# Patient Record
Sex: Female | Born: 1950
Health system: Southern US, Community
[De-identification: ages and names within clinical notes are randomized; demographics above are authoritative.]

## PROBLEM LIST (undated history)

## (undated) DIAGNOSIS — M3500 Sicca syndrome, unspecified: Secondary | ICD-10-CM

## (undated) DIAGNOSIS — E785 Hyperlipidemia, unspecified: Secondary | ICD-10-CM

## (undated) DIAGNOSIS — I10 Essential (primary) hypertension: Secondary | ICD-10-CM

## (undated) DIAGNOSIS — E669 Obesity, unspecified: Secondary | ICD-10-CM

## (undated) DIAGNOSIS — M25579 Pain in unspecified ankle and joints of unspecified foot: Secondary | ICD-10-CM

## (undated) HISTORY — DX: Hyperlipidemia, unspecified: E78.5

## (undated) HISTORY — PX: SPLENECTOMY, TOTAL: SHX788

## (undated) HISTORY — DX: Essential (primary) hypertension: I10

## (undated) HISTORY — PX: ABDOMINAL HYSTERECTOMY: SHX81

## (undated) HISTORY — PX: ABDOMINAL SURGERY: SHX537

## (undated) HISTORY — DX: Sjogren syndrome, unspecified: M35.00

## (undated) HISTORY — DX: Obesity, unspecified: E66.9

## (undated) HISTORY — PX: CHOLECYSTECTOMY: SHX55

---

## 1998-07-01 ENCOUNTER — Ambulatory Visit (HOSPITAL_COMMUNITY): Admission: RE | Admit: 1998-07-01 | Discharge: 1998-07-01 | Payer: Self-pay | Admitting: Obstetrics and Gynecology

## 1999-08-21 ENCOUNTER — Encounter: Payer: Self-pay | Admitting: Obstetrics and Gynecology

## 1999-08-21 ENCOUNTER — Ambulatory Visit (HOSPITAL_COMMUNITY): Admission: RE | Admit: 1999-08-21 | Discharge: 1999-08-21 | Payer: Self-pay | Admitting: Obstetrics and Gynecology

## 1999-08-26 ENCOUNTER — Encounter: Payer: Self-pay | Admitting: Obstetrics and Gynecology

## 1999-08-26 ENCOUNTER — Ambulatory Visit (HOSPITAL_COMMUNITY): Admission: RE | Admit: 1999-08-26 | Discharge: 1999-08-26 | Payer: Self-pay | Admitting: Obstetrics and Gynecology

## 1999-10-08 ENCOUNTER — Other Ambulatory Visit: Admission: RE | Admit: 1999-10-08 | Discharge: 1999-10-08 | Payer: Self-pay | Admitting: Obstetrics and Gynecology

## 1999-10-09 ENCOUNTER — Other Ambulatory Visit: Admission: RE | Admit: 1999-10-09 | Discharge: 1999-10-09 | Payer: Self-pay | Admitting: Obstetrics and Gynecology

## 1999-10-09 ENCOUNTER — Encounter (INDEPENDENT_AMBULATORY_CARE_PROVIDER_SITE_OTHER): Payer: Self-pay | Admitting: Specialist

## 2002-01-10 ENCOUNTER — Ambulatory Visit (HOSPITAL_COMMUNITY): Admission: RE | Admit: 2002-01-10 | Discharge: 2002-01-10 | Payer: Self-pay | Admitting: Family Medicine

## 2002-01-24 ENCOUNTER — Encounter: Payer: Self-pay | Admitting: Obstetrics and Gynecology

## 2002-01-24 ENCOUNTER — Ambulatory Visit (HOSPITAL_COMMUNITY): Admission: RE | Admit: 2002-01-24 | Discharge: 2002-01-24 | Payer: Self-pay | Admitting: Obstetrics and Gynecology

## 2002-07-24 ENCOUNTER — Other Ambulatory Visit: Admission: RE | Admit: 2002-07-24 | Discharge: 2002-07-24 | Payer: Self-pay | Admitting: Obstetrics and Gynecology

## 2003-06-25 ENCOUNTER — Ambulatory Visit (HOSPITAL_COMMUNITY): Admission: RE | Admit: 2003-06-25 | Discharge: 2003-06-25 | Payer: Self-pay | Admitting: Obstetrics and Gynecology

## 2003-09-10 ENCOUNTER — Other Ambulatory Visit: Admission: RE | Admit: 2003-09-10 | Discharge: 2003-09-10 | Payer: Self-pay | Admitting: Obstetrics and Gynecology

## 2004-07-01 ENCOUNTER — Ambulatory Visit (HOSPITAL_COMMUNITY): Admission: RE | Admit: 2004-07-01 | Discharge: 2004-07-01 | Payer: Self-pay | Admitting: Obstetrics and Gynecology

## 2004-07-18 ENCOUNTER — Ambulatory Visit: Payer: Self-pay | Admitting: Internal Medicine

## 2004-07-30 ENCOUNTER — Ambulatory Visit: Payer: Self-pay | Admitting: Internal Medicine

## 2004-11-05 ENCOUNTER — Other Ambulatory Visit: Admission: RE | Admit: 2004-11-05 | Discharge: 2004-11-05 | Payer: Self-pay | Admitting: Obstetrics and Gynecology

## 2005-07-18 ENCOUNTER — Emergency Department (HOSPITAL_COMMUNITY): Admission: EM | Admit: 2005-07-18 | Discharge: 2005-07-18 | Payer: Self-pay | Admitting: Family Medicine

## 2005-07-30 ENCOUNTER — Ambulatory Visit (HOSPITAL_COMMUNITY): Admission: RE | Admit: 2005-07-30 | Discharge: 2005-07-30 | Payer: Self-pay | Admitting: Obstetrics and Gynecology

## 2006-02-04 ENCOUNTER — Other Ambulatory Visit: Admission: RE | Admit: 2006-02-04 | Discharge: 2006-02-04 | Payer: Self-pay | Admitting: Obstetrics and Gynecology

## 2006-05-19 ENCOUNTER — Ambulatory Visit: Payer: Self-pay | Admitting: Family Medicine

## 2006-05-19 LAB — CONVERTED CEMR LAB
ALT: 23 units/L (ref 0–40)
AST: 21 units/L (ref 0–37)
BUN: 22 mg/dL (ref 6–23)
CO2: 32 meq/L (ref 19–32)
Calcium: 9.7 mg/dL (ref 8.4–10.5)
Chloride: 104 meq/L (ref 96–112)
Chol/HDL Ratio, serum: 3.2
Cholesterol: 170 mg/dL (ref 0–200)
Creatinine, Ser: 1 mg/dL (ref 0.4–1.2)
GFR calc non Af Amer: 62 mL/min
Glomerular Filtration Rate, Af Am: 75 mL/min/{1.73_m2}
Glucose, Bld: 108 mg/dL — ABNORMAL HIGH (ref 70–99)
HDL: 52.7 mg/dL (ref >39.0)
LDL Cholesterol: 100 mg/dL — ABNORMAL HIGH (ref 0–99)
Potassium: 4 meq/L (ref 3.5–5.1)
Sodium: 141 meq/L (ref 135–145)
Triglyceride fasting, serum: 89 mg/dL (ref 0–149)
VLDL: 18 mg/dL (ref 0–40)

## 2006-08-23 ENCOUNTER — Ambulatory Visit: Payer: Self-pay | Admitting: Family Medicine

## 2006-08-23 LAB — CONVERTED CEMR LAB
ALT: 24 units/L (ref 0–40)
AST: 22 units/L (ref 0–37)
BUN: 22 mg/dL (ref 6–23)
CO2: 30 meq/L (ref 19–32)
Calcium: 9.3 mg/dL (ref 8.4–10.5)
Chloride: 101 meq/L (ref 96–112)
Cholesterol: 167 mg/dL (ref 0–200)
Creatinine, Ser: 0.9 mg/dL (ref 0.4–1.2)
GFR calc Af Amer: 84 mL/min
GFR calc non Af Amer: 70 mL/min
Glucose, Bld: 111 mg/dL — ABNORMAL HIGH (ref 70–99)
HDL: 57.2 mg/dL (ref >39.0)
LDL Cholesterol: 88 mg/dL (ref 0–99)
Potassium: 3.5 meq/L (ref 3.5–5.1)
Sodium: 138 meq/L (ref 135–145)
Total CHOL/HDL Ratio: 2.9
Triglycerides: 108 mg/dL (ref 0–149)
VLDL: 22 mg/dL (ref 0–40)

## 2006-08-30 ENCOUNTER — Ambulatory Visit (HOSPITAL_COMMUNITY): Admission: RE | Admit: 2006-08-30 | Discharge: 2006-08-30 | Payer: Self-pay | Admitting: Obstetrics and Gynecology

## 2006-10-04 ENCOUNTER — Ambulatory Visit: Payer: Self-pay | Admitting: Family Medicine

## 2007-02-24 ENCOUNTER — Ambulatory Visit: Payer: Self-pay | Admitting: Family Medicine

## 2007-02-24 DIAGNOSIS — I10 Essential (primary) hypertension: Secondary | ICD-10-CM | POA: Insufficient documentation

## 2007-02-24 DIAGNOSIS — E785 Hyperlipidemia, unspecified: Secondary | ICD-10-CM | POA: Insufficient documentation

## 2007-02-25 ENCOUNTER — Encounter (INDEPENDENT_AMBULATORY_CARE_PROVIDER_SITE_OTHER): Payer: Self-pay | Admitting: *Deleted

## 2007-02-25 LAB — CONVERTED CEMR LAB
ALT: 23 units/L (ref 0–35)
AST: 22 units/L (ref 0–37)
BUN: 19 mg/dL (ref 6–23)
CO2: 33 meq/L — ABNORMAL HIGH (ref 19–32)
Calcium: 10.2 mg/dL (ref 8.4–10.5)
Chloride: 107 meq/L (ref 96–112)
Cholesterol: 167 mg/dL (ref 0–200)
Creatinine, Ser: 0.9 mg/dL (ref 0.4–1.2)
GFR calc Af Amer: 84 mL/min
GFR calc non Af Amer: 69 mL/min
Glucose, Bld: 90 mg/dL (ref 70–99)
HDL: 56.9 mg/dL (ref >39.0)
LDL Cholesterol: 88 mg/dL (ref 0–99)
Potassium: 4 meq/L (ref 3.5–5.1)
Sodium: 146 meq/L — ABNORMAL HIGH (ref 135–145)
Total CHOL/HDL Ratio: 2.9
Triglycerides: 109 mg/dL (ref 0–149)
VLDL: 22 mg/dL (ref 0–40)

## 2007-03-10 ENCOUNTER — Encounter: Admission: RE | Admit: 2007-03-10 | Discharge: 2007-03-10 | Payer: Self-pay | Admitting: Obstetrics and Gynecology

## 2007-06-27 ENCOUNTER — Ambulatory Visit: Payer: Self-pay | Admitting: Family Medicine

## 2007-06-27 ENCOUNTER — Telehealth (INDEPENDENT_AMBULATORY_CARE_PROVIDER_SITE_OTHER): Payer: Self-pay | Admitting: *Deleted

## 2007-06-27 ENCOUNTER — Encounter (INDEPENDENT_AMBULATORY_CARE_PROVIDER_SITE_OTHER): Payer: Self-pay | Admitting: *Deleted

## 2007-06-27 LAB — CONVERTED CEMR LAB
BUN: 20 mg/dL (ref 6–23)
CO2: 28 meq/L (ref 19–32)
Calcium: 9.5 mg/dL (ref 8.4–10.5)
Chloride: 102 meq/L (ref 96–112)
Creatinine, Ser: 1 mg/dL (ref 0.4–1.2)
GFR calc Af Amer: 74 mL/min
GFR calc non Af Amer: 61 mL/min
Glucose, Bld: 114 mg/dL — ABNORMAL HIGH (ref 70–99)
Potassium: 3.6 meq/L (ref 3.5–5.1)
Sodium: 140 meq/L (ref 135–145)

## 2007-09-14 ENCOUNTER — Ambulatory Visit: Payer: Self-pay | Admitting: Family Medicine

## 2007-09-20 ENCOUNTER — Encounter (INDEPENDENT_AMBULATORY_CARE_PROVIDER_SITE_OTHER): Payer: Self-pay | Admitting: *Deleted

## 2007-09-20 LAB — CONVERTED CEMR LAB
ALT: 25 units/L (ref 0–35)
AST: 24 units/L (ref 0–37)
BUN: 17 mg/dL (ref 6–23)
CO2: 30 meq/L (ref 19–32)
Calcium: 9.9 mg/dL (ref 8.4–10.5)
Chloride: 103 meq/L (ref 96–112)
Creatinine, Ser: 1 mg/dL (ref 0.4–1.2)
GFR calc Af Amer: 74 mL/min
GFR calc non Af Amer: 61 mL/min
Glucose, Bld: 75 mg/dL (ref 70–99)
Potassium: 4.2 meq/L (ref 3.5–5.1)
Sodium: 140 meq/L (ref 135–145)

## 2007-10-05 ENCOUNTER — Encounter: Admission: RE | Admit: 2007-10-05 | Discharge: 2007-10-05 | Payer: Self-pay | Admitting: Obstetrics and Gynecology

## 2007-10-17 ENCOUNTER — Telehealth (INDEPENDENT_AMBULATORY_CARE_PROVIDER_SITE_OTHER): Payer: Self-pay | Admitting: *Deleted

## 2007-10-17 ENCOUNTER — Ambulatory Visit: Payer: Self-pay | Admitting: Family Medicine

## 2007-10-17 DIAGNOSIS — M25579 Pain in unspecified ankle and joints of unspecified foot: Secondary | ICD-10-CM

## 2007-10-17 HISTORY — DX: Pain in unspecified ankle and joints of unspecified foot: M25.579

## 2007-10-18 ENCOUNTER — Ambulatory Visit: Payer: Self-pay | Admitting: Family Medicine

## 2007-10-19 ENCOUNTER — Telehealth (INDEPENDENT_AMBULATORY_CARE_PROVIDER_SITE_OTHER): Payer: Self-pay | Admitting: *Deleted

## 2007-12-15 ENCOUNTER — Ambulatory Visit: Payer: Self-pay | Admitting: Internal Medicine

## 2008-01-30 ENCOUNTER — Emergency Department (HOSPITAL_COMMUNITY): Admission: EM | Admit: 2008-01-30 | Discharge: 2008-01-30 | Payer: Self-pay | Admitting: Emergency Medicine

## 2008-02-16 ENCOUNTER — Emergency Department (HOSPITAL_COMMUNITY): Admission: EM | Admit: 2008-02-16 | Discharge: 2008-02-16 | Payer: Self-pay | Admitting: Emergency Medicine

## 2008-04-23 ENCOUNTER — Encounter (INDEPENDENT_AMBULATORY_CARE_PROVIDER_SITE_OTHER): Payer: Self-pay | Admitting: *Deleted

## 2008-07-19 ENCOUNTER — Emergency Department (HOSPITAL_COMMUNITY): Admission: EM | Admit: 2008-07-19 | Discharge: 2008-07-19 | Payer: Self-pay | Admitting: Emergency Medicine

## 2008-11-27 ENCOUNTER — Ambulatory Visit (HOSPITAL_COMMUNITY): Admission: RE | Admit: 2008-11-27 | Discharge: 2008-11-27 | Payer: Self-pay | Admitting: Obstetrics and Gynecology

## 2009-01-19 ENCOUNTER — Emergency Department (HOSPITAL_COMMUNITY): Admission: EM | Admit: 2009-01-19 | Discharge: 2009-01-19 | Payer: Self-pay | Admitting: Family Medicine

## 2010-02-26 ENCOUNTER — Ambulatory Visit (HOSPITAL_COMMUNITY): Admission: RE | Admit: 2010-02-26 | Discharge: 2010-02-26 | Payer: Self-pay | Admitting: Obstetrics and Gynecology

## 2010-08-14 NOTE — Progress Notes (Signed)
  Phone Note Outgoing Call Call back at [facility] Phone [phone]   Call placed by: Ardyth Man,  June 27, 2007 3:15 PM Call placed to: Patient Summary of Call: Left message for patient and mailed lab letter ...................................................................Ardyth Man  June 27, 2007 3:15 PM

## 2010-08-14 NOTE — Letter (Signed)
Summary: Results Follow up Letter  [facility] [place]  [address] [place], [place] [address]   Phone: [phone]  Fax: [phone]    09/20/2007 MRN: [id number]  Kristy Sherman [address] [place], [place]  [address]  Dear [name],  The following are the results of your recent test(s):  Test         Result    Pap Smear:        Normal _____  Not Normal _____ Comments: ______________________________________________________ Cholesterol: LDL(Bad cholesterol):         Your goal is less than:         HDL (Good cholesterol):       Your goal is more than: Comments:  ______________________________________________________ Mammogram:        Normal _____  Not Normal _____ Comments:  ___________________________________________________________________ Hemoccult:        Normal _____  Not normal _______ Comments:    _____________________________________________________________________ Other Tests:  LAB NORMAL!  We routinely do not discuss normal results over the telephone.  If you desire a copy of the results, or you have any questions about this information we can discuss them at your next office visit.   Sincerely,

## 2010-08-14 NOTE — Assessment & Plan Note (Signed)
Summary: 4 MONTH ROA//VGJ   Vital Signs:  Patient Profile:   60 Years Old Female Weight:      223 pounds Temp:     98.2 degrees F oral Pulse rate:   60 / minute Resp:     14 per minute BP sitting:   108 / 72  (right arm)  Pt. in pain?   no  Vitals Entered By: Ardyth Man (June 27, 2007 7:59 AM)                  Chief Complaint:  4 month follow up BP.  History of Present Illness:  Hypertension Follow-Up      This is a 60 year old woman who presents for Hypertension follow-up.  The patient denies the following associated symptoms: chest pain, chest pressure, leg edema, dizziness,  and pedal edema.  Compliance with medications (by patient report) has been near 100%.  The patient reports that dietary compliance has been excellent.  The patient reports exercising occasionally.    Hyperlipidemia Follow-Up      The patient also presents for Hyperlipidemia follow-up.  Compliance with medications (by patient report) has been near 100%.          Physical Exam  General:     overweight female in no acute distress and answers questions appropriately Neck:     No deformities, masses, or tenderness noted. Lungs:     Normal respiratory effort, chest expands symmetrically. Lungs are clear to auscultation, no crackles or wheezes. Heart:     Normal rate and regular rhythm. S1 and S2 normal without gallop, murmur, click, rub or other extra sounds. Extremities:     trace left pedal edema and trace right pedal edema.      Impression & Recommendations:  Problem # 1:  HYPERTENSION (ICD-401.9) At goal Her updated medication list for this problem includes:    Norvasc 10 Mg Tabs (Amlodipine besylate) .Marland Kitchen... Take 1 tablet by mouth once a day    Hydrochlorothiazide 25 Mg Tabs (Hydrochlorothiazide) .Marland Kitchen... Take 1 tablet by mouth once a day F/u in 3 months and prn Orders: TLB-BMP (Basic Metabolic Panel-BMET) (80048-METABOL) Venipuncture ([address])  BP today: 108/72 Prior BP:  118/80 (02/24/2007)  Labs Reviewed: Creat: 0.9 (02/24/2007) Chol: 167 (02/24/2007)   HDL: 56.9 (02/24/2007)   LDL: 88 (02/24/2007)   TG: 109 (02/24/2007)   Problem # 2:  HYPERLIPIDEMIA (ICD-272.4) Previously at goal Her updated medication list for this problem includes:    Simvastatin 20 Mg Tabs (Simvastatin) .Marland Kitchen... Take 1 tablet by mouth every night  Labs Reviewed: Chol: 167 (02/24/2007)   HDL: 56.9 (02/24/2007)   LDL: 88 (02/24/2007)   TG: 109 (02/24/2007) SGOT: 22 (02/24/2007)   SGPT: 23 (02/24/2007)   Complete Medication List: 1)  Norvasc 10 Mg Tabs (Amlodipine besylate) .... Take 1 tablet by mouth once a day 2)  Simvastatin 20 Mg Tabs (Simvastatin) .... Take 1 tablet by mouth every night 3)  Aspirin Ec 81 Mg Tbec (Aspirin) .... Take 1 tablet by mouth every morning 4)  Hydrochlorothiazide 25 Mg Tabs (Hydrochlorothiazide) .... Take 1 tablet by mouth once a day 5)  Co Q-10 Vitamin E Fish Oil 60-90-25-200 Caps (Dha-epa-coenzyme q10-vitamin e)     Prescriptions: HYDROCHLOROTHIAZIDE 25 MG TABS (HYDROCHLOROTHIAZIDE) Take 1 tablet by mouth once a day  #90 x 3   Entered and Authorized by:   Leanne Chang MD   Signed by:   Leanne Chang MD on 06/27/2007   Method used:   Print  then Give to Patient   RxID:   [id number] SIMVASTATIN 20 MG TABS (SIMVASTATIN) Take 1 tablet by mouth every night  #90 x 3   Entered and Authorized by:   Leanne Chang MD   Signed by:   Leanne Chang MD on 06/27/2007   Method used:   Print then Give to Patient   RxID:   [id number] NORVASC 10 MG TABS (AMLODIPINE BESYLATE) Take 1 tablet by mouth once a day  #90 x 3   Entered and Authorized by:   Leanne Chang MD   Signed by:   Leanne Chang MD on 06/27/2007   Method used:   Print then Give to Patient   RxID:   [id number]  ]

## 2010-08-14 NOTE — Letter (Signed)
Summary: [place] No Show Letter  [facility] [place]  [address] [place], [place] [address]   Phone: [phone]  Fax: [phone]    04/23/2008 MRN: [id number]  Kristy Sherman [address] [place], [place]  [address]   Dear Kristy Sherman,   Our records indicate that you missed your scheduled appointment with Dr. Drue Novel on 04/23/08.  Please contact this office to reschedule your appointment as soon as possible.  It is important that you keep your scheduled appointments with your physician, so we can provide you the best care possible.  Please be advised that there may be a charge for "no show" appointments.    Sincerely,   [place] [facility]

## 2010-08-14 NOTE — Letter (Signed)
Summary: Results Follow-up Letter  [name]  [address] [place], [place] [address]   Phone: [phone]  Fax: [phone]    06/27/2007        Kristy Sherman [address] [place] [place], [place]  [address]  Dear [name],   The following are the results of your recent test(s):  Test     Result     Pap Smear    Normal_______  Not Normal_____       Comments: _________________________________________________________ Cholesterol LDL(Bad cholesterol):          Your goal is less than:         HDL (Good cholesterol):        Your goal is more than: _________________________________________________________ Other Tests:   _________________________________________________________  Please call for an appointment Or _Please see attached.________________________________________________________ _________________________________________________________ _________________________________________________________  Sincerely,  Ardyth Man [place] [facility]

## 2010-08-14 NOTE — Assessment & Plan Note (Signed)
Summary: acute/ankle pain   Vital Signs:  Patient Profile:   60 Years Old Female Weight:      216.38 pounds Temp:     98.0 degrees F oral Pulse rate:   74 / minute Resp:     16 per minute BP sitting:   110 / 80  (right arm)  Pt. in pain?   yes    Location:   ankle    Intensity:   4    Type:       Pulling  Vitals Entered By: Ardyth Man (October 17, 2007 3:41 PM)                  PCP:  Laury Axon  Chief Complaint:  Left ankle pain.  History of Present Illness:  Injury      This is a 60 year old woman who presents with An injury.  The problem began duration > 3 days ago.  Pt noticed pain in L ankle for 1 1/2 weeks.  Pt was at work walking on carpet and she felt a pop in front of ankle.    No ecchymosis  + swelling.  The patient reports injury to the left ankle, but denies injury to the head, face, neck, left arm, right arm, left elbow, right elbow, left forearm, right forearm, chest, back, abdomen, left hip, right hip, left thigh, right thigh, left knee, right knee, left leg, right leg, right ankle, left foot, and right foot.  The patient also reports swelling and tenderness.  The patient denies redness, increased warmth deformity, blood loss, numbness, weakness, loss of sensation, coolness of extremity, and loss of consciousness.  The patient denies the following risk factors for significant bleeding: aspirin use, anticoagulant use, and history of bleeding disorder.--- no known injury      Current Allergies: No known allergies      Review of Systems      See HPI   Physical Exam  General:     Well-developed,well-nourished,in no acute distress; alert,appropriate and cooperative throughout examination Msk:     + swelling L ankle not warm to touch no errythema tenderness anteriorly Skin:     Intact without suspicious lesions or rashes    Impression & Recommendations:  Problem # 1:  ANKLE PAIN, LEFT (ICD-719.47) rest, elevation , ice ,  ankle  splint Orders: T-Ankle Comp Left Min 3 Views (73610TC) Ankle / Wrist Splint (A4570)-- ortho if no better  Orders: T-Ankle Comp Left Min 3 Views (73610TC) Ankle / Wrist Splint (A4570)   Complete Medication List: 1)  Norvasc 10 Mg Tabs (Amlodipine besylate) .... Take 1 tablet by mouth once a day 2)  Simvastatin 20 Mg Tabs (Simvastatin) .... Take 1 tablet by mouth every night 3)  Aspirin Ec 81 Mg Tbec (Aspirin) .... Take 1 tablet by mouth every morning 4)  Hydrochlorothiazide 25 Mg Tabs (Hydrochlorothiazide) .... Take 1 tablet by mouth once a day 5)  Co Q-10 Vitamin E Fish Oil 60-90-25-200 Caps (Dha-epa-coenzyme q10-vitamin e) 6)  Pepcid Ac 10 Mg Tabs (Famotidine) .... Take one tablet each evening     ]

## 2010-08-14 NOTE — Assessment & Plan Note (Signed)
Summary: roa 3 months,cbs   Vital Signs:  Patient Profile:   59 Years Old Female Weight:      213.6 pounds Pulse rate:   82 / minute BP sitting:   102 / 70  Vitals Entered By: Shary Decamp (December 15, 2007 8:49 AM)                 PCP:  Laury Axon  Chief Complaint:  rov - fasting; c/o swelling in ankles.  History of Present Illness: rov - fasting c/o swelling in feet at the end of the day x years on-off, more consistently x 2 -3 months on norvasc x years     Updated Prior Medication List: NORVASC 10 MG TABS (AMLODIPINE BESYLATE) Take 1 tablet by mouth once a day SIMVASTATIN 20 MG TABS (SIMVASTATIN) Take 1 tablet by mouth every night ASPIRIN EC 81 MG TBEC (ASPIRIN) Take 1 tablet by mouth every morning HYDROCHLOROTHIAZIDE 25 MG TABS (HYDROCHLOROTHIAZIDE) Take 1 tablet by mouth once a day PEPCID AC 10 MG  TABS (FAMOTIDINE) Take one tablet each evening * FISH OIL   Current Allergies (reviewed today): No known allergies   Past Medical History:    Reviewed history from 02/24/2007 and no changes required:       Hyperlipidemia       Hypertension         Past Surgical History:    Reviewed history from 02/24/2007 and no changes required:       c/s 3   Family History:    Reviewed history from 02/24/2007 and no changes required:        Asthma-- +       CAD - F       HTN - F       DM - PGM, MGF       stroke - PGM       colon Ca - MGM       breast Ca - no       uterine/ovarian Ca - no       ETOH abuse - M       F --deceased sudden death         Social History:    Reviewed history from 02/24/2007 and no changes required:       Occupation: Nurse       Married       Never Smoked       Alcohol use-yes       Drug use-no       Regular exercise-yes   Risk Factors: Tobacco use:  never Drug use:  no Alcohol use:  yes Exercise:  yes   Review of Systems  CV      Denies chest pain or discomfort.  Resp      Denies cough and shortness of breath.  GI  Denies diarrhea.   Physical Exam  General:     alert, well-developed, and overweight-appearing.   Neck:     no thyromegaly.   Lungs:     normal respiratory effort, no intercostal retractions, no accessory muscle use, and normal breath sounds.   Heart:     normal rate, regular rhythm, and no murmur.   Extremities:     no pretibial edema bilaterally  Psych:     Oriented X3, memory intact for recent and remote, normally interactive, good eye contact, not anxious appearing, and not depressed appearing.      Impression & Recommendations:  Problem # 1:  HYPERTENSION (ICD-401.9) edema  may be from norvasc pt states is not  enough to change  meds rec: low salt-compression stokings-elevate legs to call if worsen: change norvasc? Her updated medication list for this problem includes:    Norvasc 10 Mg Tabs (Amlodipine besylate) .Marland Kitchen... Take 1 tablet by mouth once a day    Hydrochlorothiazide 25 Mg Tabs (Hydrochlorothiazide) .Marland Kitchen... Take 1 tablet by mouth once a day  BP today: 102/70 Prior BP: 110/80 (10/17/2007)  Labs Reviewed: Creat: 1.0 (09/14/2007) Chol: 167 (02/24/2007)   HDL: 56.9 (02/24/2007)   LDL: 88 (02/24/2007)   TG: 109 (02/24/2007)   Problem # 2:  HYPERLIPIDEMIA (ICD-272.4) Assessment: Unchanged  Her updated medication list for this problem includes:    Simvastatin 20 Mg Tabs (Simvastatin) .Marland Kitchen... Take 1 tablet by mouth every night  Labs Reviewed: Chol: 167 (02/24/2007)   HDL: 56.9 (02/24/2007)   LDL: 88 (02/24/2007)   TG: 109 (02/24/2007) SGOT: 24 (09/14/2007)   SGPT: 25 (09/14/2007)   Problem # 3:  HEALTH SCREENING (ICD-V70.0) sees Gyn Cscope aprox 2006 per pt (Dr Westley Gambles) due for CPX--pt aware  Complete Medication List: 1)  Norvasc 10 Mg Tabs (Amlodipine besylate) .... Take 1 tablet by mouth once a day 2)  Simvastatin 20 Mg Tabs (Simvastatin) .... Take 1 tablet by mouth every night 3)  Aspirin Ec 81 Mg Tbec (Aspirin) .... Take 1 tablet by mouth every morning 4)   Hydrochlorothiazide 25 Mg Tabs (Hydrochlorothiazide) .... Take 1 tablet by mouth once a day 5)  Pepcid Ac 10 Mg Tabs (Famotidine) .... Take one tablet each evening 6)  Fish Oil    Patient Instructions: 1)  Please schedule a follow-up appointment in 4 months (fasting-physical)   ]

## 2010-08-14 NOTE — Progress Notes (Signed)
Summary: [facility] 10/19/07  Phone Note Outgoing Call Call back at Home Phone [phone]   Call placed by: Ardyth Man,  October 19, 2007 7:29 AM Call placed to: Patient Summary of Call: Left message for patient to call office.  ie: no fracture ...................................................................Ardyth Man  October 19, 2007 7:29 AM   Follow-up for Phone Call        Patient aware ...................................................................Ardyth Man  October 19, 2007 1:16 PM  Follow-up by: Ardyth Man,  October 19, 2007 1:16 PM

## 2010-08-14 NOTE — Letter (Signed)
Summary: Results Follow up Letter  [facility] [place]  [address] [place], [place] [address]   Phone: [phone]  Fax: [phone]    02/25/2007 MRN: [id number]  Kristy Sherman [address] [place], [place]  [address]  Dear [name],  The following are the results of your recent test(s):  Test         Result    Pap Smear:        Normal _____  Not Normal _____ Comments: ______________________________________________________ Cholesterol: LDL(Bad cholesterol):         Your goal is less than:         HDL (Good cholesterol):       Your goal is more than: Comments:  ______________________________________________________ Mammogram:        Normal _____  Not Normal _____ Comments:  ___________________________________________________________________ Hemoccult:        Normal _____  Not normal _______ Comments:    _____________________________________________________________________ Other Tests: Overall labs okay see attached.   We routinely do not discuss normal results over the telephone.  If you desire a copy of the results, or you have any questions about this information we can discuss them at your next office visit.   Sincerely,

## 2010-08-14 NOTE — Progress Notes (Signed)
Summary: Michelle--Returning a Call  Phone Note Call from Patient   Summary of Call: Pt was returning your call and she said that she will call you back when she has a chance. Would not leave a number for you to call her back. Initial call taken by: Freddy Jaksch,  October 19, 2007 12:37 PM  Follow-up for Phone Call        See phone note for 10/18/07 ...................................................................Ardyth Man  October 19, 2007 1:17 PM  Follow-up by: Ardyth Man,  October 19, 2007 1:17 PM

## 2010-08-14 NOTE — Assessment & Plan Note (Signed)
Summary: ROV 4 MONTH.CBS   Vital Signs:  Patient Profile:   60 Years Old Female Weight:      222 pounds Pulse rate:   60 / minute BP sitting:   118 / 80  (left arm)  Vitals Entered By: Doristine Devoid (February 24, 2007 1:37 PM)               Chief Complaint:  4 month ROA.  History of Present Illness:  Hypertension Follow-Up      This is a 60 year old woman who presents for Hypertension follow-up.  The patient denies lightheadedness, urinary frequency, edema, and fatigue.  The patient denies the following associated symptoms: chest pain, chest pressure, and exercise intolerance.  Compliance with medications (by patient report) has been near 100%.  The patient reports that dietary compliance has been good.  The patient reports exercising 3-4X per week.    Hyperlipidemia Follow-Up      The patient also presents for Hyperlipidemia follow-up.  The patient denies muscle aches and GI upset.  The patient denies the following symptoms: chest pain/pressure.  Compliance with medications (by patient report) has been near 100%.  Dietary compliance has been good.  Adjunctive measures currently used by the patient include ASA.      Past Medical History:    Hyperlipidemia    Hypertension  Past Surgical History:    c/s 3   Family History:    Family History of Asthma.      Social History:    Occupation: Engineer, civil (consulting)    Married    Never Smoked    Alcohol use-yes    Drug use-no    Regular exercise-yes   Risk Factors:  Tobacco use:  never Drug use:  no Alcohol use:  yes Exercise:  yes    Physical Exam  General:     overweight female in no acute distress and answers questions appropriately Neck:     No deformities, masses, or tenderness noted. Lungs:     Normal respiratory effort, chest expands symmetrically. Lungs are clear to auscultation, no crackles or wheezes. Heart:     Normal rate and regular rhythm. S1 and S2 normal without gallop, murmur, click, rub or other extra  sounds. Pulses:     R and L carotid,radial,dorsalis pedis and posterior tibial pulses are full and equal bilaterally Extremities:     trace left pedal edema and trace right pedal edema.      Impression & Recommendations:  Problem # 1:  HYPERTENSION (ICD-401.9) at goal Continue current medication regimen and follow-up in 3 to 4 months. Her updated medication list for this problem includes:    Norvasc 10 Mg Tabs (Amlodipine besylate) .Marland Kitchen... Take 1 tablet by mouth once a day    Hydrochlorothiazide 25 Mg Tabs (Hydrochlorothiazide) .Marland Kitchen... Take 1 tablet by mouth once a day  BP today: 118/80  Labs Reviewed: Creat: 0.9 (08/23/2006) Chol: 167 (08/23/2006)   HDL: 57.2 (08/23/2006)   LDL: 88 (08/23/2006)   TG: 108 (08/23/2006)  Orders: TLB-Lipid Panel (80061-LIPID) TLB-ALT (SGPT) (84460-ALT) TLB-AST (SGOT) (84450-SGOT) TLB-BMP (Basic Metabolic Panel-BMET) (80048-METABOL)   Problem # 2:  HYPERLIPIDEMIA (ICD-272.4) previously at goal. Check labs today. Her updated medication list for this problem includes:    Simvastatin 20 Mg Tabs (Simvastatin) .Marland Kitchen... Take 1 tablet by mouth every night  Labs Reviewed: Chol: 167 (08/23/2006)   HDL: 57.2 (08/23/2006)   LDL: 88 (08/23/2006)   TG: 108 (08/23/2006) SGOT: 22 (08/23/2006)   SGPT: 24 (08/23/2006)   Complete  Medication List: 1)  Norvasc 10 Mg Tabs (Amlodipine besylate) .... Take 1 tablet by mouth once a day 2)  Simvastatin 20 Mg Tabs (Simvastatin) .... Take 1 tablet by mouth every night 3)  Aspirin Ec 81 Mg Tbec (Aspirin) .... Take 1 tablet by mouth every morning 4)  Hydrochlorothiazide 25 Mg Tabs (Hydrochlorothiazide) .... Take 1 tablet by mouth once a day

## 2010-10-19 LAB — URINE CULTURE: Colony Count: >=100000

## 2010-10-19 LAB — POCT URINALYSIS DIP (DEVICE)
Bilirubin Urine: NEGATIVE
Glucose, UA: NEGATIVE mg/dL
Ketones, ur: NEGATIVE mg/dL
Nitrite: POSITIVE — AB
Protein, ur: 100 mg/dL — AB
Specific Gravity, Urine: 1.02 (ref 1.005–1.030)
Urobilinogen, UA: 1 mg/dL (ref 0.0–1.0)
pH: 6 (ref 5.0–8.0)

## 2010-12-25 ENCOUNTER — Inpatient Hospital Stay (INDEPENDENT_AMBULATORY_CARE_PROVIDER_SITE_OTHER)
Admission: RE | Admit: 2010-12-25 | Discharge: 2010-12-25 | Disposition: A | Discharge status: Home / Self Care | Payer: 59 | Source: Ambulatory Visit | Attending: Emergency Medicine | Admitting: Emergency Medicine

## 2010-12-25 DIAGNOSIS — J029 Acute pharyngitis, unspecified: Secondary | ICD-10-CM

## 2010-12-25 LAB — POCT RAPID STREP A: Streptococcus, Group A Screen (Direct): NEGATIVE

## 2010-12-30 ENCOUNTER — Inpatient Hospital Stay (INDEPENDENT_AMBULATORY_CARE_PROVIDER_SITE_OTHER)
Admission: RE | Admit: 2010-12-30 | Discharge: 2010-12-30 | Disposition: A | Discharge status: Home / Self Care | Payer: 59 | Source: Ambulatory Visit | Attending: Family Medicine | Admitting: Family Medicine

## 2010-12-30 DIAGNOSIS — J069 Acute upper respiratory infection, unspecified: Secondary | ICD-10-CM

## 2011-02-09 ENCOUNTER — Other Ambulatory Visit (HOSPITAL_COMMUNITY): Payer: Self-pay | Admitting: Obstetrics and Gynecology

## 2011-02-09 DIAGNOSIS — Z1231 Encounter for screening mammogram for malignant neoplasm of breast: Secondary | ICD-10-CM

## 2011-03-02 ENCOUNTER — Ambulatory Visit (HOSPITAL_COMMUNITY)
Admission: RE | Admit: 2011-03-02 | Discharge: 2011-03-02 | Disposition: A | Discharge status: Home / Self Care | Payer: 59 | Source: Ambulatory Visit | Attending: Obstetrics and Gynecology | Admitting: Obstetrics and Gynecology

## 2011-03-02 DIAGNOSIS — Z1231 Encounter for screening mammogram for malignant neoplasm of breast: Secondary | ICD-10-CM | POA: Insufficient documentation

## 2011-04-10 LAB — CBC
HCT: 40.3
Hemoglobin: 13.6
MCHC: 33.7
MCV: 92.5
Platelets: 203
RBC: 4.36
RDW: 13.5
WBC: 3.9 — ABNORMAL LOW

## 2011-04-10 LAB — DIFFERENTIAL
Basophils Absolute: 0
Basophils Relative: 0
Eosinophils Absolute: 0
Eosinophils Relative: 0
Lymphocytes Relative: 15
Lymphs Abs: 0.6 — ABNORMAL LOW
Monocytes Absolute: 0.4
Monocytes Relative: 11
Neutro Abs: 2.9
Neutrophils Relative %: 73

## 2011-08-26 ENCOUNTER — Encounter: Payer: Self-pay | Admitting: General Practice

## 2012-02-03 ENCOUNTER — Encounter: Payer: Self-pay | Attending: "Endocrinology | Admitting: *Deleted

## 2012-02-03 DIAGNOSIS — Z713 Dietary counseling and surveillance: Secondary | ICD-10-CM

## 2012-02-09 NOTE — Progress Notes (Signed)
Patient attended the [facility]: Hypertension/High Cholesterol nutrition class on 02/03/12.  Topics covered include:   1. Complications of Hyperlipidemia and/or Hypertension. 2. Ways to reduce risk of heart disease.  3. Identifying fat and sodium content on food labels. 4. Ways to decrease sodium intake. 5. Optimal amount of daily saturated fat intake. 6. Optimal amount of daily sodium intake.  7. Foods to limit/avoid on a heart healthy diet.  Patient to follow-up with [facility] prn.  

## 2012-02-17 ENCOUNTER — Other Ambulatory Visit: Payer: Self-pay | Admitting: Obstetrics and Gynecology

## 2012-02-17 DIAGNOSIS — Z1231 Encounter for screening mammogram for malignant neoplasm of breast: Secondary | ICD-10-CM

## 2012-03-03 ENCOUNTER — Ambulatory Visit (HOSPITAL_COMMUNITY)
Admission: RE | Admit: 2012-03-03 | Discharge: 2012-03-03 | Disposition: A | Discharge status: Home / Self Care | Payer: 59 | Source: Ambulatory Visit | Attending: Obstetrics and Gynecology | Admitting: Obstetrics and Gynecology

## 2012-03-03 DIAGNOSIS — Z1231 Encounter for screening mammogram for malignant neoplasm of breast: Secondary | ICD-10-CM | POA: Insufficient documentation

## 2012-04-15 ENCOUNTER — Encounter: Payer: Self-pay | Admitting: Internal Medicine

## 2012-04-19 ENCOUNTER — Encounter: Payer: 59 | Attending: Internal Medicine | Admitting: *Deleted

## 2012-04-19 ENCOUNTER — Encounter: Payer: Self-pay | Admitting: *Deleted

## 2012-04-19 VITALS — Ht 66.0 in | Wt 222.5 lb

## 2012-04-19 DIAGNOSIS — Z713 Dietary counseling and surveillance: Secondary | ICD-10-CM | POA: Insufficient documentation

## 2012-04-19 DIAGNOSIS — E669 Obesity, unspecified: Secondary | ICD-10-CM | POA: Insufficient documentation

## 2012-04-19 NOTE — Progress Notes (Signed)
  Medical Nutrition Therapy:  Appt start time: 0900 end time:  1000.   Assessment:  Primary concerns today: obesity.   MEDICATIONS: see list   DIETARY INTAKE:  Usual eating pattern includes 3 meals and 0-1 snacks per day.  Everyday foods include proteins, starches, some vegetables.  Avoided foods include none.    24-hr recall:  B ( AM): grits and egg and egg white with [place] sausage or liver pudding; oatmeal.  Coffee with splenda and fat free creamer  Snk ( AM): none  L ( PM): lean cusine or cafeteria vegetables with grilled chicken or salad bar. water Snk ( PM): none- cup coffee D ( PM): kale and navy bean pasta with oven breaded shrimp; japanese food (shrimp, fried rice, and vegetables); fried chicken or chick fil a; spaghetti Snk ( PM): none Loves potato chips Beverages: water and coffee  Usual physical activity: walking or goes to fitness center at work 3 days a week  Estimated energy needs: 1400-1500 calories 158 g carbohydrates 105 g protein 39 g fat  Progress Towards Goal(s):  In progress.     Nutritional Diagnosis:  [place]-3.3 Overweight/obesity As related to constant dieting, fast food choices, and limited activity.  As evidenced by BMI of 36 .    Intervention:  Nutrition counseling provided.  Tahesha is here for weight management education.  She has been dieting most of her life and believes that she has no will power because she's not able to stick to these diets.  Discussed that diets are very restrictive and not designed to last long-term and that she is not a failure for gaining weight back.  Encouraged her to reject that diet mentality and to reject those feelings of guilt over the choices she's made.  Dicussed food as fuel for her body.  We need fuel to survive and when we restrict food/fuel too much, our bodies slow down the metabolic rate.  Encouraged 3 meals a day and to avoid meal skipping.  Discussed MyPlate recommendations for healthy eating: lean protein,  complex carbohydrates, and more vegetables for healthy benefits.  Encouraged Arwa to focus on health rather than weight loss as a motivator for her changes.  Encouraged more water for proper hydration and more activity for overall health benefits.  Encouraged intuitive eating: listening to her body's hunger and satiety cues and eating in response to those cues.   Monitoring/Evaluation:  Dietary intake, exercise, and body weight in 1 month(s).

## 2012-04-19 NOTE — Patient Instructions (Addendum)
Goals:  Eat 3 meals/day, Avoid meal skipping   Increase protein rich foods  Follow "Plate Method" for portion control  Limit carbohydrate1-2 servings/meal   Choose more whole grains, lean protein, low-fat dairy, and fruits/non-starchy vegetables.   Aim for >20 min of physical activity daily  Listen to body- eat when hungry- stop when full.  Make meals last at least 25 minutes  Don't beat yourself for the food choices you make  Choose more water

## 2012-05-16 ENCOUNTER — Ambulatory Visit: Payer: 59 | Admitting: *Deleted

## 2012-07-28 ENCOUNTER — Encounter: Payer: Self-pay | Admitting: Obstetrics and Gynecology

## 2012-07-28 ENCOUNTER — Ambulatory Visit: Payer: 59 | Admitting: Obstetrics and Gynecology

## 2012-07-28 VITALS — BP 110/78 | HR 70 | Ht 66.0 in | Wt 224.0 lb

## 2012-07-28 DIAGNOSIS — Z01419 Encounter for gynecological examination (general) (routine) without abnormal findings: Secondary | ICD-10-CM

## 2012-07-28 DIAGNOSIS — Z124 Encounter for screening for malignant neoplasm of cervix: Secondary | ICD-10-CM

## 2012-07-28 MED ORDER — ESTRADIOL 10 MCG VA TABS: 10.0000 ug | ORAL_TABLET | VAGINAL | Status: DC

## 2012-07-28 NOTE — Progress Notes (Signed)
Subjective:    Kristy Sherman is a 62 y.o. female G3P3 who presents for annual exam. The patient complaints of vaginal dryness. She was diagnosed with Sjgren syndrome.  Things are better at home.  Less stress.  The following portions of the patient's history were reviewed and updated as appropriate: allergies, current medications, past family history, past medical history, past social history, past surgical history and problem list.  Review of Systems Pertinent items are noted in HPI. Gastrointestinal:No change in bowel habits, no abdominal pain, no rectal bleeding Genitourinary:negative for dysuria, frequency, hematuria, nocturia and urinary incontinence    Objective:     BP 110/78  Pulse 70  Ht 5\' 6"  (1.676 m)  Wt 224 lb (101.606 kg)  BMI 36.15 kg/m2  Weight:  Wt Readings from Last 1 Encounters:  07/28/12 224 lb (101.606 kg)     BMI: Body mass index is 36.15 kg/(m^2). General Appearance: Alert, appropriate appearance for age. No acute distress HEENT: Grossly normal Neck / Thyroid: Supple, no masses, nodes or enlargement Lungs: clear to auscultation bilaterally Back: No CVA tenderness Breast Exam: No masses or nodes.No dimpling, nipple retraction or discharge. Cardiovascular: Regular rate and rhythm. S1, S2, no murmur Gastrointestinal: Soft, non-tender, no masses or organomegaly  ++++++++++++++++++++++++++++++++++++++++++++++++++++++++  Pelvic Exam: External genitalia: normal general appearance Vaginal: normal without tenderness, induration or masses and relaxation noted Cervix: normal appearance Adnexa: normal bimanual exam Uterus: normal size and shape Rectovaginal: normal rectal, no masses  ++++++++++++++++++++++++++++++++++++++++++++++++++++++++  Lymphatic Exam: Non-palpable nodes in neck, clavicular, axillary, or inguinal regions  Psychiatric: Alert and oriented, appropriate affect.      Assessment:    Normal gyn exam   Overweight or obese: Yes  Pelvic  relaxation: Yes  Menopausal symptoms: Yes. Severe: Yes.  Vaginal dryness  Sjgren syndrome   Plan:    Mammogram. Pap smear. colonoscopy   Follow-up:  for annual exam  The updated Pap smear screening guidelines were discussed with the patient. The patient requested that I obtain a Pap smear: Yes.  Kegel exercises discussed: Yes.  Vagifem 10 g twice each week.  Proper diet and regular exercise were reviewed.  Annual mammograms recommended starting at age 103. Proper breast care was discussed.  Screening colonoscopy is recommended beginning at age 48.  Regular health maintenance was reviewed.  Sleep hygiene was discussed.  Adequate calcium and vitamin D intake was emphasized.  Leonard Schwartz M.D.   Regular Periods: no Mammogram: yes  Monthly Breast Ex.: no Exercise: yes  Tetanus < 10 years: yes Seatbelts: yes  NI. Bladder Functn.: yes Abuse at home: no  Daily BM's: no pt states it is regular but not daily Stressful Work: yes  Healthy Diet: yes Sigmoid-Colonoscopy: 8 years ago  Calcium: no Medical problems this year: none   LAST PAP: 01/07/09 wnl   Contraception: postmenopausal   Mammogram:  02/2012  PCP: Dr. Earl Gala  PMH:  No changes  FMH: no changes   Last Bone Scan: n/a

## 2012-08-01 LAB — PAP IG W/ RFLX HPV ASCU

## 2012-08-27 ENCOUNTER — Other Ambulatory Visit: Payer: Self-pay

## 2012-11-10 DIAGNOSIS — K5730 Diverticulosis of large intestine without perforation or abscess without bleeding: Secondary | ICD-10-CM

## 2013-02-01 ENCOUNTER — Encounter: Payer: Self-pay | Admitting: Internal Medicine

## 2013-02-01 ENCOUNTER — Encounter: Payer: Self-pay | Admitting: Gastroenterology

## 2013-04-05 ENCOUNTER — Ambulatory Visit (AMBULATORY_SURGERY_CENTER): Payer: Self-pay | Admitting: *Deleted

## 2013-04-05 VITALS — Ht 66.0 in | Wt 225.6 lb

## 2013-04-05 DIAGNOSIS — Z1211 Encounter for screening for malignant neoplasm of colon: Secondary | ICD-10-CM

## 2013-04-05 MED ORDER — MOVIPREP 100 G PO SOLR: ORAL | Status: DC

## 2013-04-05 NOTE — Progress Notes (Signed)
No allergies to eggs or soy. No problems with anesthesia.  

## 2013-04-19 ENCOUNTER — Ambulatory Visit (AMBULATORY_SURGERY_CENTER): Payer: 59 | Admitting: Internal Medicine

## 2013-04-19 ENCOUNTER — Encounter: Payer: Self-pay | Admitting: Internal Medicine

## 2013-04-19 VITALS — BP 115/76 | HR 63 | Temp 96.4°F | Resp 18 | Ht 66.0 in | Wt 225.0 lb

## 2013-04-19 DIAGNOSIS — Z1211 Encounter for screening for malignant neoplasm of colon: Secondary | ICD-10-CM

## 2013-04-19 MED ORDER — SODIUM CHLORIDE 0.9 % IV SOLN: 500.0000 mL | INTRAVENOUS | Status: DC

## 2013-04-19 NOTE — Progress Notes (Signed)
Lidocaine-40mg IV prior to Propofol InductionPropofol given over incremental dosages 

## 2013-04-19 NOTE — Op Note (Signed)
[facility] [address] [place], [address]   COLONOSCOPY PROCEDURE REPORT  PATIENT: Kristy Sherman, Kristy Sherman  MR#: [id number] BIRTHDATE: May 15, 1951 , 62  yrs. old GENDER: Female ENDOSCOPIST: Hart Carwin, MD REFERRED QM:VHQIO Osborne, M.D. PROCEDURE DATE:  04/19/2013 PROCEDURE:   Colonoscopy, screening First Screening Colonoscopy - Avg.  risk and is 50 yrs.  old or older - No.  Prior Negative Screening - Now for repeat screening. Above average risk  History of Adenoma - Now for follow-up colonoscopy & has been > or = to 3 yrs.  N/A  Polyps Removed Today? No.  Recommend repeat exam, <10 yrs? No. ASA CLASS:   Class II INDICATIONS:last colon 2006- Grandparent with colon cancer. MEDICATIONS: MAC sedation, administered by CRNA and propofol (Diprivan) 250mg  IV  DESCRIPTION OF PROCEDURE:   After the risks benefits and alternatives of the procedure were thoroughly explained, informed consent was obtained.  A digital rectal exam revealed no abnormalities of the rectum.   The LB PFC-H190 [id number]  endoscope was introduced through the anus and advanced to the cecum, which was identified by both the appendix and ileocecal valve. No adverse events experienced.   The quality of the prep was excellent, using MoviPrep  The instrument was then slowly withdrawn as the colon was fully examined.      COLON FINDINGS: Mild diverticulosis was noted in the descending colon.  Retroflexed views revealed no abnormalities. The time to cecum=4 minutes 55 seconds.  Withdrawal time=7 minutes 30 seconds. The scope was withdrawn and the procedure completed. COMPLICATIONS: There were no complications.  ENDOSCOPIC IMPRESSION: Mild diverticulosis was noted in the descending colon  RECOMMENDATIONS: high fiber diet   eSigned:  Hart Carwin, MD 04/19/2013 8:27 AM   cc:

## 2013-04-19 NOTE — Patient Instructions (Signed)
YOU HAD AN ENDOSCOPIC PROCEDURE TODAY AT THE [facility]: Refer to the procedure report that was given to you for any specific questions about what was found during the examination.  If the procedure report does not answer your questions, please call your gastroenterologist to clarify.  If you requested that your care partner not be given the details of your procedure findings, then the procedure report has been included in a sealed envelope for you to review at your convenience later.  YOU SHOULD EXPECT: Some feelings of bloating in the abdomen. Passage of more gas than usual.  Walking can help get rid of the air that was put into your GI tract during the procedure and reduce the bloating. If you had a lower endoscopy (such as a colonoscopy or flexible sigmoidoscopy) you may notice spotting of blood in your stool or on the toilet paper. If you underwent a bowel prep for your procedure, then you may not have a normal bowel movement for a few days.  DIET: Your first meal following the procedure should be a light meal and then it is ok to progress to your normal diet.  A half-sandwich or bowl of soup is an example of a good first meal.  Heavy or fried foods are harder to digest and may make you feel nauseous or bloated.  Likewise meals heavy in dairy and vegetables can cause extra gas to form and this can also increase the bloating.  Drink plenty of fluids but you should avoid alcoholic beverages for 24 hours.  ACTIVITY: Your care partner should take you home directly after the procedure.  You should plan to take it easy, moving slowly for the rest of the day.  You can resume normal activity the day after the procedure however you should NOT DRIVE or use heavy machinery for 24 hours (because of the sedation medicines used during the test).    SYMPTOMS TO REPORT IMMEDIATELY: A gastroenterologist can be reached at any hour.  During normal business hours, 8:30 AM to 5:00 PM Monday through Friday,  call [phone].  After hours and on weekends, please call the GI answering service at [phone] who will take a message and have the physician on call contact you.   Following lower endoscopy (colonoscopy or flexible sigmoidoscopy):  Excessive amounts of blood in the stool  Significant tenderness or worsening of abdominal pains  Swelling of the abdomen that is new, acute  Fever of 100F or higher    FOLLOW UP: If any biopsies were taken you will be contacted by phone or by letter within the next 1-3 weeks.  Call your gastroenterologist if you have not heard about the biopsies in 3 weeks.  Our staff will call the home number listed on your records the next business day following your procedure to check on you and address any questions or concerns that you may have at that time regarding the information given to you following your procedure. This is a courtesy call and so if there is no answer at the home number and we have not heard from you through the emergency physician on call, we will assume that you have returned to your regular daily activities without incident.  SIGNATURES/CONFIDENTIALITY: You and/or your care partner have signed paperwork which will be entered into your electronic medical record.  These signatures attest to the fact that that the information above on your After Visit Summary has been reviewed and is understood.  Full responsibility of the confidentiality   of this discharge information lies with you and/or your care-partner.     Information on diverticulosis & high fiber diet given to you today 

## 2013-04-19 NOTE — Progress Notes (Signed)
Patient did not have preoperative order for IV antibiotic SSI prophylaxis. (G8918)Patient did not experience any of the following events: a burn prior to discharge; a fall within the facility; wrong site/side/patient/procedure/implant event; or a hospital transfer or hospital admission upon discharge from the facility. (G8907)Patient did not experience any of the following events: a burn prior to discharge; a fall within the facility; wrong site/side/patient/procedure/implant event; or a hospital transfer or hospital admission upon discharge from the facility. (G8907) 

## 2013-04-20 ENCOUNTER — Telehealth: Payer: Self-pay | Admitting: *Deleted

## 2013-04-20 NOTE — Telephone Encounter (Signed)
No answer. Name identifier. Message left to call if any questions or concerns. 

## 2013-05-18 ENCOUNTER — Other Ambulatory Visit: Payer: Self-pay

## 2013-07-11 ENCOUNTER — Other Ambulatory Visit: Payer: Self-pay | Admitting: Obstetrics and Gynecology

## 2013-07-11 DIAGNOSIS — Z1231 Encounter for screening mammogram for malignant neoplasm of breast: Secondary | ICD-10-CM

## 2013-07-27 ENCOUNTER — Ambulatory Visit (HOSPITAL_COMMUNITY)
Admission: RE | Admit: 2013-07-27 | Discharge: 2013-07-27 | Disposition: A | Discharge status: Home / Self Care | Payer: 59 | Source: Ambulatory Visit | Attending: Obstetrics and Gynecology | Admitting: Obstetrics and Gynecology

## 2013-07-27 DIAGNOSIS — Z1231 Encounter for screening mammogram for malignant neoplasm of breast: Secondary | ICD-10-CM | POA: Insufficient documentation

## 2014-04-27 ENCOUNTER — Other Ambulatory Visit: Payer: Self-pay

## 2014-05-14 ENCOUNTER — Encounter: Payer: Self-pay | Admitting: Internal Medicine

## 2015-04-05 ENCOUNTER — Other Ambulatory Visit (HOSPITAL_COMMUNITY): Payer: Self-pay | Admitting: Obstetrics and Gynecology

## 2015-04-05 DIAGNOSIS — Z1231 Encounter for screening mammogram for malignant neoplasm of breast: Secondary | ICD-10-CM

## 2015-04-09 ENCOUNTER — Other Ambulatory Visit: Payer: Self-pay | Admitting: Obstetrics and Gynecology

## 2015-04-09 DIAGNOSIS — Z1231 Encounter for screening mammogram for malignant neoplasm of breast: Secondary | ICD-10-CM

## 2015-04-11 ENCOUNTER — Ambulatory Visit (HOSPITAL_COMMUNITY): Payer: 59

## 2015-08-05 DIAGNOSIS — R109 Unspecified abdominal pain: Secondary | ICD-10-CM | POA: Diagnosis not present

## 2015-08-05 DIAGNOSIS — S39012A Strain of muscle, fascia and tendon of lower back, initial encounter: Secondary | ICD-10-CM | POA: Diagnosis not present

## 2015-08-29 DIAGNOSIS — Z0000 Encounter for general adult medical examination without abnormal findings: Secondary | ICD-10-CM | POA: Diagnosis not present

## 2015-08-29 DIAGNOSIS — M3500 Sicca syndrome, unspecified: Secondary | ICD-10-CM | POA: Diagnosis not present

## 2015-08-29 DIAGNOSIS — E785 Hyperlipidemia, unspecified: Secondary | ICD-10-CM | POA: Diagnosis not present

## 2015-08-29 DIAGNOSIS — Z6834 Body mass index (BMI) 34.0-34.9, adult: Secondary | ICD-10-CM | POA: Diagnosis not present

## 2015-08-29 DIAGNOSIS — I10 Essential (primary) hypertension: Secondary | ICD-10-CM | POA: Diagnosis not present

## 2015-09-26 DIAGNOSIS — N952 Postmenopausal atrophic vaginitis: Secondary | ICD-10-CM | POA: Diagnosis not present

## 2015-09-26 DIAGNOSIS — Z1213 Encounter for screening for malignant neoplasm of small intestine: Secondary | ICD-10-CM | POA: Diagnosis not present

## 2015-09-26 DIAGNOSIS — Z6835 Body mass index (BMI) 35.0-35.9, adult: Secondary | ICD-10-CM | POA: Diagnosis not present

## 2015-09-26 DIAGNOSIS — Z124 Encounter for screening for malignant neoplasm of cervix: Secondary | ICD-10-CM | POA: Diagnosis not present

## 2015-09-26 DIAGNOSIS — Z01411 Encounter for gynecological examination (general) (routine) with abnormal findings: Secondary | ICD-10-CM | POA: Diagnosis not present

## 2015-10-07 MED FILL — AMLODIPINE BESYLATE 10 MG T: 10 | 90 days supply | Qty: 90 | Fill #1

## 2015-10-07 MED FILL — HYDROCHLOROTHIAZIDE 25 MG T: 25 | 90 days supply | Qty: 90 | Fill #2

## 2016-01-08 MED FILL — HYDROCHLOROTHIAZIDE 25 MG T: 25 | 90 days supply | Qty: 90 | Fill #3

## 2016-01-09 MED FILL — AMLODIPINE BESYLATE 10 MG T: 10 | 90 days supply | Qty: 90 | Fill #0

## 2016-04-09 MED FILL — AMLODIPINE BESYLATE 10 MG T: 10 | 30 days supply | Qty: 30 | Fill #0

## 2016-04-15 DIAGNOSIS — I8390 Asymptomatic varicose veins of unspecified lower extremity: Secondary | ICD-10-CM | POA: Diagnosis not present

## 2016-04-15 DIAGNOSIS — M3500 Sicca syndrome, unspecified: Secondary | ICD-10-CM | POA: Diagnosis not present

## 2016-04-15 DIAGNOSIS — E559 Vitamin D deficiency, unspecified: Secondary | ICD-10-CM | POA: Diagnosis not present

## 2016-04-15 DIAGNOSIS — E785 Hyperlipidemia, unspecified: Secondary | ICD-10-CM | POA: Diagnosis not present

## 2016-04-15 DIAGNOSIS — I10 Essential (primary) hypertension: Secondary | ICD-10-CM | POA: Diagnosis not present

## 2016-04-15 DIAGNOSIS — Z6834 Body mass index (BMI) 34.0-34.9, adult: Secondary | ICD-10-CM | POA: Diagnosis not present

## 2016-04-15 MED FILL — HYDROCHLOROTHIAZIDE 25 MG T: 25 | 90 days supply | Qty: 90 | Fill #0

## 2016-05-08 MED FILL — AMLODIPINE BESYLATE 10 MG T: 10 | 90 days supply | Qty: 90 | Fill #0

## 2016-07-14 MED FILL — HYDROCHLOROTHIAZIDE 25 MG T: 25 | 90 days supply | Qty: 90 | Fill #1

## 2016-07-21 DIAGNOSIS — I10 Essential (primary) hypertension: Secondary | ICD-10-CM | POA: Diagnosis not present

## 2016-07-21 DIAGNOSIS — J0110 Acute frontal sinusitis, unspecified: Secondary | ICD-10-CM | POA: Diagnosis not present

## 2016-07-21 DIAGNOSIS — E785 Hyperlipidemia, unspecified: Secondary | ICD-10-CM | POA: Diagnosis not present

## 2016-07-21 DIAGNOSIS — J029 Acute pharyngitis, unspecified: Secondary | ICD-10-CM | POA: Diagnosis not present

## 2016-07-21 DIAGNOSIS — L989 Disorder of the skin and subcutaneous tissue, unspecified: Secondary | ICD-10-CM | POA: Diagnosis not present

## 2016-07-22 MED FILL — AMOXICILLIN 500 MG CAPSULE: 500 | 10 days supply | Qty: 20 | Fill #0

## 2016-08-06 MED FILL — AMLODIPINE BESYLATE 10 MG T: 10 | 90 days supply | Qty: 90 | Fill #1

## 2016-08-11 DIAGNOSIS — L720 Epidermal cyst: Secondary | ICD-10-CM | POA: Diagnosis not present

## 2016-08-11 DIAGNOSIS — D485 Neoplasm of uncertain behavior of skin: Secondary | ICD-10-CM | POA: Diagnosis not present

## 2016-10-08 MED FILL — HYDROCHLOROTHIAZIDE 25 MG T: 25 | 30 days supply | Qty: 30 | Fill #2

## 2016-10-19 DIAGNOSIS — Z1231 Encounter for screening mammogram for malignant neoplasm of breast: Secondary | ICD-10-CM | POA: Diagnosis not present

## 2016-10-19 DIAGNOSIS — Z6837 Body mass index (BMI) 37.0-37.9, adult: Secondary | ICD-10-CM | POA: Diagnosis not present

## 2016-10-19 DIAGNOSIS — Z01411 Encounter for gynecological examination (general) (routine) with abnormal findings: Secondary | ICD-10-CM | POA: Diagnosis not present

## 2016-10-19 DIAGNOSIS — N952 Postmenopausal atrophic vaginitis: Secondary | ICD-10-CM | POA: Diagnosis not present

## 2016-10-19 MED FILL — YUVAFEM 10 MCG VAGINAL INSE: 10 | 84 days supply | Qty: 24 | Fill #0

## 2016-10-23 DIAGNOSIS — I10 Essential (primary) hypertension: Secondary | ICD-10-CM | POA: Diagnosis not present

## 2016-10-23 DIAGNOSIS — Z8249 Family history of ischemic heart disease and other diseases of the circulatory system: Secondary | ICD-10-CM | POA: Diagnosis not present

## 2016-10-23 DIAGNOSIS — Z6834 Body mass index (BMI) 34.0-34.9, adult: Secondary | ICD-10-CM | POA: Diagnosis not present

## 2016-10-23 DIAGNOSIS — Z1211 Encounter for screening for malignant neoplasm of colon: Secondary | ICD-10-CM | POA: Diagnosis not present

## 2016-10-23 DIAGNOSIS — E559 Vitamin D deficiency, unspecified: Secondary | ICD-10-CM | POA: Diagnosis not present

## 2016-10-23 DIAGNOSIS — Z0000 Encounter for general adult medical examination without abnormal findings: Secondary | ICD-10-CM | POA: Diagnosis not present

## 2016-10-23 DIAGNOSIS — M3500 Sicca syndrome, unspecified: Secondary | ICD-10-CM | POA: Diagnosis not present

## 2016-10-23 DIAGNOSIS — E785 Hyperlipidemia, unspecified: Secondary | ICD-10-CM | POA: Diagnosis not present

## 2016-10-23 DIAGNOSIS — Z1231 Encounter for screening mammogram for malignant neoplasm of breast: Secondary | ICD-10-CM | POA: Diagnosis not present

## 2016-10-23 MED FILL — ATORVASTATIN 10 MG TABLET: 10 | 30 days supply | Qty: 30 | Fill #0

## 2016-10-23 MED FILL — AMLODIPINE BESYLATE 10 MG T: 10 | 90 days supply | Qty: 90 | Fill #0

## 2016-11-11 MED FILL — HYDROCHLOROTHIAZIDE 25 MG T: 25 | 90 days supply | Qty: 90 | Fill #0

## 2016-11-23 MED FILL — ATORVASTATIN 10 MG TABLET: 10 | 30 days supply | Qty: 30 | Fill #1

## 2016-12-16 DIAGNOSIS — H524 Presbyopia: Secondary | ICD-10-CM | POA: Diagnosis not present

## 2016-12-25 MED FILL — ATORVASTATIN 10 MG TABLET: 10 | 30 days supply | Qty: 30 | Fill #2

## 2017-01-21 MED FILL — ATORVASTATIN 10 MG TABLET: 10 | 30 days supply | Qty: 30 | Fill #0

## 2017-02-02 MED FILL — AMLODIPINE BESYLATE 10 MG T: 10 | 90 days supply | Qty: 90 | Fill #1

## 2017-02-02 MED FILL — HYDROCHLOROTHIAZIDE 25 MG T: 25 | 90 days supply | Qty: 90 | Fill #1

## 2017-02-17 DIAGNOSIS — L728 Other follicular cysts of the skin and subcutaneous tissue: Secondary | ICD-10-CM | POA: Diagnosis not present

## 2017-02-17 DIAGNOSIS — L7211 Pilar cyst: Secondary | ICD-10-CM | POA: Diagnosis not present

## 2017-02-22 MED FILL — ATORVASTATIN 10 MG TABLET: 10 | 30 days supply | Qty: 30 | Fill #1

## 2017-03-01 MED FILL — YUVAFEM 10 MCG VAGINAL INSE: 10 | 84 days supply | Qty: 24 | Fill #1

## 2017-03-24 MED FILL — ATORVASTATIN 10 MG TABLET: 10 | 30 days supply | Qty: 30 | Fill #2

## 2017-04-26 MED FILL — ATORVASTATIN 10 MG TABLET: 10 | 30 days supply | Qty: 30 | Fill #3

## 2017-05-03 DIAGNOSIS — I10 Essential (primary) hypertension: Secondary | ICD-10-CM | POA: Diagnosis not present

## 2017-05-03 DIAGNOSIS — M3500 Sicca syndrome, unspecified: Secondary | ICD-10-CM | POA: Diagnosis not present

## 2017-05-03 DIAGNOSIS — E785 Hyperlipidemia, unspecified: Secondary | ICD-10-CM | POA: Diagnosis not present

## 2017-05-03 DIAGNOSIS — E559 Vitamin D deficiency, unspecified: Secondary | ICD-10-CM | POA: Diagnosis not present

## 2017-05-07 MED FILL — AMLODIPINE BESYLATE 10 MG T: 10 | 90 days supply | Qty: 90 | Fill #2

## 2017-05-10 MED FILL — HYDROCHLOROTHIAZIDE 25 MG T: 25 | 90 days supply | Qty: 90 | Fill #2

## 2017-05-25 MED FILL — ATORVASTATIN 10 MG TABLET: 10 | 90 days supply | Qty: 90 | Fill #4

## 2017-08-06 MED FILL — HYDROCHLOROTHIAZIDE 25 MG T: 25 | 90 days supply | Qty: 90 | Fill #3

## 2017-08-06 MED FILL — AMLODIPINE BESYLATE 10 MG T: 10 | 90 days supply | Qty: 90 | Fill #3

## 2017-08-06 MED FILL — ATORVASTATIN 10 MG TABLET: 10 | 90 days supply | Qty: 90 | Fill #0

## 2017-11-02 MED FILL — HYDROCHLOROTHIAZIDE 25 MG T: 25 | 90 days supply | Qty: 90 | Fill #0

## 2017-11-02 MED FILL — ATORVASTATIN 10 MG TABLET: 10 | 90 days supply | Qty: 90 | Fill #1

## 2017-11-02 MED FILL — AMLODIPINE BESYLATE 10 MG T: 10 | 90 days supply | Qty: 90 | Fill #0

## 2018-01-20 DIAGNOSIS — M8588 Other specified disorders of bone density and structure, other site: Secondary | ICD-10-CM | POA: Diagnosis not present

## 2018-01-20 DIAGNOSIS — Z780 Asymptomatic menopausal state: Secondary | ICD-10-CM | POA: Diagnosis not present

## 2018-02-03 MED FILL — ATORVASTATIN 10 MG TABLET: 10 | 90 days supply | Qty: 90 | Fill #2

## 2018-02-03 MED FILL — AMLODIPINE BESYLATE 10 MG T: 10 | 90 days supply | Qty: 90 | Fill #1

## 2018-02-03 MED FILL — HYDROCHLOROTHIAZIDE 25 MG T: 25 | 90 days supply | Qty: 90 | Fill #1

## 2018-04-28 MED FILL — AMLODIPINE BESYLATE 10 MG T: 10 | 90 days supply | Qty: 90 | Fill #2

## 2018-04-28 MED FILL — ATORVASTATIN 10 MG TABLET: 10 | 90 days supply | Qty: 90 | Fill #0

## 2018-04-28 MED FILL — HYDROCHLOROTHIAZIDE 25 MG T: 25 | 90 days supply | Qty: 90 | Fill #2

## 2018-05-05 DIAGNOSIS — Z23 Encounter for immunization: Secondary | ICD-10-CM | POA: Diagnosis not present

## 2018-05-05 DIAGNOSIS — Z6834 Body mass index (BMI) 34.0-34.9, adult: Secondary | ICD-10-CM | POA: Diagnosis not present

## 2018-05-05 DIAGNOSIS — E785 Hyperlipidemia, unspecified: Secondary | ICD-10-CM | POA: Diagnosis not present

## 2018-05-05 DIAGNOSIS — I10 Essential (primary) hypertension: Secondary | ICD-10-CM | POA: Diagnosis not present

## 2018-07-27 MED FILL — HYDROCHLOROTHIAZIDE 25 MG T: 25 | 90 days supply | Qty: 90 | Fill #0

## 2018-07-27 MED FILL — ATORVASTATIN 10 MG TABLET: 10 | 90 days supply | Qty: 90 | Fill #1

## 2018-07-27 MED FILL — AMLODIPINE BESYLATE 10 MG T: 10 | 90 days supply | Qty: 90 | Fill #0

## 2018-09-12 DIAGNOSIS — J069 Acute upper respiratory infection, unspecified: Secondary | ICD-10-CM | POA: Diagnosis not present

## 2018-09-12 MED FILL — IPRATROPIUM 0.06% SPRAY: 0.06 | 11 days supply | Qty: 15 | Fill #0

## 2018-09-12 MED FILL — BENZONATATE 200 MG CAPS: 200 | 5 days supply | Qty: 15 | Fill #0

## 2018-10-24 MED FILL — HYDROCHLOROTHIAZIDE 25 MG T: 25 | 90 days supply | Qty: 90 | Fill #0

## 2018-10-24 MED FILL — ATORVASTATIN 10 MG TABLET: 10 | 90 days supply | Qty: 90 | Fill #0

## 2018-10-24 MED FILL — AMLODIPINE BESYLATE 10 MG T: 10 | 90 days supply | Qty: 90 | Fill #0

## 2018-11-10 DIAGNOSIS — R1033 Periumbilical pain: Secondary | ICD-10-CM | POA: Diagnosis not present

## 2018-11-11 ENCOUNTER — Encounter (HOSPITAL_BASED_OUTPATIENT_CLINIC_OR_DEPARTMENT_OTHER): Payer: Self-pay | Admitting: *Deleted

## 2018-11-11 ENCOUNTER — Emergency Department (HOSPITAL_COMMUNITY): Payer: PPO | Admitting: Certified Registered Nurse Anesthetist

## 2018-11-11 ENCOUNTER — Encounter (HOSPITAL_COMMUNITY)
Admission: EM | Disposition: A | Discharge status: Home / Self Care | Payer: Self-pay | Source: Home / Self Care | Attending: Surgery

## 2018-11-11 ENCOUNTER — Inpatient Hospital Stay (HOSPITAL_BASED_OUTPATIENT_CLINIC_OR_DEPARTMENT_OTHER)
Admission: EM | Admit: 2018-11-11 | Discharge: 2018-11-12 | DRG: 340 | Disposition: A | Discharge status: Home / Self Care | Payer: PPO | Attending: Surgery | Admitting: Surgery

## 2018-11-11 ENCOUNTER — Emergency Department (HOSPITAL_BASED_OUTPATIENT_CLINIC_OR_DEPARTMENT_OTHER): Payer: PPO

## 2018-11-11 ENCOUNTER — Other Ambulatory Visit: Payer: Self-pay

## 2018-11-11 DIAGNOSIS — K760 Fatty (change of) liver, not elsewhere classified: Secondary | ICD-10-CM | POA: Diagnosis not present

## 2018-11-11 DIAGNOSIS — Z79899 Other long term (current) drug therapy: Secondary | ICD-10-CM

## 2018-11-11 DIAGNOSIS — N281 Cyst of kidney, acquired: Secondary | ICD-10-CM | POA: Diagnosis not present

## 2018-11-11 DIAGNOSIS — E785 Hyperlipidemia, unspecified: Secondary | ICD-10-CM | POA: Diagnosis present

## 2018-11-11 DIAGNOSIS — D490 Neoplasm of unspecified behavior of digestive system: Secondary | ICD-10-CM | POA: Diagnosis not present

## 2018-11-11 DIAGNOSIS — K5730 Diverticulosis of large intestine without perforation or abscess without bleeding: Secondary | ICD-10-CM | POA: Diagnosis present

## 2018-11-11 DIAGNOSIS — M3500 Sicca syndrome, unspecified: Secondary | ICD-10-CM | POA: Diagnosis present

## 2018-11-11 DIAGNOSIS — Z6833 Body mass index (BMI) 33.0-33.9, adult: Secondary | ICD-10-CM

## 2018-11-11 DIAGNOSIS — Z800 Family history of malignant neoplasm of digestive organs: Secondary | ICD-10-CM | POA: Diagnosis not present

## 2018-11-11 DIAGNOSIS — K3580 Unspecified acute appendicitis: Secondary | ICD-10-CM

## 2018-11-11 DIAGNOSIS — K3532 Acute appendicitis with perforation and localized peritonitis, without abscess: Principal | ICD-10-CM | POA: Diagnosis present

## 2018-11-11 DIAGNOSIS — K449 Diaphragmatic hernia without obstruction or gangrene: Secondary | ICD-10-CM | POA: Diagnosis present

## 2018-11-11 DIAGNOSIS — I10 Essential (primary) hypertension: Secondary | ICD-10-CM | POA: Diagnosis not present

## 2018-11-11 DIAGNOSIS — C181 Malignant neoplasm of appendix: Secondary | ICD-10-CM | POA: Diagnosis not present

## 2018-11-11 DIAGNOSIS — N941 Unspecified dyspareunia: Secondary | ICD-10-CM | POA: Insufficient documentation

## 2018-11-11 DIAGNOSIS — Z7982 Long term (current) use of aspirin: Secondary | ICD-10-CM | POA: Diagnosis not present

## 2018-11-11 DIAGNOSIS — R1031 Right lower quadrant pain: Secondary | ICD-10-CM | POA: Diagnosis not present

## 2018-11-11 DIAGNOSIS — E669 Obesity, unspecified: Secondary | ICD-10-CM | POA: Diagnosis present

## 2018-11-11 DIAGNOSIS — N952 Postmenopausal atrophic vaginitis: Secondary | ICD-10-CM | POA: Insufficient documentation

## 2018-11-11 HISTORY — PX: LAPAROSCOPIC APPENDECTOMY: SHX408

## 2018-11-11 HISTORY — DX: Pain in unspecified ankle and joints of unspecified foot: M25.579

## 2018-11-11 LAB — URINALYSIS, ROUTINE W REFLEX MICROSCOPIC
Bilirubin Urine: NEGATIVE
Glucose, UA: NEGATIVE mg/dL
Hgb urine dipstick: NEGATIVE
Ketones, ur: NEGATIVE mg/dL
Nitrite: NEGATIVE
Protein, ur: NEGATIVE mg/dL
Specific Gravity, Urine: 1.015 (ref 1.005–1.030)
pH: 6 (ref 5.0–8.0)

## 2018-11-11 LAB — COMPREHENSIVE METABOLIC PANEL WITH GFR
ALT: 21 U/L (ref 0–44)
AST: 18 U/L (ref 15–41)
Albumin: 3.9 g/dL (ref 3.5–5.0)
Alkaline Phosphatase: 64 U/L (ref 38–126)
Anion gap: 8 (ref 5–15)
BUN: 19 mg/dL (ref 8–23)
CO2: 28 mmol/L (ref 22–32)
Calcium: 9.5 mg/dL (ref 8.9–10.3)
Chloride: 103 mmol/L (ref 98–111)
Creatinine, Ser: 0.99 mg/dL (ref 0.44–1.00)
GFR calc Af Amer: >60 mL/min
GFR calc non Af Amer: 59 mL/min — ABNORMAL LOW
Glucose, Bld: 117 mg/dL — ABNORMAL HIGH (ref 70–99)
Potassium: 3.9 mmol/L (ref 3.5–5.1)
Sodium: 139 mmol/L (ref 135–145)
Total Bilirubin: 0.4 mg/dL (ref 0.3–1.2)
Total Protein: 7.6 g/dL (ref 6.5–8.1)

## 2018-11-11 LAB — CBC WITH DIFFERENTIAL/PLATELET
Abs Immature Granulocytes: 0.02 10*3/uL (ref 0.00–0.07)
Basophils Absolute: 0 10*3/uL (ref 0.0–0.1)
Basophils Relative: 0 %
Eosinophils Absolute: 0 10*3/uL (ref 0.0–0.5)
Eosinophils Relative: 0 %
HCT: 42.5 % (ref 36.0–46.0)
Hemoglobin: 13.5 g/dL (ref 12.0–15.0)
Immature Granulocytes: 0 %
Lymphocytes Relative: 12 %
Lymphs Abs: 0.8 10*3/uL (ref 0.7–4.0)
MCH: 29.9 pg (ref 26.0–34.0)
MCHC: 31.8 g/dL (ref 30.0–36.0)
MCV: 94.2 fL (ref 80.0–100.0)
Monocytes Absolute: 0.6 10*3/uL (ref 0.1–1.0)
Monocytes Relative: 9 %
Neutro Abs: 5.1 10*3/uL (ref 1.7–7.7)
Neutrophils Relative %: 79 %
Platelets: 258 10*3/uL (ref 150–400)
RBC: 4.51 MIL/uL (ref 3.87–5.11)
RDW: 13.3 % (ref 11.5–15.5)
WBC: 6.5 10*3/uL (ref 4.0–10.5)
nRBC: 0 % (ref 0.0–0.2)

## 2018-11-11 LAB — URINALYSIS, MICROSCOPIC (REFLEX)

## 2018-11-11 LAB — LIPASE, BLOOD: Lipase: 28 U/L (ref 11–51)

## 2018-11-11 SURGERY — APPENDECTOMY, LAPAROSCOPIC
Anesthesia: General | Site: Abdomen

## 2018-11-11 MED ORDER — PIPERACILLIN-TAZOBACTAM 3.375 G IVPB
3.3750 g | Freq: Three times a day (TID) | INTRAVENOUS | Status: DC
  Administered 2018-11-11: 3.375 g via INTRAVENOUS

## 2018-11-11 MED ORDER — PHENYLEPHRINE 40 MCG/ML (10ML) SYRINGE FOR IV PUSH (FOR BLOOD PRESSURE SUPPORT)
PREFILLED_SYRINGE | INTRAVENOUS | Status: DC | PRN
  Administered 2018-11-11: 80 ug via INTRAVENOUS

## 2018-11-11 MED ORDER — ENOXAPARIN SODIUM 40 MG/0.4ML ~~LOC~~ SOLN
40.0000 mg | SUBCUTANEOUS | Status: DC
  Administered 2018-11-12: 40 mg via SUBCUTANEOUS
  Filled 2018-11-11: qty 0.4

## 2018-11-11 MED ORDER — 0.9 % SODIUM CHLORIDE (POUR BTL) OPTIME
TOPICAL | Status: DC | PRN
  Administered 2018-11-11: 1000 mL

## 2018-11-11 MED ORDER — SUCCINYLCHOLINE CHLORIDE 200 MG/10ML IV SOSY
PREFILLED_SYRINGE | INTRAVENOUS | Status: DC | PRN
  Administered 2018-11-11: 140 mg via INTRAVENOUS

## 2018-11-11 MED ORDER — ONDANSETRON 4 MG PO TBDP: 4.0000 mg | ORAL_TABLET | Freq: Four times a day (QID) | ORAL | Status: DC | PRN

## 2018-11-11 MED ORDER — ZOLPIDEM TARTRATE 5 MG PO TABS: 5.0000 mg | ORAL_TABLET | Freq: Every evening | ORAL | Status: DC | PRN

## 2018-11-11 MED ORDER — SUGAMMADEX SODIUM 500 MG/5ML IV SOLN
INTRAVENOUS | Status: DC | PRN
  Administered 2018-11-11: 300 mg via INTRAVENOUS

## 2018-11-11 MED ORDER — ENSURE SURGERY PO LIQD
237.0000 mL | Freq: Two times a day (BID) | ORAL | Status: DC
  Administered 2018-11-12 (×2): 237 mL via ORAL
  Filled 2018-11-11 (×2): qty 237

## 2018-11-11 MED ORDER — CHLORHEXIDINE GLUCONATE CLOTH 2 % EX PADS: 6.0000 | MEDICATED_PAD | Freq: Once | CUTANEOUS | Status: DC

## 2018-11-11 MED ORDER — DEXAMETHASONE SODIUM PHOSPHATE 10 MG/ML IJ SOLN
INTRAMUSCULAR | Status: DC | PRN
  Administered 2018-11-11: 10 mg via INTRAVENOUS

## 2018-11-11 MED ORDER — PROCHLORPERAZINE EDISYLATE 10 MG/2ML IJ SOLN: 5.0000 mg | Freq: Four times a day (QID) | INTRAMUSCULAR | Status: DC | PRN

## 2018-11-11 MED ORDER — METHOCARBAMOL 500 MG PO TABS
750.0000 mg | ORAL_TABLET | Freq: Four times a day (QID) | ORAL | Status: DC | PRN
  Administered 2018-11-12: 750 mg via ORAL
  Filled 2018-11-11: qty 2

## 2018-11-11 MED ORDER — GABAPENTIN 300 MG PO CAPS
300.0000 mg | ORAL_CAPSULE | ORAL | Status: AC
  Administered 2018-11-11: 300 mg via ORAL
  Filled 2018-11-11: qty 1

## 2018-11-11 MED ORDER — SODIUM CHLORIDE 0.9 % IV SOLN
INTRAVENOUS | Status: DC
  Administered 2018-11-11: 19:00:00 via INTRAVENOUS

## 2018-11-11 MED ORDER — ONDANSETRON HCL 4 MG/2ML IJ SOLN
INTRAMUSCULAR | Status: DC | PRN
  Administered 2018-11-11: 4 mg via INTRAVENOUS

## 2018-11-11 MED ORDER — SODIUM CHLORIDE 0.9 % IV SOLN
INTRAVENOUS | Status: DC
  Administered 2018-11-11: 09:00:00 via INTRAVENOUS

## 2018-11-11 MED ORDER — ROCURONIUM BROMIDE 10 MG/ML (PF) SYRINGE
PREFILLED_SYRINGE | INTRAVENOUS | Status: AC
  Filled 2018-11-11: qty 10

## 2018-11-11 MED ORDER — LIDOCAINE 2% (20 MG/ML) 5 ML SYRINGE
INTRAMUSCULAR | Status: DC | PRN
  Administered 2018-11-11: 80 mg via INTRAVENOUS

## 2018-11-11 MED ORDER — LACTATED RINGERS IV SOLN: 1000.0000 mL | Freq: Three times a day (TID) | INTRAVENOUS | Status: DC | PRN

## 2018-11-11 MED ORDER — LIP MEDEX EX OINT
1.0000 "application " | TOPICAL_OINTMENT | Freq: Two times a day (BID) | CUTANEOUS | Status: DC
  Administered 2018-11-12: 1 via TOPICAL
  Filled 2018-11-11 (×2): qty 7

## 2018-11-11 MED ORDER — ONDANSETRON HCL 4 MG/2ML IJ SOLN
INTRAMUSCULAR | Status: AC
  Filled 2018-11-11: qty 2

## 2018-11-11 MED ORDER — ACETAMINOPHEN 500 MG PO TABS
1000.0000 mg | ORAL_TABLET | ORAL | Status: AC
  Administered 2018-11-11: 1000 mg via ORAL
  Filled 2018-11-11: qty 2

## 2018-11-11 MED ORDER — FENTANYL CITRATE (PF) 100 MCG/2ML IJ SOLN
INTRAMUSCULAR | Status: DC | PRN
  Administered 2018-11-11 (×2): 50 ug via INTRAVENOUS

## 2018-11-11 MED ORDER — DIPHENHYDRAMINE HCL 12.5 MG/5ML PO ELIX: 12.5000 mg | ORAL_SOLUTION | Freq: Four times a day (QID) | ORAL | Status: DC | PRN

## 2018-11-11 MED ORDER — ONDANSETRON HCL 4 MG/2ML IJ SOLN: 4.0000 mg | Freq: Once | INTRAMUSCULAR | Status: DC | PRN

## 2018-11-11 MED ORDER — FENTANYL CITRATE (PF) 100 MCG/2ML IJ SOLN
INTRAMUSCULAR | Status: AC
  Filled 2018-11-11: qty 2

## 2018-11-11 MED ORDER — BUPIVACAINE LIPOSOME 1.3 % IJ SUSP
20.0000 mL | Freq: Once | INTRAMUSCULAR | Status: DC
  Filled 2018-11-11: qty 20

## 2018-11-11 MED ORDER — ONDANSETRON HCL 4 MG/2ML IJ SOLN
4.0000 mg | Freq: Four times a day (QID) | INTRAMUSCULAR | Status: DC | PRN
  Administered 2018-11-11: 4 mg via INTRAVENOUS
  Filled 2018-11-11: qty 2

## 2018-11-11 MED ORDER — SODIUM CHLORIDE 0.9 % IV SOLN
1.0000 g | Freq: Once | INTRAVENOUS | Status: AC
  Administered 2018-11-11: 1 g via INTRAVENOUS
  Filled 2018-11-11: qty 10

## 2018-11-11 MED ORDER — OXYCODONE HCL 5 MG/5ML PO SOLN: 5.0000 mg | Freq: Once | ORAL | Status: DC | PRN

## 2018-11-11 MED ORDER — BUPIVACAINE-EPINEPHRINE (PF) 0.25% -1:200000 IJ SOLN
INTRAMUSCULAR | Status: AC
  Filled 2018-11-11: qty 60

## 2018-11-11 MED ORDER — STERILE WATER FOR IRRIGATION IR SOLN
Status: DC | PRN
  Administered 2018-11-11: 1000 mL

## 2018-11-11 MED ORDER — PROCHLORPERAZINE MALEATE 10 MG PO TABS: 10.0000 mg | ORAL_TABLET | Freq: Four times a day (QID) | ORAL | Status: DC | PRN

## 2018-11-11 MED ORDER — METRONIDAZOLE IN NACL 5-0.79 MG/ML-% IV SOLN
500.0000 mg | Freq: Once | INTRAVENOUS | Status: AC
  Administered 2018-11-11: 500 mg via INTRAVENOUS
  Filled 2018-11-11: qty 100

## 2018-11-11 MED ORDER — SIMETHICONE 80 MG PO CHEW: 40.0000 mg | CHEWABLE_TABLET | Freq: Four times a day (QID) | ORAL | Status: DC | PRN

## 2018-11-11 MED ORDER — OXYCODONE HCL 5 MG PO TABS: 5.0000 mg | ORAL_TABLET | Freq: Once | ORAL | Status: DC | PRN

## 2018-11-11 MED ORDER — POLYETHYLENE GLYCOL 3350 17 G PO PACK
17.0000 g | PACK | Freq: Every day | ORAL | Status: DC | PRN
  Administered 2018-11-12: 17 g via ORAL
  Filled 2018-11-11: qty 1

## 2018-11-11 MED ORDER — METHOCARBAMOL 1000 MG/10ML IJ SOLN
1000.0000 mg | Freq: Four times a day (QID) | INTRAVENOUS | Status: DC | PRN
  Filled 2018-11-11: qty 10

## 2018-11-11 MED ORDER — BUPIVACAINE-EPINEPHRINE 0.25% -1:200000 IJ SOLN
INTRAMUSCULAR | Status: DC | PRN
  Administered 2018-11-11: 30 mL

## 2018-11-11 MED ORDER — FENTANYL CITRATE (PF) 100 MCG/2ML IJ SOLN
25.0000 ug | INTRAMUSCULAR | Status: DC | PRN
  Administered 2018-11-11: 50 ug via INTRAVENOUS

## 2018-11-11 MED ORDER — DIPHENHYDRAMINE HCL 50 MG/ML IJ SOLN: 12.5000 mg | Freq: Four times a day (QID) | INTRAMUSCULAR | Status: DC | PRN

## 2018-11-11 MED ORDER — MAGIC MOUTHWASH
15.0000 mL | Freq: Four times a day (QID) | ORAL | Status: DC | PRN
  Filled 2018-11-11: qty 15

## 2018-11-11 MED ORDER — GABAPENTIN 300 MG PO CAPS
300.0000 mg | ORAL_CAPSULE | Freq: Two times a day (BID) | ORAL | Status: DC
  Administered 2018-11-11 – 2018-11-12 (×2): 300 mg via ORAL
  Filled 2018-11-11 (×2): qty 1

## 2018-11-11 MED ORDER — SODIUM CHLORIDE 0.9 % IV SOLN
INTRAVENOUS | Status: DC
  Filled 2018-11-11: qty 6

## 2018-11-11 MED ORDER — PIPERACILLIN-TAZOBACTAM 3.375 G IVPB
3.3750 g | Freq: Three times a day (TID) | INTRAVENOUS | Status: DC
  Administered 2018-11-11 – 2018-11-12 (×2): 3.375 g via INTRAVENOUS
  Filled 2018-11-11 (×3): qty 50

## 2018-11-11 MED ORDER — LACTATED RINGERS IV SOLN
INTRAVENOUS | Status: DC
  Administered 2018-11-11: 15:00:00 via INTRAVENOUS

## 2018-11-11 MED ORDER — PHENYLEPHRINE 40 MCG/ML (10ML) SYRINGE FOR IV PUSH (FOR BLOOD PRESSURE SUPPORT)
PREFILLED_SYRINGE | INTRAVENOUS | Status: AC
  Filled 2018-11-11: qty 10

## 2018-11-11 MED ORDER — PROPOFOL 10 MG/ML IV BOLUS
INTRAVENOUS | Status: DC | PRN
  Administered 2018-11-11: 200 mg via INTRAVENOUS

## 2018-11-11 MED ORDER — PIPERACILLIN-TAZOBACTAM 3.375 G IVPB 30 MIN
3.3750 g | INTRAVENOUS | Status: DC
  Filled 2018-11-11 (×2): qty 50

## 2018-11-11 MED ORDER — BISACODYL 10 MG RE SUPP: 10.0000 mg | Freq: Every day | RECTAL | Status: DC | PRN

## 2018-11-11 MED ORDER — SODIUM CHLORIDE 0.9 % IV SOLN
INTRAVENOUS | Status: DC | PRN
  Administered 2018-11-11: 17:00:00 1000 mL via INTRAPERITONEAL

## 2018-11-11 MED ORDER — ROCURONIUM BROMIDE 10 MG/ML (PF) SYRINGE
PREFILLED_SYRINGE | INTRAVENOUS | Status: DC | PRN
  Administered 2018-11-11: 50 mg via INTRAVENOUS

## 2018-11-11 MED ORDER — LACTATED RINGERS IR SOLN
Status: DC | PRN
  Administered 2018-11-11: 1000 mL

## 2018-11-11 MED ORDER — METOPROLOL TARTRATE 5 MG/5ML IV SOLN: 5.0000 mg | Freq: Four times a day (QID) | INTRAVENOUS | Status: DC | PRN

## 2018-11-11 MED ORDER — SUCCINYLCHOLINE CHLORIDE 200 MG/10ML IV SOSY
PREFILLED_SYRINGE | INTRAVENOUS | Status: AC
  Filled 2018-11-11: qty 10

## 2018-11-11 MED ORDER — ACETAMINOPHEN 500 MG PO TABS
1000.0000 mg | ORAL_TABLET | Freq: Three times a day (TID) | ORAL | Status: DC
  Administered 2018-11-11 – 2018-11-12 (×3): 1000 mg via ORAL
  Filled 2018-11-11 (×3): qty 2

## 2018-11-11 MED ORDER — HYDRALAZINE HCL 20 MG/ML IJ SOLN: 5.0000 mg | INTRAMUSCULAR | Status: DC | PRN

## 2018-11-11 MED ORDER — ALUM & MAG HYDROXIDE-SIMETH 200-200-20 MG/5ML PO SUSP: 30.0000 mL | Freq: Four times a day (QID) | ORAL | Status: DC | PRN

## 2018-11-11 MED ORDER — OXYCODONE HCL 5 MG PO TABS
5.0000 mg | ORAL_TABLET | ORAL | Status: DC | PRN
  Administered 2018-11-12: 5 mg via ORAL
  Filled 2018-11-11: qty 1

## 2018-11-11 MED ORDER — ASPIRIN 81 MG PO CHEW
81.0000 mg | CHEWABLE_TABLET | Freq: Every day | ORAL | Status: DC
  Administered 2018-11-12: 81 mg via ORAL
  Filled 2018-11-11: qty 1

## 2018-11-11 MED ORDER — LIDOCAINE 2% (20 MG/ML) 5 ML SYRINGE
INTRAMUSCULAR | Status: AC
  Filled 2018-11-11: qty 5

## 2018-11-11 MED ORDER — HYDROMORPHONE HCL 1 MG/ML IJ SOLN: 0.5000 mg | INTRAMUSCULAR | Status: DC | PRN

## 2018-11-11 MED ORDER — IOHEXOL 300 MG/ML  SOLN
100.0000 mL | Freq: Once | INTRAMUSCULAR | Status: AC | PRN
  Administered 2018-11-11: 100 mL via INTRAVENOUS

## 2018-11-11 MED ORDER — DEXAMETHASONE SODIUM PHOSPHATE 10 MG/ML IJ SOLN
INTRAMUSCULAR | Status: AC
  Filled 2018-11-11: qty 1

## 2018-11-11 SURGICAL SUPPLY — 50 items
APL PRP STRL LF DISP 70% ISPRP (MISCELLANEOUS) ×1
APPLIER CLIP ROT 10 11.4 M/L (STAPLE)
APR CLP MED LRG 11.4X10 (STAPLE)
BAG SPEC RTRVL 10 TROC 200 (ENDOMECHANICALS) ×1
CABLE HIGH FREQUENCY MONO STRZ (ELECTRODE) ×2 IMPLANT
CHLORAPREP W/TINT 26 (MISCELLANEOUS) ×2 IMPLANT
CLIP APPLIE ROT 10 11.4 M/L (STAPLE) IMPLANT
COVER SURGICAL LIGHT HANDLE (MISCELLANEOUS) ×2 IMPLANT
COVER WAND RF STERILE (DRAPES) IMPLANT
CUTTER FLEX LINEAR 45M (STAPLE) ×1 IMPLANT
DECANTER SPIKE VIAL GLASS SM (MISCELLANEOUS) ×2 IMPLANT
DEVICE TROCAR PUNCTURE CLOSURE (ENDOMECHANICALS) IMPLANT
DRAPE LAPAROSCOPIC ABDOMINAL (DRAPES) ×2 IMPLANT
DRAPE WARM FLUID 44X44 (DRAPE) ×2 IMPLANT
DRSG TEGADERM 2-3/8X2-3/4 SM (GAUZE/BANDAGES/DRESSINGS) ×4 IMPLANT
DRSG TEGADERM 4X4.75 (GAUZE/BANDAGES/DRESSINGS) ×2 IMPLANT
ELECT REM PT RETURN 15FT ADLT (MISCELLANEOUS) ×2 IMPLANT
ENDOLOOP SUT PDS II  0 18 (SUTURE)
ENDOLOOP SUT PDS II 0 18 (SUTURE) IMPLANT
GAUZE SPONGE 2X2 8PLY STRL LF (GAUZE/BANDAGES/DRESSINGS) ×1 IMPLANT
GLOVE BIOGEL PI IND STRL 7.5 (GLOVE) IMPLANT
GLOVE BIOGEL PI INDICATOR 7.5 (GLOVE) ×4
GLOVE ECLIPSE 8.0 STRL XLNG CF (GLOVE) ×2 IMPLANT
GLOVE INDICATOR 8.0 STRL GRN (GLOVE) ×2 IMPLANT
GLOVE SURG SS PI 7.0 STRL IVOR (GLOVE) ×1 IMPLANT
GOWN STRL REUS W/TWL XL LVL3 (GOWN DISPOSABLE) ×5 IMPLANT
IRRIG SUCT STRYKERFLOW 2 WTIP (MISCELLANEOUS) ×2
IRRIGATION SUCT STRKRFLW 2 WTP (MISCELLANEOUS) ×1 IMPLANT
KIT TURNOVER KIT A (KITS) ×1 IMPLANT
PAD POSITIONING PINK XL (MISCELLANEOUS) ×2 IMPLANT
POUCH RETRIEVAL ECOSAC 10 (ENDOMECHANICALS) IMPLANT
POUCH RETRIEVAL ECOSAC 10MM (ENDOMECHANICALS) ×1
RELOAD 45 VASCULAR/THIN (ENDOMECHANICALS) IMPLANT
RELOAD STAPLE 45 2.5 WHT GRN (ENDOMECHANICALS) IMPLANT
RELOAD STAPLE 45 3.5 BLU ETS (ENDOMECHANICALS) IMPLANT
RELOAD STAPLE TA45 3.5 REG BLU (ENDOMECHANICALS) ×4 IMPLANT
SCISSORS LAP 5X35 DISP (ENDOMECHANICALS) ×2 IMPLANT
SET TUBE SMOKE EVAC HIGH FLOW (TUBING) ×2 IMPLANT
SHEARS HARMONIC ACE PLUS 36CM (ENDOMECHANICALS) ×1 IMPLANT
SLEEVE XCEL OPT CAN 5 100 (ENDOMECHANICALS) ×2 IMPLANT
SPONGE GAUZE 2X2 STER 10/PKG (GAUZE/BANDAGES/DRESSINGS) ×1
SUT MNCRL AB 4-0 PS2 18 (SUTURE) ×2 IMPLANT
SUT PDS AB 0 CT1 36 (SUTURE) IMPLANT
SUT PDS AB 1 CT1 27 (SUTURE) ×1 IMPLANT
SUT SILK 2 0 SH (SUTURE) IMPLANT
TOWEL OR 17X26 10 PK STRL BLUE (TOWEL DISPOSABLE) ×2 IMPLANT
TRAY FOLEY MTR SLVR 14FR STAT (SET/KITS/TRAYS/PACK) ×1 IMPLANT
TRAY LAPAROSCOPIC (CUSTOM PROCEDURE TRAY) ×2 IMPLANT
TROCAR BLADELESS OPT 5 100 (ENDOMECHANICALS) ×2 IMPLANT
TROCAR XCEL 12X100 BLDLESS (ENDOMECHANICALS) ×2 IMPLANT

## 2018-11-11 NOTE — Discharge Instructions (Signed)
SURGERY: POST OP INSTRUCTIONS °(Surgery for small bowel obstruction, colon resection, etc) ° ° °###################################################################### ° °EAT °Gradually transition to a high fiber diet with a fiber supplement over the next few days after discharge ° °WALK °Walk an hour a day.  Control your pain to do that.   ° °CONTROL PAIN °Control pain so that you can walk, sleep, tolerate sneezing/coughing, go up/down stairs. ° °HAVE A BOWEL MOVEMENT DAILY °Keep your bowels regular to avoid problems.  OK to try a laxative to override constipation.  OK to use an antidairrheal to slow down diarrhea.  Call if not better after 2 tries ° °CALL IF YOU HAVE PROBLEMS/CONCERNS °Call if you are still struggling despite following these instructions. °Call if you have concerns not answered by these instructions ° °###################################################################### ° ° °DIET °Follow a light diet the first few days at home.  Start with a bland diet such as soups, liquids, starchy foods, low fat foods, etc.  If you feel full, bloated, or constipated, stay on a ful liquid or pureed/blenderized diet for a few days until you feel better and no longer constipated. °Be sure to drink plenty of fluids every day to avoid getting dehydrated (feeling dizzy, not urinating, etc.). °Gradually add a fiber supplement to your diet over the next week.  Gradually get back to a regular solid diet.  Avoid fast food or heavy meals the first week as you are more likely to get nauseated. °It is expected for your digestive tract to need a few months to get back to normal.  It is common for your bowel movements and stools to be irregular.  You will have occasional bloating and cramping that should eventually fade away.  Until you are eating solid food normally, off all pain medications, and back to regular activities; your bowels will not be normal. °Focus on eating a low-fat, high fiber diet the rest of your life  (See Getting to Good Bowel Health, below). ° °CARE of your INCISION or WOUND °It is good for closed incision and even open wounds to be washed every day.  Shower every day.  Short baths are fine.  Wash the incisions and wounds clean with soap & water.    °If you have a closed incision(s), wash the incision with soap & water every day.  You may leave closed incisions open to air if it is dry.   You may cover the incision with clean gauze & replace it after your daily shower for comfort. °If you have skin tapes (Steristrips) or skin glue (Dermabond) on your incision, leave them in place.  They will fall off on their own like a scab.  You may trim any edges that curl up with clean scissors.  If you have staples, set up an appointment for them to be removed in the office in 10 days after surgery.  °If you have a drain, wash around the skin exit site with soap & water and place a new dressing of gauze or band aid around the skin every day.  Keep the drain site clean & dry.    °If you have an open wound with packing, see wound care instructions.  In general, it is encouraged that you remove your dressing and packing, shower with soap & water, and replace your dressing once a day.  Pack the wound with clean gauze moistened with normal (0.9%) saline to keep the wound moist & uninfected.  Pressure on the dressing for 30 minutes will stop most wound   bleeding.  Eventually your body will heal & pull the open wound closed over the next few months.  °Raw open wounds will occasionally bleed or secrete yellow drainage until it heals closed.  Drain sites will drain a little until the drain is removed.  Even closed incisions can have mild bleeding or drainage the first few days until the skin edges scab over & seal.   °If you have an open wound with a wound vac, see wound vac care instructions. ° ° ° ° °ACTIVITIES as tolerated °Start light daily activities --- self-care, walking, climbing stairs-- beginning the day after surgery.   Gradually increase activities as tolerated.  Control your pain to be active.  Stop when you are tired.  Ideally, walk several times a day, eventually an hour a day.   °Most people are back to most day-to-day activities in a few weeks.  It takes 4-8 weeks to get back to unrestricted, intense activity. °If you can walk 30 minutes without difficulty, it is safe to try more intense activity such as jogging, treadmill, bicycling, low-impact aerobics, swimming, etc. °Save the most intensive and strenuous activity for last (Usually 4-8 weeks after surgery) such as sit-ups, heavy lifting, contact sports, etc.  Refrain from any intense heavy lifting or straining until you are off narcotics for pain control.  You will have off days, but things should improve week-by-week. °DO NOT PUSH THROUGH PAIN.  Let pain be your guide: If it hurts to do something, don't do it.  Pain is your body warning you to avoid that activity for another week until the pain goes down. °You may drive when you are no longer taking narcotic prescription pain medication, you can comfortably wear a seatbelt, and you can safely make sudden turns/stops to protect yourself without hesitating due to pain. °You may have sexual intercourse when it is comfortable. If it hurts to do something, stop. ° °MEDICATIONS °Take your usually prescribed home medications unless otherwise directed.   °Blood thinners:  °Usually you can restart any strong blood thinners after the second postoperative day.  It is OK to take aspirin right away.    ° If you are on strong blood thinners (warfarin/Coumadin, Plavix, Xerelto, Eliquis, Pradaxa, etc), discuss with your surgeon, medicine PCP, and/or cardiologist for instructions on when to restart the blood thinner & if blood monitoring is needed (PT/INR blood check, etc).   ° ° °PAIN CONTROL °Pain after surgery or related to activity is often due to strain/injury to muscle, tendon, nerves and/or incisions.  This pain is usually  short-term and will improve in a few months.  °To help speed the process of healing and to get back to regular activity more quickly, DO THE FOLLOWING THINGS TOGETHER: °1. Increase activity gradually.  DO NOT PUSH THROUGH PAIN °2. Use Ice and/or Heat °3. Try Gentle Massage and/or Stretching °4. Take over the counter pain medication °5. Take Narcotic prescription pain medication for more severe pain ° °Good pain control = faster recovery.  It is better to take more medicine to be more active than to stay in bed all day to avoid medications. °1.  Increase activity gradually °Avoid heavy lifting at first, then increase to lifting as tolerated over the next 6 weeks. °Do not “push through” the pain.  Listen to your body and avoid positions and maneuvers than reproduce the pain.  Wait a few days before trying something more intense °Walking an hour a day is encouraged to help your body recover faster   and more safely.  Start slowly and stop when getting sore.  If you can walk 30 minutes without stopping or pain, you can try more intense activity (running, jogging, aerobics, cycling, swimming, treadmill, sex, sports, weightlifting, etc.) °Remember: If it hurts to do it, then don’t do it! °2. Use Ice and/or Heat °You will have swelling and bruising around the incisions.  This will take several weeks to resolve. °Ice packs or heating pads (6-8 times a day, 30-60 minutes at a time) will help sooth soreness & bruising. °Some people prefer to use ice alone, heat alone, or alternate between ice & heat.  Experiment and see what works best for you.  Consider trying ice for the first few days to help decrease swelling and bruising; then, switch to heat to help relax sore spots and speed recovery. °Shower every day.  Short baths are fine.  It feels good!  Keep the incisions and wounds clean with soap & water.   °3. Try Gentle Massage and/or Stretching °Massage at the area of pain many times a day °Stop if you feel pain - do not  overdo it °4. Take over the counter pain medication °This helps the muscle and nerve tissues become less irritable and calm down faster °Choose ONE of the following over-the-counter anti-inflammatory medications: °Acetaminophen 500mg tabs (Tylenol) 1-2 pills with every meal and just before bedtime (avoid if you have liver problems or if you have acetaminophen in you narcotic prescription) °Naproxen 220mg tabs (ex. Aleve, Naprosyn) 1-2 pills twice a day (avoid if you have kidney, stomach, IBD, or bleeding problems) °Ibuprofen 200mg tabs (ex. Advil, Motrin) 3-4 pills with every meal and just before bedtime (avoid if you have kidney, stomach, IBD, or bleeding problems) °Take with food/snack several times a day as directed for at least 2 weeks to help keep pain / soreness down & more manageable. °5. Take Narcotic prescription pain medication for more severe pain °A prescription for strong pain control is often given to you upon discharge (for example: oxycodone/Percocet, hydrocodone/Norco/Vicodin, or tramadol/Ultram) °Take your pain medication as prescribed. °Be mindful that most narcotic prescriptions contain Tylenol (acetaminophen) as well - avoid taking too much Tylenol. °If you are having problems/concerns with the prescription medicine (does not control pain, nausea, vomiting, rash, itching, etc.), please call us [phone] to see if we need to switch you to a different pain medicine that will work better for you and/or control your side effects better. °If you need a refill on your pain medication, you must call the office before 4 pm and on weekdays only.  By federal law, prescriptions for narcotics cannot be called into a pharmacy.  They must be filled out on paper & picked up from our office by the patient or authorized caretaker.  Prescriptions cannot be filled after 4 pm nor on weekends.   ° °WHEN TO CALL US [phone] °Severe uncontrolled or worsening pain  °Fever over 101 F (38.5 C) °Concerns with  the incision: Worsening pain, redness, rash/hives, swelling, bleeding, or drainage °Reactions / problems with new medications (itching, rash, hives, nausea, etc.) °Nausea and/or vomiting °Difficulty urinating °Difficulty breathing °Worsening fatigue, dizziness, lightheadedness, blurred vision °Other concerns °If you are not getting better after two weeks or are noticing you are getting worse, contact our office [phone] for further advice.  We may need to adjust your medications, re-evaluate you in the office, send you to the emergency room, or see what other things we can do to help. °The   clinic staff is available to answer your questions during regular business hours (8:30am-5pm).  Please don’t hesitate to call and ask to speak to one of our nurses for clinical concerns.    °A surgeon from [facility] is always on call at the hospitals 24 hours/day °If you have a medical emergency, go to the nearest emergency room or call 911. ° °FOLLOW UP in our office °One the day of your discharge from the hospital (or the next business weekday), please call [facility] to set up or confirm an appointment to see your surgeon in the office for a follow-up appointment.  Usually it is 2-3 weeks after your surgery.   °If you have skin staples at your incision(s), let the office know so we can set up a time in the office for the nurse to remove them (usually around 10 days after surgery). °Make sure that you call for appointments the day of discharge (or the next business weekday) from the hospital to ensure a convenient appointment time. °IF YOU HAVE DISABILITY OR FAMILY LEAVE FORMS, BRING THEM TO THE OFFICE FOR PROCESSING.  DO NOT GIVE THEM TO YOUR DOCTOR. ° °[facility] °[address], [address], [place],   [address] ? °[phone] - Main °[phone] - Toll Free,  [phone] - Fax °[email or web address] ° °GETTING TO GOOD BOWEL HEALTH. °It is  expected for your digestive tract to need a few months to get back to normal.  It is common for your bowel movements and stools to be irregular.  You will have occasional bloating and cramping that should eventually fade away.  Until you are eating solid food normally, off all pain medications, and back to regular activities; your bowels will not be normal.   °Avoiding constipation °The goal: ONE SOFT BOWEL MOVEMENT A DAY!    °Drink plenty of fluids.  Choose water first. °TAKE A FIBER SUPPLEMENT EVERY DAY THE REST OF YOUR LIFE °During your first week back home, gradually add back a fiber supplement every day °Experiment which form you can tolerate.   There are many forms such as powders, tablets, wafers, gummies, etc °Psyllium bran (Metamucil), methylcellulose (Citrucel), Miralax or Glycolax, Benefiber, Flax Seed.  °Adjust the dose week-by-week (1/2 dose/day to 6 doses a day) until you are moving your bowels 1-2 times a day.  Cut back the dose or try a different fiber product if it is giving you problems such as diarrhea or bloating. °Sometimes a laxative is needed to help jump-start bowels if constipated until the fiber supplement can help regulate your bowels.  If you are tolerating eating & you are farting, it is okay to try a gentle laxative such as double dose MiraLax, prune juice, or Milk of Magnesia.  Avoid using laxatives too often. °Stool softeners can sometimes help counteract the constipating effects of narcotic pain medicines.  It can also cause diarrhea, so avoid using for too long. °If you are still constipated despite taking fiber daily, eating solids, and a few doses of laxatives, call our office. °Controlling diarrhea °Try drinking liquids and eating bland foods for a few days to avoid stressing your intestines further. °Avoid dairy products (especially milk & ice cream) for a short time.  The intestines often can lose the ability to digest lactose when stressed. °Avoid foods that cause gassiness or  bloating.  Typical foods include beans and other legumes, cabbage, broccoli, and dairy foods.  Avoid greasy, spicy, fast foods.  Every person has   some sensitivity to other foods, so listen to your body and avoid those foods that trigger problems for you. Probiotics (such as active yogurt, Align, etc) may help repopulate the intestines and colon with normal bacteria and calm down a sensitive digestive tract Adding a fiber supplement gradually can help thicken stools by absorbing excess fluid and retrain the intestines to act more normally.  Slowly increase the dose over a few weeks.  Too much fiber too soon can backfire and cause cramping & bloating. It is okay to try and slow down diarrhea with a few doses of antidiarrheal medicines.   Bismuth subsalicylate (ex. Kayopectate, Pepto Bismol) for a few doses can help control diarrhea.  Avoid if pregnant.   Loperamide (Imodium) can slow down diarrhea.  Start with one tablet (2mg ) first.  Avoid if you are having fevers or severe pain.  ILEOSTOMY PATIENTS WILL HAVE CHRONIC DIARRHEA since their colon is not in use.    Drink plenty of liquids.  You will need to drink even more glasses of water/liquid a day to avoid getting dehydrated. Record output from your ileostomy.  Expect to empty the bag every 3-4 hours at first.  Most people with a permanent ileostomy empty their bag 4-6 times at the least.   Use antidiarrheal medicine (especially Imodium) several times a day to avoid getting dehydrated.  Start with a dose at bedtime & breakfast.  Adjust up or down as needed.  Increase antidiarrheal medications as directed to avoid emptying the bag more than 8 times a day (every 3 hours). Work with your wound ostomy nurse to learn care for your ostomy.  See ostomy care instructions. TROUBLESHOOTING IRREGULAR BOWELS 1) Start with a soft & bland diet. No spicy, greasy, or fried foods.  2) Avoid gluten/wheat or dairy products from diet to see if symptoms improve. 3) Miralax  17gm or flax seed mixed in [place]. water or juice-daily. May use 2-4 times a day as needed. 4) Gas-X, Phazyme, etc. as needed for gas & bloating.  5) Prilosec (omeprazole) over-the-counter as needed 6)  Consider probiotics (Align, Activa, etc) to help calm the bowels down  Call your doctor if you are getting worse or not getting better.  Sometimes further testing (cultures, endoscopy, X-ray studies, CT scans, bloodwork, etc.) may be needed to help diagnose and treat the cause of the diarrhea. [facility], [place], [place], [place], [place]  [address] [phone] - Main.    [phone]  - Toll Free.   [phone] - Fax [email or web address]     Appendicitis, Adult  Appendicitis is inflammation of the appendix. The appendix is a finger-shaped tube that is attached to the large intestine. If appendicitis is not treated, it can cause the appendix to tear (rupture). A ruptured appendix can lead to a life-threatening infection. It can also cause a painful collection of pus (abscess) to form in the appendix. What are the causes? This condition may be caused by a blockage in the appendix that leads to infection. The blockage can be caused by:  A ball of stool (feces).  Enlarged lymph glands. In some cases, the cause may not be known. What increases the risk? Age is a risk factor. You are more likely to develop this condition if you are between 17 and 38 years of age. What are the signs or symptoms? Symptoms of this condition include:  Pain that starts around the belly button and moves toward the lower right part of the abdomen.  The pain can become more severe as time passes. It gets worse with coughing or sudden movements.  Tenderness in the lower right abdomen.  Nausea.  Vomiting.  Loss of appetite.  Fever.  Difficulty passing stool (constipation).  Passing very loose stools (diarrhea).  Generally feeling unwell. How is this  diagnosed? This condition may be diagnosed with:  A physical exam.  Blood tests.  Urine test. To confirm the diagnosis, an ultrasound, MRI, or CT scan may be done. How is this treated? This condition is usually treated with surgery to remove the appendix (appendectomy). There are two methods for doing an appendectomy:  Open appendectomy. In this surgery, the appendix is removed through a large incision that is made in the lower right abdomen. This procedure may be recommended if: ? You have major scarring from a previous surgery. ? You have a bleeding disorder. ? You are pregnant and are about to give birth. ? You have a condition that makes it hard to do surgery through small incisions (laparoscopic procedure). This includes severe infection or a ruptured appendix.  Laparoscopic appendectomy. In this surgery, the appendix is removed through small incisions. This procedure usually causes less pain and fewer problems than an open appendectomy. It also has a shorter recovery time. If the appendix has ruptured and an abscess has formed:  A drain may be placed into the abscess to remove fluid.  Antibiotic medicines may be given through an IV.  The appendix may or may not need to be removed. Follow these instructions at home: If you had surgery, follow instructions from your health care provider about how to care for yourself at home and how to care for your incision. Medicines  Take over-the-counter and prescription medicines only as told by your health care provider.  If you were prescribed an antibiotic medicine, take it as told by your health care provider. Do not stop taking the antibiotic even if you start to feel better. Eating and drinking  Follow instructions from your health care provider about eating restrictions. You may slowly resume a regular diet once your nausea or vomiting stops. General instructions  Do not use any products that contain nicotine or tobacco, such as  cigarettes, e-cigarettes, and chewing tobacco. If you need help quitting, ask your health care provider.  Do not drive or use heavy machinery while taking prescription pain medicine.  Ask your health care provider if the medicine prescribed to you can cause constipation. You may need to take steps to prevent or treat constipation, such as: ? Drink enough fluid to keep your urine pale yellow. ? Take over-the-counter or prescription medicines. ? Eat foods that are high in fiber, such as beans, whole grains, and fresh fruits and vegetables. ? Limit foods that are high in fat and processed sugars, such as fried or sweet foods.  Keep all follow-up visits as told by your health care provider. This is important. Contact a health care provider if:  There is pus, blood, or excessive drainage coming from your incision.  You have nausea or vomiting. Get help right away if you have:  Worsening abdominal pain.  A fever.  Chills.  Fatigue.  Muscle aches.  Shortness of breath. Summary  Appendicitis is inflammation of the appendix.  This condition may be caused by a blockage in the appendix that leads to infection.  This condition is usually treated with surgery to remove the appendix. This information is not intended to replace advice given to you by your health  care provider. Make sure you discuss any questions you have with your health care provider. Document Released: 06/29/2005 Document Revised: 12/15/2017 Document Reviewed: 12/15/2017 Elsevier Interactive Patient Education  2019 [employer].

## 2018-11-11 NOTE — ED Provider Notes (Signed)
[facility] EMERGENCY DEPARTMENT Provider Note   CSN: [id number] Arrival date & time: 11/11/18  0825    History   Chief Complaint Chief Complaint  Patient presents with  . Abdominal Pain    HPI ROSANA FARNELL is a 68 y.o. female.     HPI   68 year old female with abdominal pain.  Onset about 2 days ago.  Progressively worsening.  Pain began around the bellybutton.  Has subsequently moved to the right lower quadrant.  She feels fairly comfortable at rest.  The pain is significantly worse when she moves.  No vomiting or diarrhea.  No urinary complaints.  No fevers.  Surgical history significant for 3 prior C-sections.  Past Medical History:  Diagnosis Date  . Hyperlipidemia   . Hypertension   . Obesity   . Sjoegren syndrome     Patient Active Problem List   Diagnosis Date Noted  . ANKLE PAIN, LEFT 10/17/2007  . HYPERLIPIDEMIA 02/24/2007  . HYPERTENSION 02/24/2007    Past Surgical History:  Procedure Laterality Date  . [place], 1986     OB History    Gravida  3   Para  3   Term      Preterm      AB      Living  3     SAB      TAB      Ectopic      Multiple      Live Births               Home Medications    Prior to Admission medications   Medication Sig Start Date End Date Taking? Authorizing Provider  atorvastatin (LIPITOR) 20 MG tablet Take 20 mg by mouth daily.   Yes [provider]  amLODipine (NORVASC) 10 MG tablet Take 10 mg by mouth daily.    [provider]  Aspirin (BAYER LOW DOSE PO) Take by mouth.    [provider]  Cholecalciferol (VITAMIN D) 2000 UNITS tablet Take 2,000 Units by mouth daily.    [provider]  Estradiol (VAGIFEM) 10 MCG TABS Place 1 tablet (10 mcg total) vaginally 2 (two) times a week. 07/28/12   Ena Dawley, MD  hydrochlorothiazide (HYDRODIURIL) 25 MG tablet Take 25 mg by mouth daily.    [provider]    Family History  Family History  Problem Relation Age of Onset  . Colon cancer Maternal Grandmother 48  . Asthma Other   . Hypertension Other     Social History Social History   Tobacco Use  . Smoking status: Never Smoker  . Smokeless tobacco: Never Used  Substance Use Topics  . Alcohol use: Yes    Alcohol/week: 2.0 standard drinks    Types: 2 Glasses of wine per week  . Drug use: No     Allergies   Patient has no known allergies.   Review of Systems Review of Systems  All systems reviewed and negative, other than as noted in HPI.  Physical Exam Updated Vital Signs BP 117/82 (BP Location: Right Arm)   Pulse 99   Temp 98.6 F (37 C) (Oral)   Resp 16   Ht 5' 5.5" (1.664 m)   Wt 93 kg   SpO2 99%   BMI 33.59 kg/m   Physical Exam Vitals signs and nursing note reviewed.  Constitutional:      General: She is not in acute distress.    Appearance: She is  well-developed.  HENT:     Head: Normocephalic and atraumatic.  Eyes:     General:        Right eye: No discharge.        Left eye: No discharge.     Conjunctiva/sclera: Conjunctivae normal.  Neck:     Musculoskeletal: Neck supple.  Cardiovascular:     Rate and Rhythm: Normal rate and regular rhythm.     Heart sounds: Normal heart sounds. No murmur. No friction rub. No gallop.   Pulmonary:     Effort: Pulmonary effort is normal. No respiratory distress.     Breath sounds: Normal breath sounds.  Abdominal:     General: There is no distension.     Palpations: Abdomen is soft.     Tenderness: There is no abdominal tenderness.     Comments: Tenderness suprapubically and in the right lower quadrant.  More focally tender in the right lower quadrant.  No rebound or guarding.  No distention.  Musculoskeletal:        General: No tenderness.  Skin:    General: Skin is warm and dry.  Neurological:     Mental Status: She is alert.  Psychiatric:        Behavior: Behavior normal.        Thought Content: Thought content normal.       ED Treatments / Results  Labs (all labs ordered are listed, but only abnormal results are displayed) Labs Reviewed  COMPREHENSIVE METABOLIC PANEL - Abnormal; Notable for the following components:      Result Value   Glucose, Bld 117 (*)    GFR calc non Af Amer 59 (*)    All other components within normal limits  URINALYSIS, ROUTINE W REFLEX MICROSCOPIC - Abnormal; Notable for the following components:   APPearance CLOUDY (*)    Leukocytes,Ua MODERATE (*)    All other components within normal limits  URINALYSIS, MICROSCOPIC (REFLEX) - Abnormal; Notable for the following components:   Bacteria, UA MANY (*)    All other components within normal limits  URINE CULTURE  CBC WITH DIFFERENTIAL/PLATELET  LIPASE, BLOOD    EKG None  Radiology Ct Abdomen Pelvis W Contrast  Result Date: 11/11/2018 CLINICAL DATA:  Right abdomen pain for 2 days. EXAM: CT ABDOMEN AND PELVIS WITH CONTRAST TECHNIQUE: Multidetector CT imaging of the abdomen and pelvis was performed using the standard protocol following bolus administration of intravenous contrast. CONTRAST:  149mL OMNIPAQUE IOHEXOL 300 MG/ML  SOLN COMPARISON:  None. FINDINGS: Lower chest: There is a calcified granuloma in the posterior basal segment of the right lower lobe. Peripheral scar versus atelectasis is identified in bilateral lung bases. The heart size is normal. Hepatobiliary: Calcified granulomas are identified in the liver. No focal liver lesion is identified. There is a gallstone in the gallbladder. There is no pericholecystic fluid. The biliary tree is normal. Pancreas: Unremarkable. No pancreatic ductal dilatation or surrounding inflammatory changes. Spleen: Calcified granulomas are identified in the spleen. The spleen is otherwise normal. Adrenals/Urinary Tract: The bilateral adrenal glands are normal. Small cysts are identified in bilateral kidneys. There is no hydronephrosis bilaterally. Partial fluid-filled bladder is normal.  Stomach/Bowel: There is dilated appendix with enhancing wall and surrounding inflammation measuring 1.5 cm in diameter. Fluid is noted surrounding the appendix suggesting periappendiceal abscess. There is no small bowel obstruction. There is diverticulosis of colon without diverticulitis. Minimal hiatal hernia is identified. The stomach is otherwise normal. Vascular/Lymphatic: Aortic atherosclerosis. No enlarged abdominal or pelvic lymph nodes.  Reproductive: Uterus and bilateral adnexa are unremarkable. Other: None. Musculoskeletal: Degenerative joint changes of the spine are identified. IMPRESSION: Findings consistent with acute appendicitis with dilated appendix with enhancing wall and surrounding inflammation measuring 1.5 cm in diameter. Fluid is noted surrounding the appendix suggesting periappendiceal abscess and appendiceal rupture is not excluded. Small cysts in both kidneys. Calcified granulomas in the liver, spleen, and right lung base. These results will be called to the ordering clinician or representative by the Radiologist Assistant, and communication documented in the PACS or zVision Dashboard. Electronically Signed   By: Abelardo Diesel M.D.   On: 11/11/2018 10:38    Procedures Procedures (including critical care time)  Medications Ordered in ED Medications  0.9 %  sodium chloride infusion ( Intravenous New Bag/Given 11/11/18 0920)  cefTRIAXone (ROCEPHIN) 1 g in sodium chloride 0.9 % 100 mL IVPB (1 g Intravenous New Bag/Given 11/11/18 1035)  metroNIDAZOLE (FLAGYL) IVPB 500 mg (has no administration in time range)  iohexol (OMNIPAQUE) 300 MG/ML solution 100 mL (100 mLs Intravenous Contrast Given 11/11/18 1005)     Initial Impression / Assessment and Plan / ED Course  I have reviewed the triage vital signs and the nursing notes.  Pertinent labs & imaging results that were available during my care of the patient were reviewed by me and considered in my medical decision making (see chart for  details).        68 year old female with abdominal pain.  History, exam and CT consistent with acute appendicitis..  There is a small amount of fluid around the appendix raising concern for possible abscess.  Discussed case with Dr. Johney Maine, general surgery.  Will transfer to the emergency room at [facility] for surgical evaluation.  Discussed case with Dr. Kathrynn Humble, EM physician. Possible UTI.  Ordered antibiotics to cover for this as well.  Patient updated.  She continues to decline pain medication but is aware that she just needs ask if she feels like something.  Final Clinical Impressions(s) / ED Diagnoses   Final diagnoses:  Acute appendicitis, unspecified acute appendicitis type    ED Discharge Orders    None       Virgel Manifold, MD 11/11/18 1102

## 2018-11-11 NOTE — Anesthesia Postprocedure Evaluation (Signed)
Anesthesia Post Note  Patient: Kristy Sherman  Procedure(s) Performed: APPENDECTOMY LAPAROSCOPIC (N/A Abdomen)     Patient location during evaluation: PACU Anesthesia Type: General Level of consciousness: awake and alert Pain management: pain level controlled Vital Signs Assessment: post-procedure vital signs reviewed and stable Respiratory status: spontaneous breathing, nonlabored ventilation, respiratory function stable and patient connected to nasal cannula oxygen Cardiovascular status: blood pressure returned to baseline and stable Postop Assessment: no apparent nausea or vomiting Anesthetic complications: no    Last Vitals:  Vitals:   11/11/18 1715 11/11/18 1730  BP: 111/68 111/66  Pulse: 72 72  Resp: 18 12  Temp:    SpO2: 100% 93%    Last Pain:  Vitals:   11/11/18 1730  TempSrc:   PainSc: 10-Worst pain ever                 [name] COKER

## 2018-11-11 NOTE — ED Notes (Signed)
Report called to [facility] charge rn

## 2018-11-11 NOTE — Anesthesia Procedure Notes (Signed)
Procedure Name: Intubation Date/Time: 11/11/2018 3:41 PM Performed by: [place] Welcome Fults C, CRNA Pre-anesthesia Checklist: Patient identified, Emergency Drugs available, Suction available and Patient being monitored Patient Re-evaluated:Patient Re-evaluated prior to induction Oxygen Delivery Method: Circle system utilized Preoxygenation: Pre-oxygenation with 100% oxygen Induction Type: IV induction and Rapid sequence Laryngoscope Size: Mac and 3 Tube type: Oral Tube size: 7.0 mm Number of attempts: 1 Airway Equipment and Method: Stylet and Oral airway Placement Confirmation: ETT inserted through vocal cords under direct vision,  positive ETCO2 and breath sounds checked- equal and bilateral Secured at: 21 cm Tube secured with: Tape Dental Injury: Teeth and Oropharynx as per pre-operative assessment

## 2018-11-11 NOTE — ED Triage Notes (Signed)
abd pain x 2 days  Rt lower pain  Denies n/v/d, no dc  No burning w urination

## 2018-11-11 NOTE — Transfer of Care (Signed)
Immediate Anesthesia Transfer of Care Note  Patient: Kristy Sherman  Procedure(s) Performed: APPENDECTOMY LAPAROSCOPIC (N/A Abdomen)  Patient Location: PACU  Anesthesia Type:General  Level of Consciousness: awake, alert , oriented and patient cooperative  Airway & Oxygen Therapy: Patient Spontanous Breathing and Patient connected to face mask oxygen  Post-op Assessment: Report given to RN, Post -op Vital signs reviewed and stable and Patient moving all extremities  Post vital signs: Reviewed and stable  Last Vitals:  Vitals Value Taken Time  BP 106/63 11/11/2018  5:01 PM  Temp 36.4 C 11/11/2018  5:00 PM  Pulse 71 11/11/2018  5:05 PM  Resp 17 11/11/2018  5:05 PM  SpO2 100 % 11/11/2018  5:05 PM  Vitals shown include unvalidated device data.  Last Pain:  Vitals:   11/11/18 1700  TempSrc:   PainSc: (P) 0-No pain         Complications: No apparent anesthesia complications

## 2018-11-11 NOTE — Anesthesia Preprocedure Evaluation (Signed)
Anesthesia Evaluation  Patient identified by MRN, date of birth, ID band Patient awake    Reviewed: Allergy & Precautions, NPO status , Patient's Chart, lab work & pertinent test results  Airway Mallampati: II  TM Distance: >3 FB Neck ROM: Full    Dental  (+) Teeth Intact, Dental Advisory Given   Pulmonary    breath sounds clear to auscultation       Cardiovascular hypertension,  Rhythm:Regular Rate:Normal     Neuro/Psych    GI/Hepatic   Endo/Other    Renal/GU      Musculoskeletal   Abdominal   Peds  Hematology   Anesthesia Other Findings   Reproductive/Obstetrics                            Anesthesia Physical Anesthesia Plan  ASA: II and emergent  Anesthesia Plan: General   Post-op Pain Management:    Induction: Intravenous, Cricoid pressure planned and Rapid sequence  PONV Risk Score and Plan: Ondansetron and Dexamethasone  Airway Management Planned: Oral ETT  Additional Equipment:   Intra-op Plan:   Post-operative Plan: Extubation in OR  Informed Consent: I have reviewed the patients History and Physical, chart, labs and discussed the procedure including the risks, benefits and alternatives for the proposed anesthesia with the patient or authorized representative who has indicated his/her understanding and acceptance.     Dental advisory given  Plan Discussed with: CRNA and Anesthesiologist  Anesthesia Plan Comments:         Anesthesia Quick Evaluation  

## 2018-11-11 NOTE — Op Note (Signed)
11/11/2018  PATIENT:  Kristy Sherman  68 y.o. female  Patient Care Team: Marius Ditch, MD as PCP - General (Internal Medicine) Michael Boston, MD as Consulting Physician (General Surgery) Ena Dawley, MD as Consulting Physician (Obstetrics and Gynecology)  PRE-OPERATIVE DIAGNOSIS:  Perforated Appendicitis  POST-OPERATIVE DIAGNOSIS:  Acute Perforated Appendicitis with peritonitis  PROCEDURE:   LAPAROSCOPIC APPENDECTOMY  DIAGNOSTIC LAPAROSCOPY  SURGEON:  Adin Hector, MD  ANESTHESIA:   local and general  EBL:  Total I/O In: -  Out: 25 [Blood:25]  Delay start of Pharmacological VTE agent (>24hrs) due to surgical blood loss or risk of bleeding:  no  DRAINS: none   SPECIMEN:  APPENDIX  DISPOSITION OF SPECIMEN:  PATHOLOGY  COUNTS:  YES  PLAN OF CARE: Admit to inpatient   PATIENT DISPOSITION:  PACU - hemodynamically stable.   INDICATIONS: Patient with concerning symptoms & work up suspicious for appendicitis.  Surgery was recommended:  The anatomy & physiology of the digestive tract was discussed.  The pathophysiology of appendicitis was discussed.  Natural history risks without surgery was discussed.   I feel the risks of no intervention will lead to serious problems that outweigh the operative risks; therefore, I recommended diagnostic laparoscopy with removal of appendix to remove the pathology.  Laparoscopic & open techniques were discussed.   I noted a good likelihood this will help address the problem.    Risks such as bleeding, infection, abscess, leak, reoperation, possible ostomy, hernia, heart attack, death, and other risks were discussed.  Goals of post-operative recovery were discussed as well.  We will work to minimize complications.  Questions were answered.  The patient expresses understanding & wishes to proceed with surgery.  OR FINDINGS: Focal peritonitis in right lower quadrant with small bowel walling off a very inflamed appendix with edema and  phlegmon consistent with perforation.  No definite gangrene.  Inflammatory peel in suprapubic region and right paracolic gutter primarily.  No evidence of any abnormalities with adnexa, small bowel, or colon.  No cholecystitis.  Perhaps mild fatty change in liver but no certainly no cirrhosis.  No perforated ulcer.  DESCRIPTION:   The patient was identified & brought into the operating room. The patient was positioned supine with arms tucked. SCDs were active during the entire case. The patient underwent general anesthesia without any difficulty.  The abdomen was prepped and draped in a sterile fashion. A Surgical Timeout confirmed our plan.  I made a transverse incision through the superior umbilical fold.  I made a small transverse nick through the infraumbilical fascia and confirmed peritoneal entry.  I placed a 61mm port with optical entry technique using a towel clamp on the umbilicus for fascial countertraction.  Entry was clean..  We induced carbon dioxide insufflation.  Camera inspection revealed no injury.  I placed additional ports under direct laparoscopic visualization.  Could see small bowel adherent to the right lower quadrant with phlegmon.  Some inflammatory peel in the suprapubic region as well.  Able to sweep the inflammatory lesions of small bowel off to expose a very inflamed appendix with edema and some brownish and inflammatory discoloration.  Consistent with perforated appendicitis.  Aspirated the phlegmon and inflammatory discharge a peel and edema.  It did not seem mucinous.  I mobilized the terminal ileum to proximal ascending colon in a lateral to medial fashion.  I took care to avoid injuring any retroperitoneal structures.  I freed the appendix off its attachments to the retroperitoneum and right fallopian tube  and ovary.  I elevated the appendix.  I was able to transect across the mesoappendix until I came to the appendiceal base.  Somewhat inflamed as well but viable.  I  stapled the appendix with nearby cecum using 2 from blue load firings of a 45 mm laparoscopic stapler, taking care to not cramp at the ileocecal valve.  . I took a healthy cuff of viable cecum.  I placed the appendix inside an EndoCatch bag and removed out the 12 mm port.  Used a #1 PDS suture and passed around the 12 mm stapler port site using laparoscopic suture passer under direct realization.  Did 2 sutures and clamped them untied.  I replaced the port.  I did copious irrigation.  I focused on the right lower quadrant and suprapubic region with multiple small aliquots to help catch phlegmon and peel.  Hemostasis was good in the mesoappendix, colon mesentery, and retroperitoneum. Staple line was intact on the cecum with no bleeding.  Then a final irrigation of clindamycin/gentamicin antibiotic irrigation x1 L.  I washed out the pelvis, retrohepatic space and right paracolic gutter. I washed out the left side as well.  I let that stay in place for 10 minutes as I ran the small bowel and completed diagnostic laparoscopy.  Ran the bowel from the ileal cecal region towards the ligament of Treitz.  Inspected the colon from the cecum down to the peritoneal reflection.  Inspected the upper quadrants and omentum as well.  No evidence of carcinomatosis or caking or other abnormalities.  I aspirated the antibiotic irrigation.  Hemostasis is good. There was no perforation or injury.  Because the area cleaned up well after irrigation, I did not place a drain.  I aspirated the carbon dioxide through the laparoscopic suction with a HEPA filter.  I removed the ports.  I tied the #1 PDS suture down, closing the stapler port site in the left lower quadrant.  I closed skin at the 3 port sites using 4-0 monocryl stitch.  Sterile dressings applied.  Patient was extubated and sent to the recovery room.  I suspect the patient is going used in the hospital at least overnight and will need antibiotics for 5 days. I discussed  operative findings, updated the patient's status, discussed probable steps to recovery, and gave postoperative recommendations to the patient's spouse.  Recommendations were made.  Questions were answered.  He expressed understanding & appreciation.    Adin Hector, M.D., F.A.C.S. Gastrointestinal and Minimally Invasive Surgery [facility] [address], [place], [place] [address] [phone] Main / Paging  11/11/2018 4:53 PM

## 2018-11-11 NOTE — H&P (Addendum)
[facility] Admission Note  OMAR ORREGO 1950/12/19  [id number].    Requesting MD: Virgel Manifold  Chief Complaint: Abdominal pain for 2 days, no nausea vomiting or diarrhea Reason for Consult: Acute appendicitis  HPI:  Patient is a 68 year old female who presented to [place] with 2 days of abdominal pain.  It began around the bellybutton and has moved to the right lower quadrant.  Pain is worse when she moves.  No vomiting or diarrhea no urinary complaints or fever.   Pain is intensified.  Worse with coughing and car ride.  Work-up at [facility] shows she is afebrile and vital signs are stable.  Labs obtained on admission are essentially normal.  Glucose is elevated at 117 remainder the CMP is normal.  WBC 6.5, hemoglobin 13.65, hematocrit 42.5, platelets 258,000.  Urinalysis showed many bacteria and 11-20 white cells. CT of the abdomen pelvis with contrast as there is a dilated appendix with enhancing wall and surrounding inflammation.  It is surrounding the appendix suggesting a periappendiceal abscess.  There is no small bowel obstruction.  There is diverticulosis of the colon without diverticulitis.  Also had a minimal hiatal hernia.  This is consistent with acute appendicitis with a possible appendiceal abscess.  She has been transferred from [facility] to [facility] for surgical treatment.    Patient denies any history of inflammatory bowel disease of the has a sister with it.  No IBS.  No sick contacts or travel history.  She had 3 C-sections in the distant past.  No recent surgeries.  Usually moves her bowels about twice a day.  Had a underwhelming colonoscopy in 2014 by Dr. Delfin Edis with [facility] [place].  Just showed mild diverticulosis.  She is not diabetic.  She does not smoke.  No nonsteroidal use aside from baby aspirin every day.  No history of heartburn nor reflux.  ROS: Review of Systems  Gastrointestinal:  Diarrhea:       Constitutional:  No fevers, chills, sweats.  Weight stable Eyes:  No vision changes, No discharge HENT:  No sore throats, nasal drainage Lymph: No neck swelling, No bruising easily Pulmonary:  No cough, productive sputum CV: No orthopnea, PND  Patient walks 20 minutes for about 1 miles without difficulty.  No exertional chest/neck/shoulder/arm pain.  GI:  No personal nor family history of GI/colon cancer, inflammatory bowel disease, irritable bowel syndrome, allergy such as Celiac Sprue, dietary/dairy problems, colitis, ulcers nor gastritis.  No recent sick contacts/gastroenteritis.  No travel outside the country.  No changes in diet.  Renal: No UTIs, No hematuria Genital:  No drainage, bleeding, masses Musculoskeletal: No severe joint pain.  Good ROM major joints Skin:  No sores or lesions Heme/Lymph:  No easy bleeding.  No swollen lymph nodes    Family History  Problem Relation Age of Onset  . Colon cancer Maternal Grandmother 71  . Asthma Other   . Hypertension Other     Past Medical History:  Diagnosis Date  . Hyperlipidemia   . Hypertension   . Obesity   . Sjoegren syndrome     Past Surgical History:  Procedure Laterality Date  . CESAREAN SECTION x 3  1980, 1982, 1986    Social History:  reports that she has never smoked. She has never used smokeless tobacco. She reports current alcohol use of about 2.0 standard drinks of alcohol per week. She reports that she does not use drugs.  Allergies:  No Known Allergies  Prior to Admission medications   Medication Sig Start Date End Date Taking? Authorizing Provider  atorvastatin (LIPITOR) 20 MG tablet Take 20 mg by mouth daily.   Yes [provider]  amLODipine (NORVASC) 10 MG tablet Take 10 mg by mouth daily.    [provider]  Aspirin (BAYER LOW DOSE PO) Take by mouth.    [provider]  Cholecalciferol (VITAMIN D) 2000 UNITS tablet Take 2,000 Units by mouth daily.     [provider]  Estradiol (VAGIFEM) 10 MCG TABS Place 1 tablet (10 mcg total) vaginally 2 (two) times a week. 07/28/12   Ena Dawley, MD  hydrochlorothiazide (HYDRODIURIL) 25 MG tablet Take 25 mg by mouth daily.    [provider]     Blood pressure 117/82, pulse 99, temperature 98.6 F (37 C), temperature source Oral, resp. rate 16, height 5' 5.5" (1.664 m), weight 93 kg, SpO2 99 %. Physical Exam: Physical Exam   General: Pt awake/alert/oriented x4 in no major acute distress Eyes: PERRL, normal EOM. Sclera nonicteric Neuro: CN II-XII intact w/o focal sensory/motor deficits. Lymph: No head/neck/groin lymphadenopathy Psych:  No delerium/psychosis/paranoia HENT: Normocephalic, Mucus membranes moist.  No thrush Neck: Supple, No tracheal deviation Chest: No pain.  Good respiratory excursion. CV:  Pulses intact.  Regular rhythm MS: Normal AROM mjr joints.  No obvious deformity  Abdomen: Soft, Overweight.  Nondistended.  Discomfort in right lower quadrant right over McBurney's point with reproduction of pain with bed shake and cough and percussion.  No Rosvig sign.  Mild psoas sign.  Upper abdomen is Nontender.  No incarcerated hernias.  Ext:  SCDs BLE.  No significant edema.  No cyanosis Skin: No petechiae / purpura   Results for orders placed or performed during the hospital encounter of 11/11/18 (from the past 48 hour(s))  Urinalysis, Routine w reflex microscopic     Status: Abnormal   Collection Time: 11/11/18  8:49 AM  Result Value Ref Range   Color, Urine YELLOW YELLOW   APPearance CLOUDY (A) CLEAR   Specific Gravity, Urine 1.015 1.005 - 1.030   pH 6.0 5.0 - 8.0   Glucose, UA NEGATIVE NEGATIVE mg/dL   Hgb urine dipstick NEGATIVE NEGATIVE   Bilirubin Urine NEGATIVE NEGATIVE   Ketones, ur NEGATIVE NEGATIVE mg/dL   Protein, ur NEGATIVE NEGATIVE mg/dL   Nitrite NEGATIVE NEGATIVE   Leukocytes,Ua MODERATE (A) NEGATIVE    Comment: Performed at [facility], [address] [place], [place] [address]  Urinalysis, Microscopic (reflex)     Status: Abnormal   Collection Time: 11/11/18  8:49 AM  Result Value Ref Range   RBC / HPF 0-5 0 - 5 RBC/hpf   WBC, UA 11-20 0 - 5 WBC/hpf   Bacteria, UA MANY (A) NONE SEEN   Squamous Epithelial / LPF 6-10 0 - 5    Comment: Performed at [facility], [address] [place], [place] [address]  CBC with Differential     Status: None   Collection Time: 11/11/18  9:14 AM  Result Value Ref Range   WBC 6.5 4.0 - 10.5 K/uL   RBC 4.51 3.87 - 5.11 MIL/uL   Hemoglobin 13.5 12.0 - 15.0 g/dL   HCT 42.5 36.0 - 46.0 %   MCV 94.2 80.0 - 100.0 fL   MCH 29.9 26.0 - 34.0 pg   MCHC 31.8 30.0 - 36.0 g/dL   RDW 13.3 11.5 - 15.5 %  Platelets 258 150 - 400 K/uL   nRBC 0.0 0.0 - 0.2 %   Neutrophils Relative % 79 %   Neutro Abs 5.1 1.7 - 7.7 K/uL   Lymphocytes Relative 12 %   Lymphs Abs 0.8 0.7 - 4.0 K/uL   Monocytes Relative 9 %   Monocytes Absolute 0.6 0.1 - 1.0 K/uL   Eosinophils Relative 0 %   Eosinophils Absolute 0.0 0.0 - 0.5 K/uL   Basophils Relative 0 %   Basophils Absolute 0.0 0.0 - 0.1 K/uL   Immature Granulocytes 0 %   Abs Immature Granulocytes 0.02 0.00 - 0.07 K/uL    Comment: Performed at [facility], [address] [place], [place] [address]  Comprehensive metabolic panel     Status: Abnormal   Collection Time: 11/11/18  9:14 AM  Result Value Ref Range   Sodium 139 135 - 145 mmol/L   Potassium 3.9 3.5 - 5.1 mmol/L   Chloride 103 98 - 111 mmol/L   CO2 28 22 - 32 mmol/L   Glucose, Bld 117 (H) 70 - 99 mg/dL   BUN 19 8 - 23 mg/dL   Creatinine, Ser 0.99 0.44 - 1.00 mg/dL   Calcium 9.5 8.9 - 10.3 mg/dL   Total Protein 7.6 6.5 - 8.1 g/dL   Albumin 3.9 3.5 - 5.0 g/dL   AST 18 15 - 41 U/L   ALT 21 0 - 44 U/L   Alkaline Phosphatase 64 38 - 126 U/L   Total Bilirubin 0.4 0.3 - 1.2 mg/dL   GFR calc non Af Amer 59 (L) >60 mL/min   GFR calc Af Amer >60 >60 mL/min   Anion  gap 8 5 - 15    Comment: Performed at [facility], [address] [place], [place] [address]  Lipase, blood     Status: None   Collection Time: 11/11/18  9:14 AM  Result Value Ref Range   Lipase 28 11 - 51 U/L    Comment: Performed at [facility], [address] [place], [place] [address]   Ct Abdomen Pelvis W Contrast  Result Date: 11/11/2018 CLINICAL DATA:  Right abdomen pain for 2 days. EXAM: CT ABDOMEN AND PELVIS WITH CONTRAST TECHNIQUE: Multidetector CT imaging of the abdomen and pelvis was performed using the standard protocol following bolus administration of intravenous contrast. CONTRAST:  118mL OMNIPAQUE IOHEXOL 300 MG/ML  SOLN COMPARISON:  None. FINDINGS: Lower chest: There is a calcified granuloma in the posterior basal segment of the right lower lobe. Peripheral scar versus atelectasis is identified in bilateral lung bases. The heart size is normal. Hepatobiliary: Calcified granulomas are identified in the liver. No focal liver lesion is identified. There is a gallstone in the gallbladder. There is no pericholecystic fluid. The biliary tree is normal. Pancreas: Unremarkable. No pancreatic ductal dilatation or surrounding inflammatory changes. Spleen: Calcified granulomas are identified in the spleen. The spleen is otherwise normal. Adrenals/Urinary Tract: The bilateral adrenal glands are normal. Small cysts are identified in bilateral kidneys. There is no hydronephrosis bilaterally. Partial fluid-filled bladder is normal. Stomach/Bowel: There is dilated appendix with enhancing wall and surrounding inflammation measuring 1.5 cm in diameter. Fluid is noted surrounding the appendix suggesting periappendiceal abscess. There is no small bowel obstruction. There is diverticulosis of colon without diverticulitis. Minimal hiatal hernia is identified. The stomach is otherwise normal. Vascular/Lymphatic: Aortic atherosclerosis. No enlarged abdominal or pelvic lymph nodes.  Reproductive: Uterus and bilateral adnexa are unremarkable. Other: None.  Musculoskeletal: Degenerative joint changes of the spine are identified. IMPRESSION: Findings consistent with acute appendicitis with dilated appendix with enhancing wall and surrounding inflammation measuring 1.5 cm in diameter. Fluid is noted surrounding the appendix suggesting periappendiceal abscess and appendiceal rupture is not excluded. Small cysts in both kidneys. Calcified granulomas in the liver, spleen, and right lung base. These results will be called to the ordering clinician or representative by the Radiologist Assistant, and communication documented in the PACS or zVision Dashboard. Electronically Signed   By: Abelardo Diesel M.D.   On: 11/11/2018 10:38   . sodium chloride 100 mL/hr at 11/11/18 0920  . metronidazole 500 mg (11/11/18 1114)      Assessment/Plan Hypertension Hyperlipidemia  Acute appendicitis with probable abscess.  Despite her symptoms only being 48 hours, she does have some fluid around suspicious for early abscess or at least phlegmon.  I think she would benefit from diagnostic laparoscopy with appendectomy.  The rest of the differential diagnosis seems unlikely.  She agrees.  The anatomy & physiology of the digestive tract was discussed.  The pathophysiology of appendicitis and other appendiceal disorders were discussed.  Natural history risks without surgery was discussed.   I feel the risks of no intervention will lead to serious problems that outweigh the operative risks; therefore, I recommended diagnostic laparoscopy with removal of appendix to remove the pathology.  Laparoscopic & open techniques were discussed.   I noted a good likelihood this will help address the problem.   Risks such as bleeding, infection, abscess, leak, reoperation, injury to other organs, need for repair of tissues / organs, possible ostomy, hernia, heart attack, stroke, death, and other risks were discussed.  Goals  of post-operative recovery were discussed as well.  We will work to minimize complications.  Questions were answered.  The patient expresses understanding & wishes to proceed with surgery.   FEN: IV fluids/p.o. ID: She has had 1 dose of Rocephin/Flagyl >> starting Zosyn given higher suspicion of perforation and more complex presentation DVT: None/we will add SCDs Hypertension.  Hold Norvasc for now and follow with PRN meds.  Hold diuretics for now.  Hyperlipidemia.  Most likely reestablish Lipitor postoperatively. Follow-up: To be determined  Earnstine Regal [facility] Surgery 11/11/2018, 10:58 AM Pager: [phone] Consults: [phone]   Adin Hector, MD, FACS, MASCRS Gastrointestinal and Minimally Invasive Surgery    [address] [place], [place] [address] [phone] Main / Paging [phone] Fax

## 2018-11-12 ENCOUNTER — Encounter (HOSPITAL_COMMUNITY): Payer: Self-pay | Admitting: Surgery

## 2018-11-12 LAB — URINE CULTURE: Culture: <10000 — AB

## 2018-11-12 MED ORDER — HYDROCHLOROTHIAZIDE 25 MG PO TABS
25.0000 mg | ORAL_TABLET | Freq: Every day | ORAL | Status: DC
  Administered 2018-11-12: 25 mg via ORAL
  Filled 2018-11-12: qty 1

## 2018-11-12 MED ORDER — ATORVASTATIN CALCIUM 10 MG PO TABS: 10.0000 mg | ORAL_TABLET | Freq: Every day | ORAL | Status: DC

## 2018-11-12 MED ORDER — AMLODIPINE BESYLATE 10 MG PO TABS
10.0000 mg | ORAL_TABLET | Freq: Every day | ORAL | Status: DC
  Administered 2018-11-12: 10 mg via ORAL
  Filled 2018-11-12: qty 1

## 2018-11-12 MED ORDER — AMOXICILLIN-POT CLAVULANATE 875-125 MG PO TABS: 1.0000 | ORAL_TABLET | Freq: Two times a day (BID) | ORAL | 1 refills | Status: DC

## 2018-11-12 MED ORDER — TRAMADOL HCL 50 MG PO TABS: 50.0000 mg | ORAL_TABLET | Freq: Four times a day (QID) | ORAL | 0 refills | Status: DC | PRN

## 2018-11-12 MED ORDER — TRAMADOL HCL 50 MG PO TABS: 50.0000 mg | ORAL_TABLET | Freq: Four times a day (QID) | ORAL | 0 refills | Status: AC | PRN

## 2018-11-12 MED FILL — traMADol HCL 50 MG TABS: 50 | 3 days supply | Qty: 20 | Fill #0

## 2018-11-12 MED FILL — AMOX-CLAV 875-125 MG TABLET: 875-125 | 5 days supply | Qty: 10 | Fill #0

## 2018-11-12 NOTE — Discharge Summary (Signed)
Physician Discharge Summary    Patient ID: ALEASE Sherman MRN: [id number] DOB/AGE: Dec 01, 1950  68 y.o.  Patient Care Team: Marius Ditch, MD as PCP - General (Internal Medicine) Michael Boston, MD as Consulting Physician (General Surgery) Ena Dawley, MD as Consulting Physician (Obstetrics and Gynecology)  Admit date: 11/11/2018  Discharge date: 11/12/2018  Hospital Stay = 1 days    Discharge Diagnoses:  Principal Problem:   Acute perforated appendicitis Active Problems:   Hyperlipidemia   Essential hypertension   Diverticulosis of colon    Sjogren's syndrome ([place])   Obesity   Acute appendicitis with perforation and localized peritonitis   1 Day Post-Op  11/11/2018  POST-OPERATIVE DIAGNOSIS:   Perforated Appendicitis  SURGERY: APPENDECTOMY LAPAROSCOPIC  SURGEON:    Surgeon(s): Michael Boston, MD  Consults: None  Hospital Course:   The patient underwent the surgery above.  Postoperatively, the patient gradually mobilized and advanced to a solid diet.  Pain and other symptoms were treated aggressively.    By the time of discharge, the patient was walking well the hallways, eating food, having flatus.  Pain was well-controlled on an oral medications.  Based on meeting discharge criteria and continuing to recover, I felt it was safe for the patient to be discharged from the hospital to further recover with close followup. Postoperative recommendations were discussed in detail.  They are written as well.  Discharged Condition: good  Discharge Exam: Blood pressure 115/81, pulse 83, temperature 98.1 F (36.7 C), temperature source Oral, resp. rate 18, height 5' 5.5" (1.664 m), weight 93 kg, SpO2 96 %.  General: Pt awake/alert/oriented x4 in No acute distress Eyes: PERRL, normal EOM.  Sclera clear.  No icterus Neuro: CN II-XII intact w/o focal sensory/motor deficits. Lymph: No head/neck/groin lymphadenopathy Psych:  No delerium/psychosis/paranoia HENT:  Normocephalic, Mucus membranes moist.  No thrush Neck: Supple, No tracheal deviation Chest: No chest wall pain w good excursion CV:  Pulses intact.  Regular rhythm MS: Normal AROM mjr joints.  No obvious deformity Abdomen: Soft.  Nondistended.  Mildly tender at incisions only.  No evidence of peritonitis.  No incarcerated hernias. Ext:  SCDs BLE.  No mjr edema.  No cyanosis Skin: No petechiae / purpura   Disposition:   Follow-up Information    Sur[facility], [facility] Follow up.   Specialty:  General Surgery Why:  Our office will set up follow up with you.  Call the office to help set that up 5/4 Monday Contact information: [place] [address] [phone]           Discharge disposition: 01-Home or Self Care       Discharge Instructions    Call MD for:   Complete by:  As directed    FEVER > 101.5 F  (temperatures < 101.5 F are not significant)   Call MD for:   Complete by:  As directed    FEVER > 101.5 F  (temperatures < 101.5 F are not significant)   Call MD for:  extreme fatigue   Complete by:  As directed    Call MD for:  extreme fatigue   Complete by:  As directed    Call MD for:  persistant dizziness or light-headedness   Complete by:  As directed    Call MD for:  persistant dizziness or light-headedness   Complete by:  As directed    Call MD for:  persistant nausea and vomiting   Complete by:  As directed  Call MD for:  persistant nausea and vomiting   Complete by:  As directed    Call MD for:  redness, tenderness, or signs of infection (pain, swelling, redness, odor or green/yellow discharge around incision site)   Complete by:  As directed    Call MD for:  redness, tenderness, or signs of infection (pain, swelling, redness, odor or green/yellow discharge around incision site)   Complete by:  As directed    Call MD for:  severe uncontrolled pain   Complete by:  As directed    Call MD for:  severe uncontrolled pain    Complete by:  As directed    Diet - low sodium heart healthy   Complete by:  As directed    Start with a bland diet such as soups, liquids, starchy foods, low fat foods, etc. the first few days at home. Gradually advance to a solid, low-fat, high fiber diet by the end of the first week at home.   Add a fiber supplement to your diet (Metamucil, etc) If you feel full, bloated, or constipated, stay on a full liquid or pureed/blenderized diet for a few days until you feel better and are no longer constipated.   Diet - low sodium heart healthy   Complete by:  As directed    Start with a bland diet such as soups, liquids, starchy foods, low fat foods, etc. the first few days at home. Gradually advance to a solid, low-fat, high fiber diet by the end of the first week at home.   Add a fiber supplement to your diet (Metamucil, etc) If you feel full, bloated, or constipated, stay on a full liquid or pureed/blenderized diet for a few days until you feel better and are no longer constipated.   Discharge instructions   Complete by:  As directed    See Discharge Instructions If you are not getting better after two weeks or are noticing you are getting worse, contact our office [phone] for further advice.  We may need to adjust your medications, re-evaluate you in the office, send you to the emergency room, or see what other things we can do to help. The clinic staff is available to answer your questions during regular business hours (8:30am-5pm).  Please don't hesitate to call and ask to speak to one of our nurses for clinical concerns.    A surgeon from [facility] is always on call at the hospitals 24 hours/day If you have a medical emergency, go to the nearest emergency room or call 911.   Discharge instructions   Complete by:  As directed    See Discharge Instructions If you are not getting better after two weeks or are noticing you are getting worse, contact our office [phone]  [phone] for further advice.  We may need to adjust your medications, re-evaluate you in the office, send you to the emergency room, or see what other things we can do to help. The clinic staff is available to answer your questions during regular business hours (8:30am-5pm).  Please don't hesitate to call and ask to speak to one of our nurses for clinical concerns.    A surgeon from [facility] is always on call at the hospitals 24 hours/day If you have a medical emergency, go to the nearest emergency room or call 911.   Discharge wound care:   Complete by:  As directed    It is good for closed incisions and even open wounds to  be washed every day.  Shower every day.  Short baths are fine.  Wash the incisions and wounds clean with soap & water.     You may leave closed incisions open to air if it is dry.   You may cover the incision with clean gauze & replace it after your daily shower for comfort.   If you have skin tapes (Steristrips) or skin glue (Dermabond) on your incision, leave them in place.  They will fall off on their own like a scab.  You may trim any edges that curl up with clean scissors.    If you have skin staples, set up an appointment for them to be removed in the office in 10 days after surgery.  If you have a drain, wash around the skin exit site with soap & water and place a new dressing of gauze or band aid around the skin every day.  Keep the drain site clean & dry.   Discharge wound care:   Complete by:  As directed    It is good for incisions to be washed every day.  Shower every day.  Short baths are fine.  Wash the incisions and wounds clean with soap & water.   Is okay to shower over the dressings -they are waterproof.  Remove the dressings on postoperative day #5, May 6.  You may leave closed incisions open to air if it is dry.   You may cover the incision with clean gauze & replace it after your daily shower for comfort.   Driving Restrictions   Complete  by:  As directed    You may drive when: - you are no longer taking narcotic prescription pain medication - you can comfortably wear a seatbelt - you can safely make sudden turns/stops without pain.   Driving Restrictions   Complete by:  As directed    You may drive when: - you are no longer taking narcotic prescription pain medication - you can comfortably wear a seatbelt - you can safely make sudden turns/stops without pain.   Increase activity slowly   Complete by:  As directed    Start light daily activities --- self-care, walking, climbing stairs- beginning the day after surgery.  Gradually increase activities as tolerated.  Control your pain to be active.  Stop when you are tired.  Ideally, walk several times a day, eventually an hour a day.   Most people are back to most day-to-day activities in a few weeks.  It takes 4-6 weeks to get back to unrestricted, intense activity. If you can walk 30 minutes without difficulty, it is safe to try more intense activity such as jogging, treadmill, bicycling, low-impact aerobics, swimming, etc. Save the most intensive and strenuous activity for last (Usually 4-8 weeks after surgery) such as sit-ups, heavy lifting, contact sports, etc.  Refrain from any intense heavy lifting or straining until you are off narcotics for pain control.  You will have off days, but things should improve week-by-week. DO NOT PUSH THROUGH PAIN.  Let pain be your guide: If it hurts to do something, don't do it.   Increase activity slowly   Complete by:  As directed    Start light daily activities --- self-care, walking, climbing stairs- beginning the day after surgery.  Gradually increase activities as tolerated.  Control your pain to be active.  Stop when you are tired.  Ideally, walk several times a day, eventually an hour a day.   Most people are back  to most day-to-day activities in a few weeks.  It takes 4-6 weeks to get back to unrestricted, intense activity. If you  can walk 30 minutes without difficulty, it is safe to try more intense activity such as jogging, treadmill, bicycling, low-impact aerobics, swimming, etc. Save the most intensive and strenuous activity for last (Usually 4-8 weeks after surgery) such as sit-ups, heavy lifting, contact sports, etc.  Refrain from any intense heavy lifting or straining until you are off narcotics for pain control.  You will have off days, but things should improve week-by-week. DO NOT PUSH THROUGH PAIN.  Let pain be your guide: If it hurts to do something, don't do it.   Lifting restrictions   Complete by:  As directed    If you can walk 30 minutes without difficulty, it is safe to try more intense activity such as jogging, treadmill, bicycling, low-impact aerobics, swimming, etc. Save the most intensive and strenuous activity for last (Usually 4-8 weeks after surgery) such as sit-ups, heavy lifting, contact sports, etc.   Refrain from any intense heavy lifting or straining until you are off narcotics for pain control.  You will have off days, but things should improve week-by-week. DO NOT PUSH THROUGH PAIN.  Let pain be your guide: If it hurts to do something, don't do it.  Pain is your body warning you to avoid that activity for another week until the pain goes down.   Lifting restrictions   Complete by:  As directed    If you can walk 30 minutes without difficulty, it is safe to try more intense activity such as jogging, treadmill, bicycling, low-impact aerobics, swimming, etc. Save the most intensive and strenuous activity for last (Usually 4-8 weeks after surgery) such as sit-ups, heavy lifting, contact sports, etc.   Refrain from any intense heavy lifting or straining until you are off narcotics for pain control.  You will have off days, but things should improve week-by-week. DO NOT PUSH THROUGH PAIN.  Let pain be your guide: If it hurts to do something, don't do it.  Pain is your body warning you to avoid that  activity for another week until the pain goes down.   May shower / Bathe   Complete by:  As directed    May shower / Bathe   Complete by:  As directed    May walk up steps   Complete by:  As directed    May walk up steps   Complete by:  As directed    Sexual Activity Restrictions   Complete by:  As directed    You may have sexual intercourse when it is comfortable. If it hurts to do something, stop.   Sexual Activity Restrictions   Complete by:  As directed    You may have sexual intercourse when it is comfortable. If it hurts to do something, stop.      Allergies as of 11/12/2018   No Known Allergies     Medication List    STOP taking these medications   Estradiol 10 MCG Tabs vaginal tablet Commonly known as:  Vagifem     TAKE these medications   amLODipine 10 MG tablet Commonly known as:  NORVASC Take 10 mg by mouth daily.   amoxicillin-clavulanate 875-125 MG tablet Commonly known as:  Augmentin Take 1 tablet by mouth 2 (two) times daily.   atorvastatin 10 MG tablet Commonly known as:  LIPITOR Take 10 mg by mouth daily.   BAYER LOW DOSE PO  Take by mouth.   hydrochlorothiazide 25 MG tablet Commonly known as:  HYDRODIURIL Take 25 mg by mouth daily.   traMADol 50 MG tablet Commonly known as:  ULTRAM Take 1-2 tablets (50-100 mg total) by mouth every 6 (six) hours as needed for moderate pain or severe pain.            Discharge Care Instructions  (From admission, onward)         Start     Ordered   11/12/18 0000  Discharge wound care:    Comments:  It is good for incisions to be washed every day.  Shower every day.  Short baths are fine.  Wash the incisions and wounds clean with soap & water.   Is okay to shower over the dressings -they are waterproof.  Remove the dressings on postoperative day #5, May 6.  You may leave closed incisions open to air if it is dry.   You may cover the incision with clean gauze & replace it after your daily shower for  comfort.   11/12/18 1057   11/11/18 0000  Discharge wound care:    Comments:  It is good for closed incisions and even open wounds to be washed every day.  Shower every day.  Short baths are fine.  Wash the incisions and wounds clean with soap & water.     You may leave closed incisions open to air if it is dry.   You may cover the incision with clean gauze & replace it after your daily shower for comfort.   If you have skin tapes (Steristrips) or skin glue (Dermabond) on your incision, leave them in place.  They will fall off on their own like a scab.  You may trim any edges that curl up with clean scissors.    If you have skin staples, set up an appointment for them to be removed in the office in 10 days after surgery.  If you have a drain, wash around the skin exit site with soap & water and place a new dressing of gauze or band aid around the skin every day.  Keep the drain site clean & dry.   11/11/18 1653          Significant Diagnostic Studies:  Results for orders placed or performed during the hospital encounter of 11/11/18 (from the past 72 hour(s))  Urinalysis, Routine w reflex microscopic     Status: Abnormal   Collection Time: 11/11/18  8:49 AM  Result Value Ref Range   Color, Urine YELLOW YELLOW   APPearance CLOUDY (A) CLEAR   Specific Gravity, Urine 1.015 1.005 - 1.030   pH 6.0 5.0 - 8.0   Glucose, UA NEGATIVE NEGATIVE mg/dL   Hgb urine dipstick NEGATIVE NEGATIVE   Bilirubin Urine NEGATIVE NEGATIVE   Ketones, ur NEGATIVE NEGATIVE mg/dL   Protein, ur NEGATIVE NEGATIVE mg/dL   Nitrite NEGATIVE NEGATIVE   Leukocytes,Ua MODERATE (A) NEGATIVE    Comment: Performed at [facility], [address] [place], [place] [address]  Urinalysis, Microscopic (reflex)     Status: Abnormal   Collection Time: 11/11/18  8:49 AM  Result Value Ref Range   RBC / HPF 0-5 0 - 5 RBC/hpf   WBC, UA 11-20 0 - 5 WBC/hpf   Bacteria, UA MANY (A) NONE SEEN   Squamous Epithelial / LPF  6-10 0 - 5    Comment: Performed at [facility], [address] [place], [place] [address]  Urine culture     Status: Abnormal   Collection Time: 11/11/18  8:49 AM  Result Value Ref Range   Specimen Description      URINE, RANDOM Performed at [facility], [address] [place], [place] [address]    Special Requests      NONE Performed at [facility], [address] [place], [place] [address]    Culture (A)     <10,000 COLONIES/mL INSIGNIFICANT GROWTH Performed at [facility], [place] [address] [place], [place] [address]    Report Status 11/12/2018 FINAL   CBC with Differential     Status: None   Collection Time: 11/11/18  9:14 AM  Result Value Ref Range   WBC 6.5 4.0 - 10.5 K/uL   RBC 4.51 3.87 - 5.11 MIL/uL   Hemoglobin 13.5 12.0 - 15.0 g/dL   HCT 42.5 36.0 - 46.0 %   MCV 94.2 80.0 - 100.0 fL   MCH 29.9 26.0 - 34.0 pg   MCHC 31.8 30.0 - 36.0 g/dL   RDW 13.3 11.5 - 15.5 %   Platelets 258 150 - 400 K/uL   nRBC 0.0 0.0 - 0.2 %   Neutrophils Relative % 79 %   Neutro Abs 5.1 1.7 - 7.7 K/uL   Lymphocytes Relative 12 %   Lymphs Abs 0.8 0.7 - 4.0 K/uL   Monocytes Relative 9 %   Monocytes Absolute 0.6 0.1 - 1.0 K/uL   Eosinophils Relative 0 %   Eosinophils Absolute 0.0 0.0 - 0.5 K/uL   Basophils Relative 0 %   Basophils Absolute 0.0 0.0 - 0.1 K/uL   Immature Granulocytes 0 %   Abs Immature Granulocytes 0.02 0.00 - 0.07 K/uL    Comment: Performed at [facility], [address] [place], [place] [address]  Comprehensive metabolic panel     Status: Abnormal   Collection Time: 11/11/18  9:14 AM  Result Value Ref Range   Sodium 139 135 - 145 mmol/L   Potassium 3.9 3.5 - 5.1 mmol/L   Chloride 103 98 - 111 mmol/L   CO2 28 22 - 32 mmol/L   Glucose, Bld 117 (H) 70 - 99 mg/dL   BUN 19 8 - 23 mg/dL   Creatinine, Ser 0.99 0.44 - 1.00 mg/dL   Calcium 9.5 8.9 - 10.3 mg/dL   Total Protein 7.6 6.5 - 8.1 g/dL   Albumin 3.9 3.5 -  5.0 g/dL   AST 18 15 - 41 U/L   ALT 21 0 - 44 U/L   Alkaline Phosphatase 64 38 - 126 U/L   Total Bilirubin 0.4 0.3 - 1.2 mg/dL   GFR calc non Af Amer 59 (L) >60 mL/min   GFR calc Af Amer >60 >60 mL/min   Anion gap 8 5 - 15    Comment: Performed at [facility], [address] [place], [place] [address]  Lipase, blood     Status: None   Collection Time: 11/11/18  9:14 AM  Result Value Ref Range   Lipase 28 11 - 51 U/L    Comment: Performed at [facility], [address] [place], [place] [address]    Ct Abdomen Pelvis W Contrast  Result Date: 11/11/2018 CLINICAL DATA:  Right abdomen pain for 2 days. EXAM: CT ABDOMEN AND PELVIS WITH CONTRAST TECHNIQUE: Multidetector CT imaging of the abdomen and pelvis was performed using the standard  protocol following bolus administration of intravenous contrast. CONTRAST:  165mL OMNIPAQUE IOHEXOL 300 MG/ML  SOLN COMPARISON:  None. FINDINGS: Lower chest: There is a calcified granuloma in the posterior basal segment of the right lower lobe. Peripheral scar versus atelectasis is identified in bilateral lung bases. The heart size is normal. Hepatobiliary: Calcified granulomas are identified in the liver. No focal liver lesion is identified. There is a gallstone in the gallbladder. There is no pericholecystic fluid. The biliary tree is normal. Pancreas: Unremarkable. No pancreatic ductal dilatation or surrounding inflammatory changes. Spleen: Calcified granulomas are identified in the spleen. The spleen is otherwise normal. Adrenals/Urinary Tract: The bilateral adrenal glands are normal. Small cysts are identified in bilateral kidneys. There is no hydronephrosis bilaterally. Partial fluid-filled bladder is normal. Stomach/Bowel: There is dilated appendix with enhancing wall and surrounding inflammation measuring 1.5 cm in diameter. Fluid is noted surrounding the appendix suggesting periappendiceal abscess. There is no small bowel obstruction.  There is diverticulosis of colon without diverticulitis. Minimal hiatal hernia is identified. The stomach is otherwise normal. Vascular/Lymphatic: Aortic atherosclerosis. No enlarged abdominal or pelvic lymph nodes. Reproductive: Uterus and bilateral adnexa are unremarkable. Other: None. Musculoskeletal: Degenerative joint changes of the spine are identified. IMPRESSION: Findings consistent with acute appendicitis with dilated appendix with enhancing wall and surrounding inflammation measuring 1.5 cm in diameter. Fluid is noted surrounding the appendix suggesting periappendiceal abscess and appendiceal rupture is not excluded. Small cysts in both kidneys. Calcified granulomas in the liver, spleen, and right lung base. These results will be called to the ordering clinician or representative by the Radiologist Assistant, and communication documented in the PACS or zVision Dashboard. Electronically Signed   By: Abelardo Diesel M.D.   On: 11/11/2018 10:38    Past Medical History:  Diagnosis Date   ANKLE PAIN, LEFT 10/17/2007   Qualifier: Diagnosis of  By: Jerold Coombe     Hyperlipidemia    Hypertension    Obesity    Sjoegren syndrome     Past Surgical History:  Procedure Laterality Date   CESAREAN SECTION  1980, 1982, [place] N/A 11/11/2018   Procedure: APPENDECTOMY LAPAROSCOPIC;  Surgeon: Michael Boston, MD;  Location: [facility];  Service: General;  Laterality: N/A;    Social History   Socioeconomic History   Marital status: Married    Spouse name: Not on file   Number of children: Not on file   Years of education: Not on file   Highest education level: Not on file  Occupational History   Not on file  Social Needs   Financial resource strain: Not on file   Food insecurity:    Worry: Not on file    Inability: Not on file   Transportation needs:    Medical: Not on file    Non-medical: Not on file  Tobacco Use   Smoking status: Never Smoker    Smokeless tobacco: Never Used  Substance and Sexual Activity   Alcohol use: Yes    Alcohol/week: 2.0 standard drinks    Types: 2 Glasses of wine per week   Drug use: No   Sexual activity: Yes    Birth control/protection: Surgical, Post-menopausal    Comment: BTL  Lifestyle   Physical activity:    Days per week: Not on file    Minutes per session: Not on file   Stress: Not on file  Relationships   Social connections:    Talks on phone: Not on file    Gets together:  Not on file    Attends religious service: Not on file    Active member of club or organization: Not on file    Attends meetings of clubs or organizations: Not on file    Relationship status: Not on file   Intimate partner violence:    Fear of current or ex partner: Not on file    Emotionally abused: Not on file    Physically abused: Not on file    Forced sexual activity: Not on file  Other Topics Concern   Not on file  Social History Narrative   Nurse retired 2019 from [employer] History  Problem Relation Age of Onset   Colon cancer Maternal Grandmother 80   Asthma Other    Hypertension Other     Current Facility-Administered Medications  Medication Dose Route Frequency Provider Last Rate Last Dose   acetaminophen (TYLENOL) tablet 1,000 mg  1,000 mg Oral Tor Netters, MD   1,000 mg at 11/12/18 0646   alum & mag hydroxide-simeth (MAALOX/MYLANTA) 200-200-20 MG/5ML suspension 30 mL  30 mL Oral Q6H PRN Michael Boston, MD       amLODipine (NORVASC) tablet 10 mg  10 mg Oral Daily Armandina Gemma, MD   10 mg at 11/12/18 0935   aspirin chewable tablet 81 mg  81 mg Oral Daily Michael Boston, MD   81 mg at 11/12/18 0935   atorvastatin (LIPITOR) tablet 10 mg  10 mg Oral Daily Michael Boston, MD       bisacodyl (DULCOLAX) suppository 10 mg  10 mg Rectal Daily PRN Michael Boston, MD       diphenhydrAMINE (BENADRYL) 12.5 MG/5ML elixir 12.5 mg  12.5 mg Oral Q6H PRN Michael Boston, MD       Or    diphenhydrAMINE (BENADRYL) injection 12.5 mg  12.5 mg Intravenous Q6H PRN Michael Boston, MD       enoxaparin (LOVENOX) injection 40 mg  40 mg Subcutaneous Q24H Michael Boston, MD   40 mg at 11/12/18 0936   feeding supplement (ENSURE SURGERY) liquid 237 mL  237 mL Oral BID BM Michael Boston, MD   237 mL at 11/12/18 0943   gabapentin (NEURONTIN) capsule 300 mg  300 mg Oral BID Michael Boston, MD   300 mg at 11/12/18 0935   hydrALAZINE (APRESOLINE) injection 5-20 mg  5-20 mg Intravenous Q4H PRN Michael Boston, MD       hydrochlorothiazide (HYDRODIURIL) tablet 25 mg  25 mg Oral Daily Armandina Gemma, MD   25 mg at 11/12/18 0935   HYDROmorphone (DILAUDID) injection 0.5-2 mg  0.5-2 mg Intravenous Q2H PRN Michael Boston, MD       lactated ringers infusion 1,000 mL  1,000 mL Intravenous Q8H PRN Ameliana Brashear, Remo Lipps, MD       lip balm (CARMEX) ointment 1 application  1 application Topical BID Michael Boston, MD   1 application at 68/34/19 6222   magic mouthwash  15 mL Oral QID PRN Michael Boston, MD       methocarbamol (ROBAXIN) 1,000 mg in dextrose 5 % 50 mL IVPB  1,000 mg Intravenous Q6H PRN Michael Boston, MD       methocarbamol (ROBAXIN) tablet 750 mg  750 mg Oral Q6H PRN Michael Boston, MD   750 mg at 11/12/18 0935   metoprolol tartrate (LOPRESSOR) injection 5 mg  5 mg Intravenous Q6H PRN Michael Boston, MD       ondansetron (ZOFRAN-ODT) disintegrating tablet 4 mg  4 mg Oral Q6H  PRN Michael Boston, MD       Or   ondansetron [facility]) injection 4 mg  4 mg Intravenous Q6H PRN Michael Boston, MD   4 mg at 11/11/18 1846   oxyCODONE (Oxy IR/ROXICODONE) immediate release tablet 5-10 mg  5-10 mg Oral Q4H PRN Michael Boston, MD       piperacillin-tazobactam (ZOSYN) IVPB 3.375 g  3.375 g Intravenous Tor Netters, MD 12.5 mL/hr at 11/12/18 0649 3.375 g at 11/12/18 1749   polyethylene glycol (MIRALAX / GLYCOLAX) packet 17 g  17 g Oral Daily PRN Michael Boston, MD   17 g at 11/12/18 0935   prochlorperazine  (COMPAZINE) tablet 10 mg  10 mg Oral Q6H PRN Michael Boston, MD       Or   prochlorperazine (COMPAZINE) injection 5-10 mg  5-10 mg Intravenous Q6H PRN Michael Boston, MD       simethicone (MYLICON) chewable tablet 40 mg  40 mg Oral Q6H PRN Michael Boston, MD       zolpidem (AMBIEN) tablet 5 mg  5 mg Oral QHS PRN Michael Boston, MD         No Known Allergies  Signed: Morton Peters, MD, FACS, MASCRS Gastrointestinal and Minimally Invasive Surgery    [address] [place], [place] [address] [phone] Main / Paging [phone] Fax   11/12/2018, 10:57 AM

## 2018-11-12 NOTE — Progress Notes (Signed)
Assessment & Plan: POD#1 - status post lap appendectomy  Advance diet to regular  Encouraged OOB, ambulate  Begin home meds for hypertension  Possibly home later today        Armandina Gemma, MD       [facility]       Office: [phone]   Chief Complaint: Acute appendicitis  Subjective: Patient up in bed, no complaints.  Denies nausea or emesis. Mild pain.  Ambulatory.  Objective: Vital signs in last 24 hours: Temp:  [97.5 F (36.4 C)-99.7 F (37.6 C)] 98.1 F (36.7 C) (05/02 0620) Pulse Rate:  [71-99] 83 (05/02 0620) Resp:  [12-21] 18 (05/02 0620) BP: (103-121)/(63-84) 115/81 (05/02 0620) SpO2:  [93 %-100 %] 96 % (05/02 0620) Weight:  [93 kg] 93 kg (05/01 0831)    Intake/Output from previous day: 05/01 0701 - 05/02 0700 In: 2632.7 [P.O.:120; I.V.:2429.2; IV Piggyback:83.5] Out: 550 [Urine:525; Blood:25] Intake/Output this shift: Total I/O In: -  Out: 300 [Urine:300]  Physical Exam: HEENT - sclerae clear, mucous membranes moist Abdomen - soft without distension; wounds dry and intact Ext - no edema, non-tender Neuro - alert & oriented, no focal deficits  Lab Results:  Recent Labs    11/11/18 0914  WBC 6.5  HGB 13.5  HCT 42.5  PLT 258   BMET Recent Labs    11/11/18 0914  NA 139  K 3.9  CL 103  CO2 28  GLUCOSE 117*  BUN 19  CREATININE 0.99  CALCIUM 9.5   PT/INR No results for input(s): LABPROT, INR in the last 72 hours. Comprehensive Metabolic Panel:    Component Value Date/Time   NA 139 11/11/2018 0914   NA 140 09/14/2007 1415   K 3.9 11/11/2018 0914   K 4.2 09/14/2007 1415   CL 103 11/11/2018 0914   CL 103 09/14/2007 1415   CO2 28 11/11/2018 0914   CO2 30 09/14/2007 1415   BUN 19 11/11/2018 0914   BUN 17 09/14/2007 1415   CREATININE 0.99 11/11/2018 0914   CREATININE 1.0 09/14/2007 1415   GLUCOSE 117 (H) 11/11/2018 0914   GLUCOSE 75 09/14/2007 1415   GLUCOSE 108 (H) 05/19/2006 1044   CALCIUM 9.5  11/11/2018 0914   CALCIUM 9.9 09/14/2007 1415   AST 18 11/11/2018 0914   AST 24 09/14/2007 1415   ALT 21 11/11/2018 0914   ALT 25 09/14/2007 1415   ALKPHOS 64 11/11/2018 0914   BILITOT 0.4 11/11/2018 0914   PROT 7.6 11/11/2018 0914   ALBUMIN 3.9 11/11/2018 0914    Studies/Results: Ct Abdomen Pelvis W Contrast  Result Date: 11/11/2018 CLINICAL DATA:  Right abdomen pain for 2 days. EXAM: CT ABDOMEN AND PELVIS WITH CONTRAST TECHNIQUE: Multidetector CT imaging of the abdomen and pelvis was performed using the standard protocol following bolus administration of intravenous contrast. CONTRAST:  132mL OMNIPAQUE IOHEXOL 300 MG/ML  SOLN COMPARISON:  None. FINDINGS: Lower chest: There is a calcified granuloma in the posterior basal segment of the right lower lobe. Peripheral scar versus atelectasis is identified in bilateral lung bases. The heart size is normal. Hepatobiliary: Calcified granulomas are identified in the liver. No focal liver lesion is identified. There is a gallstone in the gallbladder. There is no pericholecystic fluid. The biliary tree is normal. Pancreas: Unremarkable. No pancreatic ductal dilatation or surrounding inflammatory changes. Spleen: Calcified granulomas are identified in the spleen. The spleen is otherwise normal. Adrenals/Urinary Tract: The bilateral adrenal glands are normal. Small cysts are identified  in bilateral kidneys. There is no hydronephrosis bilaterally. Partial fluid-filled bladder is normal. Stomach/Bowel: There is dilated appendix with enhancing wall and surrounding inflammation measuring 1.5 cm in diameter. Fluid is noted surrounding the appendix suggesting periappendiceal abscess. There is no small bowel obstruction. There is diverticulosis of colon without diverticulitis. Minimal hiatal hernia is identified. The stomach is otherwise normal. Vascular/Lymphatic: Aortic atherosclerosis. No enlarged abdominal or pelvic lymph nodes. Reproductive: Uterus and bilateral  adnexa are unremarkable. Other: None. Musculoskeletal: Degenerative joint changes of the spine are identified. IMPRESSION: Findings consistent with acute appendicitis with dilated appendix with enhancing wall and surrounding inflammation measuring 1.5 cm in diameter. Fluid is noted surrounding the appendix suggesting periappendiceal abscess and appendiceal rupture is not excluded. Small cysts in both kidneys. Calcified granulomas in the liver, spleen, and right lung base. These results will be called to the ordering clinician or representative by the Radiologist Assistant, and communication documented in the PACS or zVision Dashboard. Electronically Signed   By: Abelardo Diesel M.D.   On: 11/11/2018 10:38      Armandina Gemma 11/12/2018  Patient ID: Kristy Sherman, female   DOB: [birth year], 68 y.o.   MRN: [id number]

## 2018-12-12 DIAGNOSIS — C181 Malignant neoplasm of appendix: Secondary | ICD-10-CM | POA: Diagnosis not present

## 2018-12-16 DIAGNOSIS — D373 Neoplasm of uncertain behavior of appendix: Secondary | ICD-10-CM | POA: Diagnosis not present

## 2019-01-09 DIAGNOSIS — D373 Neoplasm of uncertain behavior of appendix: Secondary | ICD-10-CM | POA: Diagnosis not present

## 2019-01-09 DIAGNOSIS — K388 Other specified diseases of appendix: Secondary | ICD-10-CM | POA: Diagnosis not present

## 2019-01-09 DIAGNOSIS — K219 Gastro-esophageal reflux disease without esophagitis: Secondary | ICD-10-CM | POA: Diagnosis not present

## 2019-01-09 DIAGNOSIS — K5730 Diverticulosis of large intestine without perforation or abscess without bleeding: Secondary | ICD-10-CM | POA: Diagnosis not present

## 2019-01-18 DIAGNOSIS — Z6834 Body mass index (BMI) 34.0-34.9, adult: Secondary | ICD-10-CM | POA: Diagnosis not present

## 2019-01-18 DIAGNOSIS — E785 Hyperlipidemia, unspecified: Secondary | ICD-10-CM | POA: Diagnosis not present

## 2019-01-18 DIAGNOSIS — Z1211 Encounter for screening for malignant neoplasm of colon: Secondary | ICD-10-CM | POA: Diagnosis not present

## 2019-01-18 DIAGNOSIS — Z23 Encounter for immunization: Secondary | ICD-10-CM | POA: Diagnosis not present

## 2019-01-18 DIAGNOSIS — I10 Essential (primary) hypertension: Secondary | ICD-10-CM | POA: Diagnosis not present

## 2019-01-18 DIAGNOSIS — Z1239 Encounter for other screening for malignant neoplasm of breast: Secondary | ICD-10-CM | POA: Diagnosis not present

## 2019-01-18 DIAGNOSIS — Z0000 Encounter for general adult medical examination without abnormal findings: Secondary | ICD-10-CM | POA: Diagnosis not present

## 2019-01-18 MED FILL — ATORVASTATIN 10 MG TABLET: 10 | 90 days supply | Qty: 90 | Fill #0

## 2019-01-18 MED FILL — HYDROCHLOROTHIAZIDE 25 MG T: 25 | 90 days supply | Qty: 90 | Fill #0

## 2019-01-18 MED FILL — AMLODIPINE BESYLATE 10 MG T: 10 | 90 days supply | Qty: 90 | Fill #0

## 2019-02-14 ENCOUNTER — Encounter (HOSPITAL_COMMUNITY): Payer: Self-pay

## 2019-02-14 ENCOUNTER — Other Ambulatory Visit: Payer: Self-pay

## 2019-02-14 ENCOUNTER — Emergency Department (HOSPITAL_COMMUNITY): Payer: PPO

## 2019-02-14 ENCOUNTER — Emergency Department (HOSPITAL_COMMUNITY)
Admission: EM | Admit: 2019-02-14 | Discharge: 2019-02-14 | Disposition: A | Discharge status: Home / Self Care | Payer: PPO | Attending: Emergency Medicine | Admitting: Emergency Medicine

## 2019-02-14 DIAGNOSIS — W548XXA Other contact with dog, initial encounter: Secondary | ICD-10-CM | POA: Insufficient documentation

## 2019-02-14 DIAGNOSIS — S79912A Unspecified injury of left hip, initial encounter: Secondary | ICD-10-CM | POA: Diagnosis not present

## 2019-02-14 DIAGNOSIS — M25552 Pain in left hip: Secondary | ICD-10-CM | POA: Diagnosis not present

## 2019-02-14 DIAGNOSIS — Y929 Unspecified place or not applicable: Secondary | ICD-10-CM | POA: Diagnosis not present

## 2019-02-14 DIAGNOSIS — S7002XA Contusion of left hip, initial encounter: Secondary | ICD-10-CM | POA: Diagnosis not present

## 2019-02-14 DIAGNOSIS — Y999 Unspecified external cause status: Secondary | ICD-10-CM | POA: Insufficient documentation

## 2019-02-14 DIAGNOSIS — I10 Essential (primary) hypertension: Secondary | ICD-10-CM | POA: Insufficient documentation

## 2019-02-14 DIAGNOSIS — Z79899 Other long term (current) drug therapy: Secondary | ICD-10-CM | POA: Diagnosis not present

## 2019-02-14 DIAGNOSIS — Y9301 Activity, walking, marching and hiking: Secondary | ICD-10-CM | POA: Insufficient documentation

## 2019-02-14 MED ORDER — ACETAMINOPHEN 500 MG PO TABS
1000.0000 mg | ORAL_TABLET | Freq: Once | ORAL | Status: AC
  Administered 2019-02-14: 1000 mg via ORAL
  Filled 2019-02-14: qty 2

## 2019-02-14 MED ORDER — LIDOCAINE 5 % EX PTCH
1.0000 | MEDICATED_PATCH | CUTANEOUS | Status: DC
  Administered 2019-02-14: 1 via TRANSDERMAL
  Filled 2019-02-14: qty 1

## 2019-02-14 MED ORDER — LIDOCAINE 5 % EX PTCH: 1.0000 | MEDICATED_PATCH | CUTANEOUS | 0 refills | Status: AC

## 2019-02-14 NOTE — Discharge Instructions (Signed)
It was my pleasure taking care of you today!   Tylenol three times a day as needed.   Lidoderm patches as directed.   Ice or heat can help as well.   Call your primary care doctor to schedule a follow up appointment if you are not starting to notice improvement in the next 2-3 days.   Return to ER for new or worsening symptoms, any additional concerns.

## 2019-02-14 NOTE — ED Triage Notes (Addendum)
Pt states she was walking dog and got tangled in leash and fell. Pt injured left hip. Pt ambulatory with limp in triage.

## 2019-02-14 NOTE — ED Provider Notes (Signed)
[facility] DEPT Provider Note   CSN: [id number] Arrival date & time: 02/14/19  1728     History   Chief Complaint Chief Complaint  Patient presents with  . Fall  . Hip Pain    HPI Kristy Sherman is a 68 y.o. female.     The history is provided by the patient and medical records. No language interpreter was used.  Fall Pertinent negatives include no abdominal pain and no headaches.  Hip Pain Pertinent negatives include no abdominal pain and no headaches.   Kristy Sherman is a 68 y.o. female  with a PMH as listed below who presents to the Emergency Department complaining of left hip pain after mechanical fall just prior to arrival.  Patient states that she was walking her dog on a leash when she accidentally got in the middle of the dog and a squirrel.  The dog started running quickly and the leash got tangled between her legs, causing her to fall onto her left hip.  She denies hitting her head.  Loss of consciousness, nausea or vomiting.  Her only complaint today is left hip pain.  No numbness or weakness.  Past Medical History:  Diagnosis Date  . ANKLE PAIN, LEFT 10/17/2007   Qualifier: Diagnosis of  By: Jerold Coombe    . Hyperlipidemia   . Hypertension   . Obesity   . Sjoegren syndrome     Patient Active Problem List   Diagnosis Date Noted  . Dyspareunia in female 11/11/2018  . Atrophic vaginitis 11/11/2018  . Acute perforated appendicitis 11/11/2018  . Acute appendicitis with perforation and localized peritonitis 11/11/2018  . Sjogren's syndrome ([place])   . Obesity   . Diverticulosis of colon  11/10/2012  . Hyperlipidemia 02/24/2007  . Essential hypertension 02/24/2007    Past Surgical History:  Procedure Laterality Date  . [place]  . LAPAROSCOPIC APPENDECTOMY N/A 11/11/2018   Procedure: APPENDECTOMY LAPAROSCOPIC;  Surgeon: Michael Boston, MD;  Location: [facility] ORS;  Service: General;  Laterality: N/A;     OB History    Gravida  3   Para  3   Term      Preterm      AB      Living  3     SAB      TAB      Ectopic      Multiple      Live Births               Home Medications    Prior to Admission medications   Medication Sig Start Date End Date Taking? Authorizing Provider  amLODipine (NORVASC) 10 MG tablet Take 10 mg by mouth daily.    [provider]  amoxicillin-clavulanate (AUGMENTIN) 875-125 MG tablet Take 1 tablet by mouth 2 (two) times daily. 11/12/18   Michael Boston, MD  Aspirin (BAYER LOW DOSE PO) Take by mouth.    [provider]  atorvastatin (LIPITOR) 10 MG tablet Take 10 mg by mouth daily. 10/24/18   [provider]  hydrochlorothiazide (HYDRODIURIL) 25 MG tablet Take 25 mg by mouth daily.    [provider]  lidocaine (LIDODERM) 5 % Place 1 patch onto the skin daily. Remove & Discard patch within 12 hours or as directed by MD 02/14/19   Kaelee Pfeffer, Ozella Almond, PA-C  traMADol (ULTRAM) 50 MG tablet Take 1-2 tablets (50-100 mg total) by mouth every 6 (six) hours as needed for  moderate pain or severe pain. 11/12/18   Michael Boston, MD    Family History Family History  Problem Relation Age of Onset  . Colon cancer Maternal Grandmother 93  . Asthma Other   . Hypertension Other     Social History Social History   Tobacco Use  . Smoking status: Never Smoker  . Smokeless tobacco: Never Used  Substance Use Topics  . Alcohol use: Yes    Alcohol/week: 2.0 standard drinks    Types: 2 Glasses of wine per week  . Drug use: No     Allergies   Patient has no known allergies.   Review of Systems Review of Systems  Gastrointestinal: Negative for abdominal pain, nausea and vomiting.  Musculoskeletal: Positive for arthralgias and myalgias.  Skin: Negative for color change and wound.  Neurological: Negative for dizziness, weakness, numbness and headaches.     Physical Exam Updated Vital Signs BP 135/77 (BP Location: Left  Arm)   Pulse 78   Temp 98.8 F (37.1 C) (Oral)   Resp 14   Wt 96.2 kg   SpO2 100%   BMI 34.74 kg/m   Physical Exam Vitals signs and nursing note reviewed.  Constitutional:      General: She is not in acute distress.    Appearance: She is well-developed.  HENT:     Head: Normocephalic.     Comments: Atraumatic. Neck:     Musculoskeletal: Neck supple.     Comments: No midline or paraspinal tenderness. Cardiovascular:     Rate and Rhythm: Normal rate and regular rhythm.     Heart sounds: Normal heart sounds. No murmur.  Pulmonary:     Effort: Pulmonary effort is normal. No respiratory distress.     Breath sounds: Normal breath sounds.  Abdominal:     General: There is no distension.     Palpations: Abdomen is soft.     Tenderness: There is no abdominal tenderness.  Musculoskeletal:     Comments: Diffuse tenderness to palpation of the left lateral hip without overlying skin changes.  She does have full range of motion of the hip as well.  No tenderness to the knee.  No T/L-spine tenderness.  Bilateral lower extremities with sensation intact and 2+ DP.   Skin:    General: Skin is warm and dry.  Neurological:     Mental Status: She is alert and oriented to person, place, and time.      ED Treatments / Results  Labs (all labs ordered are listed, but only abnormal results are displayed) Labs Reviewed - No data to display  EKG None  Radiology Dg Hip Unilat With Pelvis 2-3 Views Left  Result Date: 02/14/2019 CLINICAL DATA:  Pain status post fall.  Left-sided hip pain EXAM: DG HIP (WITH OR WITHOUT PELVIS) 2-3V LEFT COMPARISON:  None. FINDINGS: There is no evidence of hip fracture or dislocation. There is no evidence of arthropathy or other focal bone abnormality. IMPRESSION: Negative. Electronically Signed   By: Constance Holster M.D.   On: 02/14/2019 19:31    Procedures Procedures (including critical care time)  Medications Ordered in ED Medications  lidocaine  (LIDODERM) 5 % 1 patch (1 patch Transdermal Patch Applied 02/14/19 1856)  acetaminophen (TYLENOL) tablet 1,000 mg (1,000 mg Oral Given 02/14/19 1856)     Initial Impression / Assessment and Plan / ED Course  I have reviewed the triage vital signs and the nursing notes.  Pertinent labs & imaging results that were available during my  care of the patient were reviewed by me and considered in my medical decision making (see chart for details).       Kristy Sherman is a 68 y.o. female who presents to ED for evaluation after mechanical fall.  She did not hit her head.  Not on anticoagulation.  Complaining of left hip pain.  X-ray negative.  She has full range of motion of the hip and is able to ambulate with assistance.  Neurovascularly intact.  Likely contusion.  Feels better with Tylenol and Lidoderm patch in ED.  We will have her follow-up with PCP if she is not improving over the next couple of days.  Reasons to return to the emergency department, follow-up care plan and home care instructions were discussed.  All questions were answered.   Patient discussed with Dr. Darl Householder who agrees with treatment plan.    Final Clinical Impressions(s) / ED Diagnoses   Final diagnoses:  Contusion of left hip, initial encounter    ED Discharge Orders         Ordered    lidocaine (LIDODERM) 5 %  Every 24 hours     02/14/19 1957           Sherlon Nied, Ozella Almond, PA-C 02/14/19 2033    Drenda Freeze, MD 02/14/19 2312

## 2019-04-03 DIAGNOSIS — Z1231 Encounter for screening mammogram for malignant neoplasm of breast: Secondary | ICD-10-CM | POA: Diagnosis not present

## 2019-04-03 DIAGNOSIS — Z01419 Encounter for gynecological examination (general) (routine) without abnormal findings: Secondary | ICD-10-CM | POA: Diagnosis not present

## 2019-04-03 DIAGNOSIS — Z6835 Body mass index (BMI) 35.0-35.9, adult: Secondary | ICD-10-CM | POA: Diagnosis not present

## 2019-04-03 DIAGNOSIS — Z01411 Encounter for gynecological examination (general) (routine) with abnormal findings: Secondary | ICD-10-CM | POA: Diagnosis not present

## 2019-04-03 DIAGNOSIS — N941 Unspecified dyspareunia: Secondary | ICD-10-CM | POA: Diagnosis not present

## 2019-04-24 MED FILL — ATORVASTATIN 10 MG TABLET: 10 | 90 days supply | Qty: 90 | Fill #1

## 2019-04-24 MED FILL — HYDROCHLOROTHIAZIDE 25 MG T: 25 | 90 days supply | Qty: 90 | Fill #1

## 2019-04-24 MED FILL — AMLODIPINE BESYLATE 10 MG T: 10 | 90 days supply | Qty: 90 | Fill #1

## 2019-07-17 MED FILL — AMLODIPINE BESYLATE 10 MG T: 10 | 90 days supply | Qty: 90 | Fill #2

## 2019-07-17 MED FILL — ATORVASTATIN 10 MG TABLET: 10 | 90 days supply | Qty: 90 | Fill #2

## 2019-07-17 MED FILL — HYDROCHLOROTHIAZIDE 25 MG T: 25 | 90 days supply | Qty: 90 | Fill #2

## 2019-07-20 DIAGNOSIS — I10 Essential (primary) hypertension: Secondary | ICD-10-CM | POA: Diagnosis not present

## 2019-07-20 DIAGNOSIS — Z6834 Body mass index (BMI) 34.0-34.9, adult: Secondary | ICD-10-CM | POA: Diagnosis not present

## 2019-07-20 DIAGNOSIS — D373 Neoplasm of uncertain behavior of appendix: Secondary | ICD-10-CM | POA: Diagnosis not present

## 2019-07-20 DIAGNOSIS — M3500 Sicca syndrome, unspecified: Secondary | ICD-10-CM | POA: Diagnosis not present

## 2019-08-11 ENCOUNTER — Ambulatory Visit: Payer: PPO

## 2019-08-19 ENCOUNTER — Ambulatory Visit: Payer: PPO

## 2019-08-24 DIAGNOSIS — R21 Rash and other nonspecific skin eruption: Secondary | ICD-10-CM | POA: Diagnosis not present

## 2019-08-24 DIAGNOSIS — I10 Essential (primary) hypertension: Secondary | ICD-10-CM | POA: Diagnosis not present

## 2019-08-24 DIAGNOSIS — L209 Atopic dermatitis, unspecified: Secondary | ICD-10-CM | POA: Diagnosis not present

## 2019-12-15 DIAGNOSIS — C800 Disseminated malignant neoplasm, unspecified: Secondary | ICD-10-CM | POA: Diagnosis not present

## 2019-12-15 DIAGNOSIS — R188 Other ascites: Secondary | ICD-10-CM | POA: Diagnosis not present

## 2019-12-15 DIAGNOSIS — Z9089 Acquired absence of other organs: Secondary | ICD-10-CM | POA: Diagnosis not present

## 2019-12-15 DIAGNOSIS — D373 Neoplasm of uncertain behavior of appendix: Secondary | ICD-10-CM | POA: Diagnosis not present

## 2019-12-15 DIAGNOSIS — K8020 Calculus of gallbladder without cholecystitis without obstruction: Secondary | ICD-10-CM | POA: Diagnosis not present

## 2020-01-23 DIAGNOSIS — Z1389 Encounter for screening for other disorder: Secondary | ICD-10-CM | POA: Diagnosis not present

## 2020-01-23 DIAGNOSIS — Z1231 Encounter for screening mammogram for malignant neoplasm of breast: Secondary | ICD-10-CM | POA: Diagnosis not present

## 2020-01-23 DIAGNOSIS — E669 Obesity, unspecified: Secondary | ICD-10-CM | POA: Diagnosis not present

## 2020-01-23 DIAGNOSIS — Z1211 Encounter for screening for malignant neoplasm of colon: Secondary | ICD-10-CM | POA: Diagnosis not present

## 2020-01-23 DIAGNOSIS — D373 Neoplasm of uncertain behavior of appendix: Secondary | ICD-10-CM | POA: Diagnosis not present

## 2020-01-23 DIAGNOSIS — I10 Essential (primary) hypertension: Secondary | ICD-10-CM | POA: Diagnosis not present

## 2020-01-23 DIAGNOSIS — E785 Hyperlipidemia, unspecified: Secondary | ICD-10-CM | POA: Diagnosis not present

## 2020-01-23 DIAGNOSIS — C786 Secondary malignant neoplasm of retroperitoneum and peritoneum: Secondary | ICD-10-CM | POA: Diagnosis not present

## 2020-01-23 DIAGNOSIS — Z0000 Encounter for general adult medical examination without abnormal findings: Secondary | ICD-10-CM | POA: Diagnosis not present

## 2020-02-12 DIAGNOSIS — K219 Gastro-esophageal reflux disease without esophagitis: Secondary | ICD-10-CM | POA: Diagnosis not present

## 2020-02-12 DIAGNOSIS — E785 Hyperlipidemia, unspecified: Secondary | ICD-10-CM | POA: Diagnosis not present

## 2020-02-12 DIAGNOSIS — I444 Left anterior fascicular block: Secondary | ICD-10-CM | POA: Diagnosis not present

## 2020-02-12 DIAGNOSIS — Z01812 Encounter for preprocedural laboratory examination: Secondary | ICD-10-CM | POA: Diagnosis not present

## 2020-02-12 DIAGNOSIS — M3500 Sicca syndrome, unspecified: Secondary | ICD-10-CM | POA: Diagnosis not present

## 2020-02-12 DIAGNOSIS — D490 Neoplasm of unspecified behavior of digestive system: Secondary | ICD-10-CM | POA: Diagnosis not present

## 2020-02-12 DIAGNOSIS — Z6834 Body mass index (BMI) 34.0-34.9, adult: Secondary | ICD-10-CM | POA: Diagnosis not present

## 2020-02-12 DIAGNOSIS — I10 Essential (primary) hypertension: Secondary | ICD-10-CM | POA: Diagnosis not present

## 2020-02-12 DIAGNOSIS — Z01810 Encounter for preprocedural cardiovascular examination: Secondary | ICD-10-CM | POA: Diagnosis not present

## 2020-02-12 DIAGNOSIS — E669 Obesity, unspecified: Secondary | ICD-10-CM | POA: Diagnosis not present

## 2020-02-19 DIAGNOSIS — C181 Malignant neoplasm of appendix: Secondary | ICD-10-CM | POA: Diagnosis not present

## 2020-02-19 DIAGNOSIS — I10 Essential (primary) hypertension: Secondary | ICD-10-CM | POA: Diagnosis not present

## 2020-02-19 DIAGNOSIS — Z6834 Body mass index (BMI) 34.0-34.9, adult: Secondary | ICD-10-CM | POA: Diagnosis not present

## 2020-02-19 DIAGNOSIS — Z79899 Other long term (current) drug therapy: Secondary | ICD-10-CM | POA: Diagnosis not present

## 2020-02-19 DIAGNOSIS — S36112A Contusion of liver, initial encounter: Secondary | ICD-10-CM | POA: Diagnosis not present

## 2020-02-19 DIAGNOSIS — Z8249 Family history of ischemic heart disease and other diseases of the circulatory system: Secondary | ICD-10-CM | POA: Diagnosis not present

## 2020-02-19 DIAGNOSIS — Z9911 Dependence on respirator [ventilator] status: Secondary | ICD-10-CM | POA: Diagnosis not present

## 2020-02-19 DIAGNOSIS — Z87891 Personal history of nicotine dependence: Secondary | ICD-10-CM | POA: Diagnosis not present

## 2020-02-19 DIAGNOSIS — E785 Hyperlipidemia, unspecified: Secondary | ICD-10-CM | POA: Diagnosis not present

## 2020-02-19 DIAGNOSIS — Z781 Physical restraint status: Secondary | ICD-10-CM | POA: Diagnosis not present

## 2020-02-19 DIAGNOSIS — C786 Secondary malignant neoplasm of retroperitoneum and peritoneum: Secondary | ICD-10-CM | POA: Diagnosis not present

## 2020-02-19 DIAGNOSIS — Z823 Family history of stroke: Secondary | ICD-10-CM | POA: Diagnosis not present

## 2020-02-19 DIAGNOSIS — I9589 Other hypotension: Secondary | ICD-10-CM | POA: Diagnosis not present

## 2020-02-19 DIAGNOSIS — Z7982 Long term (current) use of aspirin: Secondary | ICD-10-CM | POA: Diagnosis not present

## 2020-02-19 DIAGNOSIS — D373 Neoplasm of uncertain behavior of appendix: Secondary | ICD-10-CM | POA: Diagnosis not present

## 2020-02-19 DIAGNOSIS — I9581 Postprocedural hypotension: Secondary | ICD-10-CM | POA: Diagnosis not present

## 2020-02-19 DIAGNOSIS — E8770 Fluid overload, unspecified: Secondary | ICD-10-CM | POA: Diagnosis not present

## 2020-02-19 DIAGNOSIS — J189 Pneumonia, unspecified organism: Secondary | ICD-10-CM | POA: Diagnosis not present

## 2020-02-19 DIAGNOSIS — Z4659 Encounter for fitting and adjustment of other gastrointestinal appliance and device: Secondary | ICD-10-CM | POA: Diagnosis not present

## 2020-02-19 DIAGNOSIS — R188 Other ascites: Secondary | ICD-10-CM | POA: Diagnosis not present

## 2020-02-19 DIAGNOSIS — Z833 Family history of diabetes mellitus: Secondary | ICD-10-CM | POA: Diagnosis not present

## 2020-02-19 DIAGNOSIS — D62 Acute posthemorrhagic anemia: Secondary | ICD-10-CM | POA: Diagnosis not present

## 2020-02-19 DIAGNOSIS — E669 Obesity, unspecified: Secondary | ICD-10-CM | POA: Diagnosis not present

## 2020-02-19 DIAGNOSIS — R000 Tachycardia, unspecified: Secondary | ICD-10-CM | POA: Diagnosis not present

## 2020-02-19 DIAGNOSIS — K219 Gastro-esophageal reflux disease without esophagitis: Secondary | ICD-10-CM | POA: Diagnosis not present

## 2020-03-01 DIAGNOSIS — C181 Malignant neoplasm of appendix: Secondary | ICD-10-CM | POA: Diagnosis not present

## 2020-03-03 DIAGNOSIS — Z9049 Acquired absence of other specified parts of digestive tract: Secondary | ICD-10-CM | POA: Diagnosis not present

## 2020-03-03 DIAGNOSIS — Z483 Aftercare following surgery for neoplasm: Secondary | ICD-10-CM | POA: Diagnosis not present

## 2020-03-03 DIAGNOSIS — C786 Secondary malignant neoplasm of retroperitoneum and peritoneum: Secondary | ICD-10-CM | POA: Diagnosis not present

## 2020-03-03 DIAGNOSIS — Z9981 Dependence on supplemental oxygen: Secondary | ICD-10-CM | POA: Diagnosis not present

## 2020-03-03 DIAGNOSIS — C181 Malignant neoplasm of appendix: Secondary | ICD-10-CM | POA: Diagnosis not present

## 2020-03-03 DIAGNOSIS — Z9181 History of falling: Secondary | ICD-10-CM | POA: Diagnosis not present

## 2020-03-03 DIAGNOSIS — Z90710 Acquired absence of both cervix and uterus: Secondary | ICD-10-CM | POA: Diagnosis not present

## 2020-03-03 DIAGNOSIS — Z7982 Long term (current) use of aspirin: Secondary | ICD-10-CM | POA: Diagnosis not present

## 2020-03-03 DIAGNOSIS — Z9089 Acquired absence of other organs: Secondary | ICD-10-CM | POA: Diagnosis not present

## 2020-03-03 DIAGNOSIS — I10 Essential (primary) hypertension: Secondary | ICD-10-CM | POA: Diagnosis not present

## 2020-03-03 DIAGNOSIS — Z79891 Long term (current) use of opiate analgesic: Secondary | ICD-10-CM | POA: Diagnosis not present

## 2020-03-08 DIAGNOSIS — E669 Obesity, unspecified: Secondary | ICD-10-CM | POA: Diagnosis not present

## 2020-03-08 DIAGNOSIS — E785 Hyperlipidemia, unspecified: Secondary | ICD-10-CM | POA: Diagnosis not present

## 2020-03-08 DIAGNOSIS — C786 Secondary malignant neoplasm of retroperitoneum and peritoneum: Secondary | ICD-10-CM | POA: Diagnosis not present

## 2020-03-08 DIAGNOSIS — I10 Essential (primary) hypertension: Secondary | ICD-10-CM | POA: Diagnosis not present

## 2020-03-13 DIAGNOSIS — C786 Secondary malignant neoplasm of retroperitoneum and peritoneum: Secondary | ICD-10-CM | POA: Diagnosis not present

## 2020-03-13 DIAGNOSIS — C181 Malignant neoplasm of appendix: Secondary | ICD-10-CM | POA: Diagnosis not present

## 2020-03-13 DIAGNOSIS — Z7982 Long term (current) use of aspirin: Secondary | ICD-10-CM | POA: Diagnosis not present

## 2020-03-13 DIAGNOSIS — Z79891 Long term (current) use of opiate analgesic: Secondary | ICD-10-CM | POA: Diagnosis not present

## 2020-03-13 DIAGNOSIS — Z90710 Acquired absence of both cervix and uterus: Secondary | ICD-10-CM | POA: Diagnosis not present

## 2020-03-13 DIAGNOSIS — Z9089 Acquired absence of other organs: Secondary | ICD-10-CM | POA: Diagnosis not present

## 2020-03-13 DIAGNOSIS — Z9049 Acquired absence of other specified parts of digestive tract: Secondary | ICD-10-CM | POA: Diagnosis not present

## 2020-03-13 DIAGNOSIS — Z9181 History of falling: Secondary | ICD-10-CM | POA: Diagnosis not present

## 2020-03-13 DIAGNOSIS — Z483 Aftercare following surgery for neoplasm: Secondary | ICD-10-CM | POA: Diagnosis not present

## 2020-03-13 DIAGNOSIS — I10 Essential (primary) hypertension: Secondary | ICD-10-CM | POA: Diagnosis not present

## 2020-03-13 DIAGNOSIS — Z9981 Dependence on supplemental oxygen: Secondary | ICD-10-CM | POA: Diagnosis not present

## 2020-03-15 DIAGNOSIS — Z87891 Personal history of nicotine dependence: Secondary | ICD-10-CM | POA: Diagnosis not present

## 2020-03-15 DIAGNOSIS — R188 Other ascites: Secondary | ICD-10-CM | POA: Diagnosis not present

## 2020-03-15 DIAGNOSIS — Z23 Encounter for immunization: Secondary | ICD-10-CM | POA: Diagnosis not present

## 2020-03-15 DIAGNOSIS — K668 Other specified disorders of peritoneum: Secondary | ICD-10-CM | POA: Diagnosis not present

## 2020-03-15 DIAGNOSIS — I82890 Acute embolism and thrombosis of other specified veins: Secondary | ICD-10-CM | POA: Diagnosis not present

## 2020-03-15 DIAGNOSIS — C786 Secondary malignant neoplasm of retroperitoneum and peritoneum: Secondary | ICD-10-CM | POA: Diagnosis not present

## 2020-03-15 DIAGNOSIS — K7689 Other specified diseases of liver: Secondary | ICD-10-CM | POA: Diagnosis not present

## 2020-03-15 DIAGNOSIS — C181 Malignant neoplasm of appendix: Secondary | ICD-10-CM | POA: Diagnosis not present

## 2020-03-15 DIAGNOSIS — Z483 Aftercare following surgery for neoplasm: Secondary | ICD-10-CM | POA: Diagnosis not present

## 2020-03-15 DIAGNOSIS — Z9221 Personal history of antineoplastic chemotherapy: Secondary | ICD-10-CM | POA: Diagnosis not present

## 2020-03-15 DIAGNOSIS — J90 Pleural effusion, not elsewhere classified: Secondary | ICD-10-CM | POA: Diagnosis not present

## 2020-04-01 DIAGNOSIS — C181 Malignant neoplasm of appendix: Secondary | ICD-10-CM | POA: Diagnosis not present

## 2020-04-08 DIAGNOSIS — D649 Anemia, unspecified: Secondary | ICD-10-CM | POA: Diagnosis not present

## 2020-04-08 DIAGNOSIS — R140 Abdominal distension (gaseous): Secondary | ICD-10-CM | POA: Diagnosis not present

## 2020-04-08 DIAGNOSIS — R634 Abnormal weight loss: Secondary | ICD-10-CM | POA: Diagnosis not present

## 2020-04-08 DIAGNOSIS — R300 Dysuria: Secondary | ICD-10-CM | POA: Diagnosis not present

## 2020-04-08 DIAGNOSIS — C786 Secondary malignant neoplasm of retroperitoneum and peritoneum: Secondary | ICD-10-CM | POA: Diagnosis not present

## 2020-04-08 DIAGNOSIS — E538 Deficiency of other specified B group vitamins: Secondary | ICD-10-CM | POA: Diagnosis not present

## 2020-04-08 DIAGNOSIS — D373 Neoplasm of uncertain behavior of appendix: Secondary | ICD-10-CM | POA: Diagnosis not present

## 2020-04-12 DIAGNOSIS — Z9089 Acquired absence of other organs: Secondary | ICD-10-CM | POA: Diagnosis not present

## 2020-04-12 DIAGNOSIS — C181 Malignant neoplasm of appendix: Secondary | ICD-10-CM | POA: Diagnosis not present

## 2020-04-12 DIAGNOSIS — Z9049 Acquired absence of other specified parts of digestive tract: Secondary | ICD-10-CM | POA: Diagnosis not present

## 2020-04-12 DIAGNOSIS — Z483 Aftercare following surgery for neoplasm: Secondary | ICD-10-CM | POA: Diagnosis not present

## 2020-04-12 DIAGNOSIS — Z79891 Long term (current) use of opiate analgesic: Secondary | ICD-10-CM | POA: Diagnosis not present

## 2020-04-12 DIAGNOSIS — C786 Secondary malignant neoplasm of retroperitoneum and peritoneum: Secondary | ICD-10-CM | POA: Diagnosis not present

## 2020-04-12 DIAGNOSIS — Z90710 Acquired absence of both cervix and uterus: Secondary | ICD-10-CM | POA: Diagnosis not present

## 2020-04-12 DIAGNOSIS — Z9181 History of falling: Secondary | ICD-10-CM | POA: Diagnosis not present

## 2020-04-12 DIAGNOSIS — Z7982 Long term (current) use of aspirin: Secondary | ICD-10-CM | POA: Diagnosis not present

## 2020-04-12 DIAGNOSIS — Z9981 Dependence on supplemental oxygen: Secondary | ICD-10-CM | POA: Diagnosis not present

## 2020-04-12 DIAGNOSIS — I10 Essential (primary) hypertension: Secondary | ICD-10-CM | POA: Diagnosis not present

## 2020-05-01 DIAGNOSIS — C181 Malignant neoplasm of appendix: Secondary | ICD-10-CM | POA: Diagnosis not present

## 2020-05-08 DIAGNOSIS — C786 Secondary malignant neoplasm of retroperitoneum and peritoneum: Secondary | ICD-10-CM | POA: Diagnosis not present

## 2020-05-08 DIAGNOSIS — I10 Essential (primary) hypertension: Secondary | ICD-10-CM | POA: Diagnosis not present

## 2020-05-08 DIAGNOSIS — R0600 Dyspnea, unspecified: Secondary | ICD-10-CM | POA: Diagnosis not present

## 2020-05-09 DIAGNOSIS — Z23 Encounter for immunization: Secondary | ICD-10-CM | POA: Diagnosis not present

## 2020-05-15 ENCOUNTER — Ambulatory Visit: Payer: PPO | Attending: Internal Medicine | Admitting: Physical Therapy

## 2020-05-15 ENCOUNTER — Other Ambulatory Visit: Payer: Self-pay

## 2020-05-15 ENCOUNTER — Encounter: Payer: Self-pay | Admitting: Physical Therapy

## 2020-05-15 DIAGNOSIS — M6281 Muscle weakness (generalized): Secondary | ICD-10-CM | POA: Insufficient documentation

## 2020-05-15 DIAGNOSIS — R262 Difficulty in walking, not elsewhere classified: Secondary | ICD-10-CM | POA: Diagnosis not present

## 2020-05-15 NOTE — Therapy (Signed)
[place] [place], [place], [address] Phone: [phone]   Fax:  [phone]  Physical Therapy Evaluation  Patient Details  Name: Kristy Sherman MRN: [id number] Date of Birth: 16-Nov-1950 Referring Provider (PT): Rupashree   Encounter Date: 05/15/2020   PT End of Session - 05/15/20 1141    Visit Number 1    Date for PT Re-Evaluation 07/15/20    PT Start Time 1054    PT Stop Time 1133    PT Time Calculation (min) 39 min    Activity Tolerance Patient tolerated treatment well    Behavior During Therapy [facility] for tasks assessed/performed           Past Medical History:  Diagnosis Date  . ANKLE PAIN, LEFT 10/17/2007   Qualifier: Diagnosis of  By: Jerold Coombe    . Hyperlipidemia   . Hypertension   . Obesity   . Sjoegren syndrome     Past Surgical History:  Procedure Laterality Date  . [place]  . LAPAROSCOPIC APPENDECTOMY N/A 11/11/2018   Procedure: APPENDECTOMY LAPAROSCOPIC;  Surgeon: Michael Boston, MD;  Location: [facility];  Service: General;  Laterality: N/A;    There were no vitals filed for this visit.    Subjective Assessment - 05/15/20 1059    Subjective Pt had appendicitis in 2020 and a malignant tumor was found during surgery. Pt had surgery 02/19/2020 for intraperitoneal chemotherapy; was told that this would likely be only round of chemo needed; has follow up CT scan in 09/2020. Pt is experiencing some abdominal tightness following surgery and reports trouble standing upright d/t feelings of "stretching". States she finds herself slouching over a lot. Pt reports discharge instructions states no lifting >10#.    Pertinent History malignant neoplasm of appendix    Limitations Lifting;Standing;Walking    How long can you stand comfortably? 4-5 minutes    How long can you walk comfortably? up to 1/4th mile    Diagnostic tests xrays/CT scan    Patient Stated Goals increase  strength, improve endurance, get back to [facility]    Currently in Pain? No/denies    Pain Score 0-No pain    Pain Location Abdomen    Pain Descriptors / Indicators Tightness    Pain Onset More than a month ago    Pain Frequency Intermittent    Aggravating Factors  standing upright    Pain Relieving Factors rest, bending over              [facility] PT Assessment - 05/15/20 0001      Assessment   Medical Diagnosis Deconditioning/LE weakness    Referring Provider (PT) Rupashree    Onset Date/Surgical Date 02/19/20    Next MD Visit --   09/2020   Prior Therapy [facility] PT after surgery      Precautions   Precautions None      Restrictions   Other Position/Activity Restrictions no lifting >10#      Balance Screen   Has the patient fallen in the past 6 months No    Has the patient had a decrease in activity level because of a fear of falling?  No    Is the patient reluctant to leave their home because of a fear of falling?  No      Home Environment   Additional Comments stairs at home; reports some difficulty with stairs d/t endurance      Prior Function  Level of Independence Independent    Vocation Retired    Leisure walking, [employer], [employer]      Functional Tests   Functional tests Sit to [employer] to Stand   Comments some difficulty with eccentric control      Posture/Postural Control   Posture/Postural Control Postural limitations    Postural Limitations Rounded Shoulders;Forward head    Posture Comments some thoracic kyphosis with seated posture d/t abdominal tightness with upright posture      ROM / Strength   AROM / PROM / Strength AROM;Strength      AROM   Overall AROM Comments lumbar extension ROM 25% limited d/t abdominal tightness; lumbar flexion ROM limited 25%, otherwise lumbar ROM WFL      Strength   Overall Strength Comments BLE 4/5 except B hip abd/ext 4-/5      Flexibility   Soft Tissue Assessment /Muscle Length yes    Hamstrings mild tightness     Piriformis mild tightness      Transfers   Five time sit to stand comments  <15 sec; some eccentric weakness with increasing reps      Ambulation/Gait   Ambulation/Gait Yes    Ambulation/Gait Assistance 7: Independent    Ambulation Distance (Feet) 50 Feet    Assistive device None    Gait Pattern Step-through pattern;Narrow base of support;Trendelenburg    Stairs Yes    Stairs Assistance 7: Independent    Stair Management Technique No rails    Number of Stairs 4    Height of Stairs 6    Gait Comments some difficulty with stairs with no HR; hip weakness with gait, narrow BOS                      Objective measurements completed on examination: See above findings.       [facility] PT Treatment/Exercise - 05/15/20 0001      Exercises   Exercises Lumbar      Lumbar Exercises: Seated   Sit to Stand 10 reps    Sit to Stand Limitations no UE      Lumbar Exercises: Supine   Pelvic Tilt 10 reps;5 seconds    Bridge Compliant;10 reps;3 seconds    Other Supine Lumbar Exercises marches with PPT                  PT Education - 05/15/20 1149    Education Details Pt educated on POC and HEP    Person(s) Educated Patient    Methods Explanation;Demonstration;Handout    Comprehension Verbalized understanding;Returned demonstration            PT Short Term Goals - 05/15/20 1159      PT SHORT TERM GOAL #1   Title Pt will be I with initial HEP    Time 2    Period Weeks    Status New    Target Date 05/29/20             PT Long Term Goals - 05/15/20 1200      PT LONG TERM GOAL #1   Title Pt will be I with advanced HEP    Time 12    Period Weeks    Status New    Target Date 08/07/20      PT LONG TERM GOAL #2   Title Pt will demo BLE strength 4+/5    Time 12    Period Weeks  Status New    Target Date 08/07/20      PT LONG TERM GOAL #3   Title Pt will report able to stand in kitchen to cook meal or >30 min standing    Baseline 5 min     Time 12    Period Weeks    Status New    Target Date 08/07/20      PT LONG TERM GOAL #4   Title Pt will demo able to walk 2 laps around PT building without rest breaks or increase in pain    Time 12    Period Weeks    Status New    Target Date 08/07/20                  Plan - 05/15/20 1150    Clinical Impression Statement Pt presents to clinic with general deconditioning and LE weakness following surgery on 02/19/2020. Pt was diagnosed with malignant neoplasm of the appendix in 2020; 2021 procedure included thermodynamic intraperitoneal chemotherapy along with excision of multiple abdominal structures which can be viewed in surgical note. Pt has been receiving HH PT since d/c from hospital; is currently walking up to 1/4th mile and working up to 1/2 mile. Still has difficulty standing for long periods of time. Demos significant core weakness; reports no pain but has abdominal "tightness" with upright posture. Follow up with MD 09/2020; no lifting >10#. Pt would benefit from general LE/UE strengthening, aerobic conditioning, functional strengthening, and core strengthening ex's.    Personal Factors and Comorbidities Comorbidity 2    Comorbidities malignant neoplasm of appendix (2020), HTN    Examination-Activity Limitations Lift;Stand;Stairs;Locomotion Level    Stability/Clinical Decision Making Evolving/Moderate complexity    Clinical Decision Making Moderate    Rehab Potential Good    PT Frequency 2x / week    PT Duration 12 weeks    PT Treatment/Interventions ADLs/Self Care Home Management;Neuromuscular re-education;Therapeutic exercise;Therapeutic activities;Functional mobility training;Stair training;Gait training;Patient/family education;Manual techniques;Passive range of motion;Scar mobilization    PT Next Visit Plan core stab, general aerobic conditioning/strengthening    PT Home Exercise Plan STS, standing hip abd/ext, supine PPT, bridges, supine marching with PPT     Consulted and Agree with Plan of Care Patient           Patient will benefit from skilled therapeutic intervention in order to improve the following deficits and impairments:  Abnormal gait, Difficulty walking, Decreased endurance, Decreased activity tolerance, Impaired flexibility, Decreased strength, Decreased mobility, Postural dysfunction  Visit Diagnosis: Muscle weakness (generalized)  Difficulty in walking, not elsewhere classified     Problem List Patient Active Problem List   Diagnosis Date Noted  . Dyspareunia in female 11/11/2018  . Atrophic vaginitis 11/11/2018  . Acute perforated appendicitis 11/11/2018  . Acute appendicitis with perforation and localized peritonitis 11/11/2018  . Sjogren's syndrome ([place])   . Obesity   . Diverticulosis of colon  11/10/2012  . Hyperlipidemia 02/24/2007  . Essential hypertension 02/24/2007   Amador Cunas, PT, DPT Donald Prose Luis Sami 05/15/2020, 12:02 PM  [address]. [place], [place], [address] Phone: [phone]   Fax:  [phone]  Name: Kristy Sherman MRN: [id number] Date of Birth: [birth year]

## 2020-05-15 NOTE — Patient Instructions (Signed)
Access Code: [email or web address] URL: [email or web address] Date: 05/15/2020 Prepared by: Amador Cunas  Exercises Supine Posterior Pelvic Tilt - 1 x daily - 7 x weekly - 3 sets - 10 reps - 3 sec hold Supine Bridge - 1 x daily - 7 x weekly - 3 sets - 10 reps Supine 90/90 Alternating Heel Touches with Posterior Pelvic Tilt - 1 x daily - 7 x weekly - 3 sets - 10 reps Standing Hip Abduction with Resistance at Ankles and Counter Support - 1 x daily - 7 x weekly - 3 sets - 10 reps Standing Hip Extension Kicks - 1 x daily - 7 x weekly - 3 sets - 10 reps Sit to Stand without Arm Support - 1 x daily - 7 x weekly - 3 sets - 10 reps

## 2020-05-21 ENCOUNTER — Ambulatory Visit: Payer: PPO | Admitting: Physical Therapy

## 2020-05-21 ENCOUNTER — Encounter: Payer: Self-pay | Admitting: Physical Therapy

## 2020-05-21 ENCOUNTER — Other Ambulatory Visit: Payer: Self-pay

## 2020-05-21 DIAGNOSIS — M6281 Muscle weakness (generalized): Secondary | ICD-10-CM

## 2020-05-21 DIAGNOSIS — R262 Difficulty in walking, not elsewhere classified: Secondary | ICD-10-CM

## 2020-05-21 NOTE — Therapy (Signed)
[place]. [place], [place], [address] Phone: [phone]   Fax:  [phone]  Physical Therapy Treatment  Patient Details  Name: KAROLYNN INFANTINO MRN: [id number] Date of Birth: 1951/01/18 Referring Provider (PT): Rupashree   Encounter Date: 05/21/2020   PT End of Session - 05/21/20 1137    Visit Number 2    Date for PT Re-Evaluation 07/15/20    PT Start Time 1100    PT Stop Time 1140    PT Time Calculation (min) 40 min    Activity Tolerance Patient tolerated treatment well    Behavior During Therapy [facility] for tasks assessed/performed           Past Medical History:  Diagnosis Date  . ANKLE PAIN, LEFT 10/17/2007   Qualifier: Diagnosis of  By: Jerold Coombe    . Hyperlipidemia   . Hypertension   . Obesity   . Sjoegren syndrome     Past Surgical History:  Procedure Laterality Date  . [place]  . LAPAROSCOPIC APPENDECTOMY N/A 11/11/2018   Procedure: APPENDECTOMY LAPAROSCOPIC;  Surgeon: Michael Boston, MD;  Location: [facility];  Service: General;  Laterality: N/A;    There were no vitals filed for this visit.   Subjective Assessment - 05/21/20 1100    Subjective No pain today. Is experiencing some stomach bloating that is making her uncomfotable. Reports not doing her HEP that the PT gave her. She commented that she would like to get back to working out at [employer].    Pain Score 0-No pain                             [facility] Adult PT Treatment/Exercise - 05/21/20 0001      Lumbar Exercises: Aerobic   Recumbent Bike L1.5 42mins      Lumbar Exercises: Standing   Scapular Retraction 20 reps;Theraband   red   Shoulder ADduction 20 reps;Theraband   red     Lumbar Exercises: Seated   Sit to Stand 10 reps    Other Seated Lumbar Exercises Mini ball chest press #5 x10      Lumbar Exercises: Supine   Pelvic Tilt 10 reps;5 seconds    Bridge with Ball Squeeze 10 reps;3  seconds    Other Supine Lumbar Exercises physioball abs x10, miniball rot x10, Supine clams 10x3" R theraband     Other Supine Lumbar Exercises PPT + alt marches x10                    PT Short Term Goals - 05/15/20 1159      PT SHORT TERM GOAL #1   Title Pt will be I with initial HEP    Time 2    Period Weeks    Status New    Target Date 05/29/20             PT Long Term Goals - 05/15/20 1200      PT LONG TERM GOAL #1   Title Pt will be I with advanced HEP    Time 12    Period Weeks    Status New    Target Date 08/07/20      PT LONG TERM GOAL #2   Title Pt will demo BLE strength 4+/5    Time 12    Period Weeks    Status New    Target  Date 08/07/20      PT LONG TERM GOAL #3   Title Pt will report able to stand in kitchen to cook meal or >30 min standing    Baseline 5 min    Time 12    Period Weeks    Status New    Target Date 08/07/20      PT LONG TERM GOAL #4   Title Pt will demo able to walk 2 laps around PT building without rest breaks or increase in pain    Time 12    Period Weeks    Status New    Target Date 08/07/20                 Plan - 05/21/20 1140    Clinical Impression Statement Patient did well with progression of all ther ex as well as with the new exercises that were added. She required verbal cuing throughout for proper exercise technique, specifically during lumbar stabilization exercises. She would benefit from aerobic conditioning, total body strengthening, and core stability.    PT Treatment/Interventions ADLs/Self Care Home Management;Neuromuscular re-education;Therapeutic exercise;Therapeutic activities;Functional mobility training;Stair training;Gait training;Patient/family education;Manual techniques;Passive range of motion;Scar mobilization    PT Next Visit Plan core stab, general aerobic conditioning/strengthening           Patient will benefit from skilled therapeutic intervention in order to improve the  following deficits and impairments:  Abnormal gait, Difficulty walking, Decreased endurance, Decreased activity tolerance, Impaired flexibility, Decreased strength, Decreased mobility, Postural dysfunction  Visit Diagnosis: Muscle weakness (generalized)  Difficulty in walking, not elsewhere classified     Problem List Patient Active Problem List   Diagnosis Date Noted  . Dyspareunia in female 11/11/2018  . Atrophic vaginitis 11/11/2018  . Acute perforated appendicitis 11/11/2018  . Acute appendicitis with perforation and localized peritonitis 11/11/2018  . Sjogren's syndrome ([place])   . Obesity   . Diverticulosis of colon  11/10/2012  . Hyperlipidemia 02/24/2007  . Essential hypertension 02/24/2007    Lavenia Atlas, SPTA 05/21/2020, 11:43 AM  [place]. [place], [place], [address] Phone: [phone]   Fax:  [phone]  Name: CARLING LIBERMAN MRN: [id number] Date of Birth: [birth year]

## 2020-05-23 ENCOUNTER — Ambulatory Visit: Payer: PPO | Admitting: Physical Therapy

## 2020-05-23 ENCOUNTER — Other Ambulatory Visit: Payer: Self-pay

## 2020-05-23 ENCOUNTER — Encounter: Payer: Self-pay | Admitting: Physical Therapy

## 2020-05-23 DIAGNOSIS — R262 Difficulty in walking, not elsewhere classified: Secondary | ICD-10-CM

## 2020-05-23 DIAGNOSIS — M6281 Muscle weakness (generalized): Secondary | ICD-10-CM

## 2020-05-23 NOTE — Therapy (Signed)
[place]. [place], [place], [address] Phone: [phone]   Fax:  [phone]  Physical Therapy Treatment  Patient Details  Name: Kristy Sherman MRN: [id number] Date of Birth: [birth year] Referring Provider (PT): Rupashree   Encounter Date: 05/23/2020   PT End of Session - 05/23/20 1133    Visit Number 3    Date for PT Re-Evaluation 07/15/20    PT Start Time 1103    PT Stop Time 1142    PT Time Calculation (min) 39 min    Activity Tolerance Patient tolerated treatment well    Behavior During Therapy [facility] for tasks assessed/performed           Past Medical History:  Diagnosis Date  . ANKLE PAIN, LEFT 10/17/2007   Qualifier: Diagnosis of  By: Jerold Coombe    . Hyperlipidemia   . Hypertension   . Obesity   . Sjoegren syndrome     Past Surgical History:  Procedure Laterality Date  . [place]  . LAPAROSCOPIC APPENDECTOMY N/A 11/11/2018   Procedure: APPENDECTOMY LAPAROSCOPIC;  Surgeon: Michael Boston, MD;  Location[facility];  Service: General;  Laterality: N/A;    There were no vitals filed for this visit.   Subjective Assessment - 05/23/20 1105    Subjective I feel pretty good. Reports she is sore from last visit. Patient stated she is a little SOB but is still able to talk and feels well.    Currently in Pain? No/denies    Pain Score 0-No pain                             [facility] Adult PT Treatment/Exercise - 05/23/20 0001      Lumbar Exercises: Standing   Scapular Retraction 20 reps;Theraband   red 3" hold   Shoulder ADduction 20 reps;Theraband   Red      Lumbar Exercises: Seated   Other Seated Lumbar Exercises Yellow Mini ball chest press to [facility] press #5 x10, Mini ball trunk rot. 2x5    Other Seated Lumbar Exercises Physioball ISO's 10x3"      Lumbar Exercises: Supine   Pelvic Tilt 10 reps;5 seconds    Bridge 10 reps    Bridge Limitations On physioball      Bridge with [employer] 10 reps;3 seconds    Other Supine Lumbar Exercises Physio DK2C/LTR x10    Other Supine Lumbar Exercises PPT + alt marches 2x5      Lumbar Exercises: Sidelying   Clam Both;10 reps;3 seconds                    PT Short Term Goals - 05/15/20 1159      PT SHORT TERM GOAL #1   Title Pt will be I with initial HEP    Time 2    Period Weeks    Status New    Target Date 05/29/20             PT Long Term Goals - 05/15/20 1200      PT LONG TERM GOAL #1   Title Pt will be I with advanced HEP    Time 12    Period Weeks    Status New    Target Date 08/07/20      PT LONG TERM GOAL #2   Title Pt will demo BLE strength 4+/5    Time  12    Period Weeks    Status New    Target Date 08/07/20      PT LONG TERM GOAL #3   Title Pt will report able to stand in kitchen to cook meal or >30 min standing    Baseline 5 min    Time 12    Period Weeks    Status New    Target Date 08/07/20      PT LONG TERM GOAL #4   Title Pt will demo able to walk 2 laps around PT building without rest breaks or increase in pain    Time 12    Period Weeks    Status New    Target Date 08/07/20                 Plan - 05/23/20 1137    Clinical Impression Statement Patient was sore from last treatment session so we dialed back repetitions on the exercises today and provided some modifications for the more challenging ones. She did well with all modifications and had no c/o of stomach pain or discomfort throughout treatment. Needed verbal cuing for proper exercise technique during S/L clams. Asked pt. to bring in her HEP next week for revision and to make sure she is performing all exercises correctly with confidence.    PT Treatment/Interventions ADLs/Self Care Home Management;Neuromuscular re-education;Therapeutic exercise;Therapeutic activities;Functional mobility training;Stair training;Gait training;Patient/family education;Manual techniques;Passive range of  motion;Scar mobilization    PT Next Visit Plan core stab, general aerobic conditioning/strengthening           Patient will benefit from skilled therapeutic intervention in order to improve the following deficits and impairments:  Abnormal gait, Difficulty walking, Decreased endurance, Decreased activity tolerance, Impaired flexibility, Decreased strength, Decreased mobility, Postural dysfunction  Visit Diagnosis: Muscle weakness (generalized)  Difficulty in walking, not elsewhere classified     Problem List Patient Active Problem List   Diagnosis Date Noted  . Dyspareunia in female 11/11/2018  . Atrophic vaginitis 11/11/2018  . Acute perforated appendicitis 11/11/2018  . Acute appendicitis with perforation and localized peritonitis 11/11/2018  . Sjogren's syndrome ([place])   . Obesity   . Diverticulosis of colon  11/10/2012  . Hyperlipidemia 02/24/2007  . Essential hypertension 02/24/2007    Lavenia Atlas, SPTA 05/23/2020, 11:45 AM  [place]. [place], [place], [address] Phone: [phone]   Fax:  [phone]  Name: MELISSSA DONNER MRN: [id number] Date of Birth: 1950-11-19

## 2020-05-28 ENCOUNTER — Ambulatory Visit: Payer: PPO | Admitting: Physical Therapy

## 2020-05-28 ENCOUNTER — Encounter: Payer: Self-pay | Admitting: Physical Therapy

## 2020-05-28 ENCOUNTER — Other Ambulatory Visit: Payer: Self-pay

## 2020-05-28 DIAGNOSIS — R262 Difficulty in walking, not elsewhere classified: Secondary | ICD-10-CM

## 2020-05-28 DIAGNOSIS — M6281 Muscle weakness (generalized): Secondary | ICD-10-CM | POA: Diagnosis not present

## 2020-05-28 NOTE — Therapy (Signed)
[place]. [place], [place], [address] Phone: [phone]   Fax:  [phone]  Physical Therapy Treatment  Patient Details  Name: Kristy Sherman MRN: [id number] Date of Birth: 1951/01/11 Referring Provider (PT): Rupashree   Encounter Date: 05/28/2020   PT End of Session - 05/28/20 1152    Visit Number 4    Date for PT Re-Evaluation 07/15/20    PT Start Time 1102    PT Stop Time 1144    PT Time Calculation (min) 42 min    Activity Tolerance Patient tolerated treatment well;Patient limited by fatigue    Behavior During Therapy [facility] for tasks assessed/performed           Past Medical History:  Diagnosis Date  . ANKLE PAIN, LEFT 10/17/2007   Qualifier: Diagnosis of  By: Jerold Coombe    . Hyperlipidemia   . Hypertension   . Obesity   . Sjoegren syndrome     Past Surgical History:  Procedure Laterality Date  . [place]  . LAPAROSCOPIC APPENDECTOMY N/A 11/11/2018   Procedure: APPENDECTOMY LAPAROSCOPIC;  Surgeon: Michael Boston, MD;  Location[facility];  Service: General;  Laterality: N/A;    There were no vitals filed for this visit.   Subjective Assessment - 05/28/20 1102    Subjective Pt reports feeling pretty good; just a little pulling in abdominals, no pain. Pt would like to go through and consolidate HEP from here and HHPT    Currently in Pain? No/denies    Pain Score 0-No pain                             [facility] Adult PT Treatment/Exercise - 05/28/20 0001      Self-Care   Self-Care Other Self-Care Comments    Other Self-Care Comments  updated/consolidated HEP      Lumbar Exercises: Aerobic   UBE (Upper Arm Bike) L2 3 min fwd/3 min bkwd    Recumbent Bike L2 x 6 min      Lumbar Exercises: Standing   Other Standing Lumbar Exercises standing hip abd/ext/flex knee flex 1x10 2#      Lumbar Exercises: Seated   Other Seated Lumbar Exercises Yellow Mini  ball chest press to [facility] press #5 x10, Mini ball trunk rot. 2x5    Other Seated Lumbar Exercises Physioball ISO's 10x3"                    PT Short Term Goals - 05/28/20 1155      PT SHORT TERM GOAL #1   Title Pt will be I with initial HEP    Time 2    Period Weeks    Status Achieved    Target Date 05/29/20             PT Long Term Goals - 05/28/20 1155      PT LONG TERM GOAL #1   Title Pt will be I with advanced HEP    Time 12    Period Weeks    Status On-going      PT LONG TERM GOAL #2   Title Pt will demo BLE strength 4+/5    Time 12    Period Weeks    Status New      PT LONG TERM GOAL #3   Title Pt will report able to stand in kitchen to cook meal or >  30 min standing    Baseline 5 min    Time 12    Period Weeks    Status On-going      PT LONG TERM GOAL #4   Title Pt will demo able to walk 2 laps around PT building without rest breaks or increase in pain    Time 12    Period Weeks    Status On-going                 Plan - 05/28/20 1153    Clinical Impression Statement Pt able to tolerate additional endurance ex's this rx; states that she has increased walking distance to 1/2 mile at a time and on "good days" can do up to a mile in 1/2 parts. Revised/consolidated HEP to include some ex's from [facility]; instructed pt to split ex's into separate days with pt VU. Verbal cueing for avoiding compensations with standing hip ex's. Required frequent seated rest breaks. Continue to progress strength/endurance.    PT Treatment/Interventions ADLs/Self Care Home Management;Neuromuscular re-education;Therapeutic exercise;Therapeutic activities;Functional mobility training;Stair training;Gait training;Patient/family education;Manual techniques;Passive range of motion;Scar mobilization    PT Next Visit Plan core stab, general aerobic conditioning/strengthening    Consulted and Agree with Plan of Care Patient           Patient will benefit from skilled  therapeutic intervention in order to improve the following deficits and impairments:  Abnormal gait, Difficulty walking, Decreased endurance, Decreased activity tolerance, Impaired flexibility, Decreased strength, Decreased mobility, Postural dysfunction  Visit Diagnosis: Muscle weakness (generalized)  Difficulty in walking, not elsewhere classified     Problem List Patient Active Problem List   Diagnosis Date Noted  . Dyspareunia in female 11/11/2018  . Atrophic vaginitis 11/11/2018  . Acute perforated appendicitis 11/11/2018  . Acute appendicitis with perforation and localized peritonitis 11/11/2018  . Sjogren's syndrome ([place])   . Obesity   . Diverticulosis of colon  11/10/2012  . Hyperlipidemia 02/24/2007  . Essential hypertension 02/24/2007   Amador Cunas, PT, DPT Donald Prose Earnstine Meinders 05/28/2020, 11:56 AM  [place]. [place], [place], [address] Phone: [phone]   Fax:  [phone]  Name: AERIS HERSMAN MRN: [id number] Date of Birth: 05/26/1951

## 2020-05-30 ENCOUNTER — Ambulatory Visit: Payer: PPO | Admitting: Physical Therapy

## 2020-05-30 ENCOUNTER — Other Ambulatory Visit: Payer: Self-pay

## 2020-05-30 ENCOUNTER — Encounter: Payer: Self-pay | Admitting: Physical Therapy

## 2020-05-30 DIAGNOSIS — Z01411 Encounter for gynecological examination (general) (routine) with abnormal findings: Secondary | ICD-10-CM | POA: Diagnosis not present

## 2020-05-30 DIAGNOSIS — R32 Unspecified urinary incontinence: Secondary | ICD-10-CM | POA: Diagnosis not present

## 2020-05-30 DIAGNOSIS — N898 Other specified noninflammatory disorders of vagina: Secondary | ICD-10-CM | POA: Diagnosis not present

## 2020-05-30 DIAGNOSIS — M6281 Muscle weakness (generalized): Secondary | ICD-10-CM

## 2020-05-30 DIAGNOSIS — Z6829 Body mass index (BMI) 29.0-29.9, adult: Secondary | ICD-10-CM | POA: Diagnosis not present

## 2020-05-30 DIAGNOSIS — M8589 Other specified disorders of bone density and structure, multiple sites: Secondary | ICD-10-CM | POA: Diagnosis not present

## 2020-05-30 DIAGNOSIS — Z1231 Encounter for screening mammogram for malignant neoplasm of breast: Secondary | ICD-10-CM | POA: Diagnosis not present

## 2020-05-30 DIAGNOSIS — R262 Difficulty in walking, not elsewhere classified: Secondary | ICD-10-CM

## 2020-05-30 NOTE — Therapy (Signed)
[place]. [place], [place], [address] Phone: [phone]   Fax:  [phone]  Physical Therapy Treatment  Patient Details  Name: Kristy Sherman MRN: [id number] Date of Birth: [birth year] Referring Provider (PT): Rupashree   Encounter Date: 05/30/2020   PT End of Session - 05/30/20 1148    Visit Number 5    Date for PT Re-Evaluation 07/15/20    PT Start Time 1104    PT Stop Time 1143    PT Time Calculation (min) 39 min    Activity Tolerance Patient tolerated treatment well;Patient limited by fatigue    Behavior During Therapy [facility] for tasks assessed/performed           Past Medical History:  Diagnosis Date  . ANKLE PAIN, LEFT 10/17/2007   Qualifier: Diagnosis of  By: Jerold Coombe    . Hyperlipidemia   . Hypertension   . Obesity   . Sjoegren syndrome     Past Surgical History:  Procedure Laterality Date  . [place]  . LAPAROSCOPIC APPENDECTOMY N/A 11/11/2018   Procedure: APPENDECTOMY LAPAROSCOPIC;  Surgeon: Michael Boston, MD;  Location: [facility];  Service: General;  Laterality: N/A;    There were no vitals filed for this visit.   Subjective Assessment - 05/30/20 1108    Subjective Pt reports feeling good; pulling in abdominals    Currently in Pain? No/denies   reports tightness in abdomen   Pain Location Abdomen                             [facility] Adult PT Treatment/Exercise - 05/30/20 0001      Lumbar Exercises: Aerobic   UBE (Upper Arm Bike) L2 3 min fwd/3 min bkwd    Nustep L5 x 6 min      Lumbar Exercises: Standing   Row Both;20 reps    Theraband Level (Row) Level 3 (Green)    Shoulder Extension Both;20 reps    Theraband Level (Shoulder Extension) Level 3 (Green)    Other Standing Lumbar Exercises sidestepping red TB    Other Standing Lumbar Exercises standing toe taps 6" stair no HH      Lumbar Exercises: Seated   Sit to Stand 10 reps    Sit  to Stand Limitations no UE; yellow ball chest press    Other Seated Lumbar Exercises Yellow Mini ball chest press to [facility] press #5 x10, Mini ball trunk rot. 2x5    Other Seated Lumbar Exercises Physioball ISO's 10x3"                    PT Short Term Goals - 05/28/20 1155      PT SHORT TERM GOAL #1   Title Pt will be I with initial HEP    Time 2    Period Weeks    Status Achieved    Target Date 05/29/20             PT Long Term Goals - 05/28/20 1155      PT LONG TERM GOAL #1   Title Pt will be I with advanced HEP    Time 12    Period Weeks    Status On-going      PT LONG TERM GOAL #2   Title Pt will demo BLE strength 4+/5    Time 12    Period Weeks    Status  New      PT LONG TERM GOAL #3   Title Pt will report able to stand in kitchen to cook meal or >30 min standing    Baseline 5 min    Time 12    Period Weeks    Status On-going      PT LONG TERM GOAL #4   Title Pt will demo able to walk 2 laps around PT building without rest breaks or increase in pain    Time 12    Period Weeks    Status On-going                 Plan - 05/30/20 1149    Clinical Impression Statement Pt did well with standing ex's this rx; able to tolerate 15 min of standing ex's before needing a seated rest break. Short, standing rest breaks between each exercise. Cues for form with standing shoulder ext and rows. No increase in pain with interventions; did report mild increase in pulling sensation with overhead yellow ball press. Continue to progress strength/endurance.    PT Treatment/Interventions ADLs/Self Care Home Management;Neuromuscular re-education;Therapeutic exercise;Therapeutic activities;Functional mobility training;Stair training;Gait training;Patient/family education;Manual techniques;Passive range of motion;Scar mobilization    PT Next Visit Plan core stab, general aerobic conditioning/strengthening    Consulted and Agree with Plan of Care Patient            Patient will benefit from skilled therapeutic intervention in order to improve the following deficits and impairments:  Abnormal gait, Difficulty walking, Decreased endurance, Decreased activity tolerance, Impaired flexibility, Decreased strength, Decreased mobility, Postural dysfunction  Visit Diagnosis: Muscle weakness (generalized)  Difficulty in walking, not elsewhere classified     Problem List Patient Active Problem List   Diagnosis Date Noted  . Dyspareunia in female 11/11/2018  . Atrophic vaginitis 11/11/2018  . Acute perforated appendicitis 11/11/2018  . Acute appendicitis with perforation and localized peritonitis 11/11/2018  . Sjogren's syndrome ([place])   . Obesity   . Diverticulosis of colon  11/10/2012  . Hyperlipidemia 02/24/2007  . Essential hypertension 02/24/2007   Amador Cunas, PT, DPT Donald Prose Kristy Sherman 05/30/2020, 11:51 AM  [place]. [place], [place], [address] Phone: [phone]   Fax:  [phone]  Name: Kristy Sherman MRN: [id number] Date of Birth: May 20, 1951

## 2020-06-04 ENCOUNTER — Other Ambulatory Visit: Payer: Self-pay

## 2020-06-04 ENCOUNTER — Ambulatory Visit: Payer: PPO | Admitting: Physical Therapy

## 2020-06-04 ENCOUNTER — Encounter: Payer: Self-pay | Admitting: Physical Therapy

## 2020-06-04 DIAGNOSIS — M6281 Muscle weakness (generalized): Secondary | ICD-10-CM | POA: Diagnosis not present

## 2020-06-04 DIAGNOSIS — R262 Difficulty in walking, not elsewhere classified: Secondary | ICD-10-CM

## 2020-06-04 NOTE — Therapy (Signed)
[place]. [place], [place], [address] Phone: [phone]   Fax:  [phone]  Physical Therapy Treatment  Patient Details  Name: Kristy Sherman MRN: [id number] Date of Birth: March 19, 1951 Referring Provider (PT): Rupashree   Encounter Date: 06/04/2020   PT End of Session - 06/04/20 1106    Visit Number 6    Date for PT Re-Evaluation 07/15/20    PT Start Time 1101    PT Stop Time 1142    PT Time Calculation (min) 41 min    Activity Tolerance Patient tolerated treatment well;Patient limited by fatigue    Behavior During Therapy [facility] for tasks assessed/performed           Past Medical History:  Diagnosis Date  . ANKLE PAIN, LEFT 10/17/2007   Qualifier: Diagnosis of  By: Jerold Coombe    . Hyperlipidemia   . Hypertension   . Obesity   . Sjoegren syndrome     Past Surgical History:  Procedure Laterality Date  . [place]  . LAPAROSCOPIC APPENDECTOMY N/A 11/11/2018   Procedure: APPENDECTOMY LAPAROSCOPIC;  Surgeon: Michael Boston, MD;  Location[facility];  Service: General;  Laterality: N/A;    There were no vitals filed for this visit.   Subjective Assessment - 06/04/20 1104    Subjective Patient is doing well, some pulling in upper abdominal region.    Currently in Pain? No/denies                             [facility] Adult PT Treatment/Exercise - 06/04/20 0001      Lumbar Exercises: Aerobic   Recumbent Bike L2 56mns      Lumbar Exercises: Standing   Other Standing Lumbar Exercises hip abd./ext x10 (Red TB)    Other Standing Lumbar Exercises Slow travel marches x7 laps, REIS x10      Lumbar Exercises: Seated   Other Seated Lumbar Exercises Scap retrac/horizt. abd./ rows 10x3"   Red TB   Other Seated Lumbar Exercises Mini ball press-Oh press x10 (10 sitting/10 standing), D1/D2 flex 2x5,                     PT Short Term Goals - 05/28/20 1155       PT SHORT TERM GOAL #1   Title Pt will be I with initial HEP    Time 2    Period Weeks    Status Achieved    Target Date 05/29/20             PT Long Term Goals - 06/04/20 1107      PT LONG TERM GOAL #1   Title Pt will be I with advanced HEP    Status Achieved      PT LONG TERM GOAL #2   Title Pt will demo BLE strength 4+/5    Status Partially Met      PT LONG TERM GOAL #3   Title Pt will report able to stand in kitchen to cook meal or >30 min standing    Status Achieved      PT LONG TERM GOAL #4   Title Pt will demo able to walk 2 laps around PT building without rest breaks or increase in pain    Baseline on a good day patient reports she can go 1/2 mile    Status Partially Met  Plan - 06/04/20 1106    Clinical Impression Statement Patient did really well progression of ther ex today. She does have minor upper abdominal tightness that causes some discomfort during exercise. Patient felt like tightness was relieved when performing repeated extension in standing. When patient begins to fatigue she compensates with a posterior trunk lean. Continue with core stab and mobility.    PT Treatment/Interventions ADLs/Self Care Home Management;Neuromuscular re-education;Therapeutic exercise;Therapeutic activities;Functional mobility training;Stair training;Gait training;Patient/family education;Manual techniques;Passive range of motion;Scar mobilization    PT Next Visit Plan go for walk outside with pt., core stab, general aerobic conditioning/strengthening           Patient will benefit from skilled therapeutic intervention in order to improve the following deficits and impairments:  Abnormal gait, Difficulty walking, Decreased endurance, Decreased activity tolerance, Impaired flexibility, Decreased strength, Decreased mobility, Postural dysfunction  Visit Diagnosis: Muscle weakness (generalized)  Difficulty in walking, not elsewhere  classified     Problem List Patient Active Problem List   Diagnosis Date Noted  . Dyspareunia in female 11/11/2018  . Atrophic vaginitis 11/11/2018  . Acute perforated appendicitis 11/11/2018  . Acute appendicitis with perforation and localized peritonitis 11/11/2018  . Sjogren's syndrome ([place])   . Obesity   . Diverticulosis of colon  11/10/2012  . Hyperlipidemia 02/24/2007  . Essential hypertension 02/24/2007    Kristy Sherman 06/04/2020, 12:08 PM  [place]. [place], [place], [address] Phone: [phone]   Fax:  [phone]  Name: Kristy Sherman MRN: [id number] Date of Birth: [birth year]

## 2020-06-11 ENCOUNTER — Encounter: Payer: Self-pay | Admitting: Physical Therapy

## 2020-06-11 ENCOUNTER — Other Ambulatory Visit: Payer: Self-pay

## 2020-06-11 ENCOUNTER — Ambulatory Visit: Payer: PPO | Admitting: Physical Therapy

## 2020-06-11 DIAGNOSIS — M6281 Muscle weakness (generalized): Secondary | ICD-10-CM | POA: Diagnosis not present

## 2020-06-11 DIAGNOSIS — R262 Difficulty in walking, not elsewhere classified: Secondary | ICD-10-CM

## 2020-06-11 NOTE — Therapy (Signed)
[place]. [place], [place], [address] Phone: [phone]   Fax:  [phone]  Physical Therapy Treatment  Patient Details  Name: Kristy Sherman MRN: [id number] Date of Birth: [birth year] Referring Provider (PT): Rupashree   Encounter Date: 06/11/2020   PT End of Session - 06/11/20 1140    Visit Number 7    Date for PT Re-Evaluation 07/15/20    PT Start Time 1100    PT Stop Time 1141    PT Time Calculation (min) 41 min    Activity Tolerance Patient tolerated treatment well;Patient limited by fatigue    Behavior During Therapy [facility] for tasks assessed/performed           Past Medical History:  Diagnosis Date  . ANKLE PAIN, LEFT 10/17/2007   Qualifier: Diagnosis of  By: Jerold Coombe    . Hyperlipidemia   . Hypertension   . Obesity   . Sjoegren syndrome     Past Surgical History:  Procedure Laterality Date  . [place]  . LAPAROSCOPIC APPENDECTOMY N/A 11/11/2018   Procedure: APPENDECTOMY LAPAROSCOPIC;  Surgeon: Michael Boston, MD;  Location: [facility];  Service: General;  Laterality: N/A;    There were no vitals filed for this visit.   Subjective Assessment - 06/11/20 1110    Subjective Feels good today.    Currently in Pain? No/denies                             [facility] Adult PT Treatment/Exercise - 06/11/20 0001      Lumbar Exercises: Aerobic   Nustep L5 30mns      Lumbar Exercises: Standing   Row Both    Row Limitations Machine row/lats #10 2x10,     Other Standing Lumbar Exercises slow traveling marches x5 laps (#3 ankle wts.), Hip abd/ext x10 #3,     Other Standing Lumbar Exercises Resisted gait x7 #10      Lumbar Exercises: Seated   Other Seated Lumbar Exercises REIS (seated) x10 black band                    PT Short Term Goals - 05/28/20 1155      PT SHORT TERM GOAL #1   Title Pt will be I with initial HEP    Time 2    Period  Weeks    Status Achieved    Target Date 05/29/20             PT Long Term Goals - 06/04/20 1107      PT LONG TERM GOAL #1   Title Pt will be I with advanced HEP    Status Achieved      PT LONG TERM GOAL #2   Title Pt will demo BLE strength 4+/5    Status Partially Met      PT LONG TERM GOAL #3   Title Pt will report able to stand in kitchen to cook meal or >30 min standing    Status Achieved      PT LONG TERM GOAL #4   Title Pt will demo able to walk 2 laps around PT building without rest breaks or increase in pain    Baseline on a good day patient reports she can go 1/2 mile    Status Partially Met  Plan - 06/11/20 1140    Clinical Impression Statement Patient presented to clinic today doing really well with no c/o usual pain or abdominal tightness. Progressed her on bike by adding more resistence, added weight to the traveling marches, and introduced her to some basic machine strengthening exercises as well as resisted gait. She required CGA for reisted gait due to some instability and balance defictis. Overall is doing really well and seeing great benefits from therapy.    PT Treatment/Interventions ADLs/Self Care Home Management;Neuromuscular re-education;Therapeutic exercise;Therapeutic activities;Functional mobility training;Stair training;Gait training;Patient/family education;Manual techniques;Passive range of motion;Scar mobilization    PT Next Visit Plan Walk outside with patient navigating all terrain.           Patient will benefit from skilled therapeutic intervention in order to improve the following deficits and impairments:  Abnormal gait, Difficulty walking, Decreased endurance, Decreased activity tolerance, Impaired flexibility, Decreased strength, Decreased mobility, Postural dysfunction  Visit Diagnosis: Muscle weakness (generalized)  Difficulty in walking, not elsewhere classified     Problem List Patient Active Problem List    Diagnosis Date Noted  . Dyspareunia in female 11/11/2018  . Atrophic vaginitis 11/11/2018  . Acute perforated appendicitis 11/11/2018  . Acute appendicitis with perforation and localized peritonitis 11/11/2018  . Sjogren's syndrome ([place])   . Obesity   . Diverticulosis of colon  11/10/2012  . Hyperlipidemia 02/24/2007  . Essential hypertension 02/24/2007    Lavenia Atlas, SPTA 06/11/2020, 11:45 AM  [place]. [place], [place], [address] Phone: [phone]   Fax:  [phone]  Name: Kristy Sherman MRN: [id number] Date of Birth: 1951-01-22

## 2020-06-13 ENCOUNTER — Encounter: Payer: Self-pay | Admitting: Physical Therapy

## 2020-06-13 ENCOUNTER — Ambulatory Visit: Payer: PPO | Attending: Internal Medicine | Admitting: Physical Therapy

## 2020-06-13 ENCOUNTER — Other Ambulatory Visit: Payer: Self-pay

## 2020-06-13 DIAGNOSIS — M6281 Muscle weakness (generalized): Secondary | ICD-10-CM

## 2020-06-13 DIAGNOSIS — R262 Difficulty in walking, not elsewhere classified: Secondary | ICD-10-CM | POA: Diagnosis not present

## 2020-06-13 NOTE — Therapy (Signed)
[place]. [place], [place], [address] Phone: [phone]   Fax:  [phone]  Physical Therapy Treatment  Patient Details  Name: Kristy Sherman MRN: [id number] Date of Birth: April 27, 1951 Referring Provider (PT): Rupashree   Encounter Date: 06/13/2020   PT End of Session - 06/13/20 1106    Visit Number 8    Date for PT Re-Evaluation 07/15/20    PT Start Time 1101    PT Stop Time 1144    PT Time Calculation (min) 43 min    Activity Tolerance Patient tolerated treatment well;Patient limited by fatigue    Behavior During Therapy [facility] for tasks assessed/performed           Past Medical History:  Diagnosis Date  . ANKLE PAIN, LEFT 10/17/2007   Qualifier: Diagnosis of  By: Jerold Coombe    . Hyperlipidemia   . Hypertension   . Obesity   . Sjoegren syndrome     Past Surgical History:  Procedure Laterality Date  . [place]  . LAPAROSCOPIC APPENDECTOMY N/A 11/11/2018   Procedure: APPENDECTOMY LAPAROSCOPIC;  Surgeon: Michael Boston, MD;  Location: [facility];  Service: General;  Laterality: N/A;    There were no vitals filed for this visit.   Subjective Assessment - 06/13/20 1104    Subjective Doing good today. Reports not sleeping well last night unrelated to abdominal tightness/pain.    Currently in Pain? No/denies                             [facility] Adult PT Treatment/Exercise - 06/13/20 0001      Ambulation/Gait   Ambulation/Gait Yes    Ambulation/Gait Assistance 5: Supervision    Ambulation/Gait Assistance Details 2 Building laps outside    Ambulation Surface Unlevel;Outdoor;Paved;Gravel;Grass;Level      Lumbar Exercises: Aerobic   Recumbent Bike L1.5 6 mins      Lumbar Exercises: Standing   Other Standing Lumbar Exercises Paloff press 2x10 ea side (red tb), Alt Marching with ISO shoulder Ext. 2x10 (yelllow TB)      Lumbar Exercises: Seated   Other Seated  Lumbar Exercises Chest press to [facility] press (wt bar #3) 2x10, trunk rot 2x10 (mini ball)                    PT Short Term Goals - 05/28/20 1155      PT SHORT TERM GOAL #1   Title Pt will be I with initial HEP    Time 2    Period Weeks    Status Achieved    Target Date 05/29/20             PT Long Term Goals - 06/04/20 1107      PT LONG TERM GOAL #1   Title Pt will be I with advanced HEP    Status Achieved      PT LONG TERM GOAL #2   Title Pt will demo BLE strength 4+/5    Status Partially Met      PT LONG TERM GOAL #3   Title Pt will report able to stand in kitchen to cook meal or >30 min standing    Status Achieved      PT LONG TERM GOAL #4   Title Pt will demo able to walk 2 laps around PT building without rest breaks or increase in pain    Baseline  on a good day patient reports she can go 1/2 mile    Status Partially Met                 Plan - 06/13/20 1106    Clinical Impression Statement Continues to feel well with no pain or discomfort. Does still get mild soreness after PT but no pain or adverse effects associated with it. Went for a walk outdoors per patient's request. She has been wanting to work on her aerobic walking endurance including going up and down hill inclines. She did well and was able to complete 2 full laps with a small rest break inbetween laps. Worked on more dynamic core stab to provide her with more lumbar and trunk support during her everyday activites such as making the bed, grocery shopping and going for walks outdoors.    PT Treatment/Interventions ADLs/Self Care Home Management;Neuromuscular re-education;Therapeutic exercise;Therapeutic activities;Functional mobility training;Stair training;Gait training;Patient/family education;Manual techniques;Passive range of motion;Scar mobilization    PT Next Visit Plan balance/stability, core stab, aerobic endurance.           Patient will benefit from skilled therapeutic intervention  in order to improve the following deficits and impairments:  Abnormal gait, Difficulty walking, Decreased endurance, Decreased activity tolerance, Impaired flexibility, Decreased strength, Decreased mobility, Postural dysfunction  Visit Diagnosis: Muscle weakness (generalized)  Difficulty in walking, not elsewhere classified     Problem List Patient Active Problem List   Diagnosis Date Noted  . Dyspareunia in female 11/11/2018  . Atrophic vaginitis 11/11/2018  . Acute perforated appendicitis 11/11/2018  . Acute appendicitis with perforation and localized peritonitis 11/11/2018  . Sjogren's syndrome ([place])   . Obesity   . Diverticulosis of colon  11/10/2012  . Hyperlipidemia 02/24/2007  . Essential hypertension 02/24/2007    Lavenia Atlas, SPTA 06/13/2020, 11:48 AM  [place]. [place], [place], [address] Phone: [phone]   Fax:  [phone]  Name: Kristy Sherman MRN: [id number] Date of Birth: 1950-10-25

## 2020-06-21 ENCOUNTER — Other Ambulatory Visit: Payer: Self-pay

## 2020-06-21 ENCOUNTER — Ambulatory Visit: Payer: PPO | Admitting: Physical Therapy

## 2020-06-21 DIAGNOSIS — R262 Difficulty in walking, not elsewhere classified: Secondary | ICD-10-CM

## 2020-06-21 DIAGNOSIS — M6281 Muscle weakness (generalized): Secondary | ICD-10-CM | POA: Diagnosis not present

## 2020-06-21 NOTE — Therapy (Signed)
[place]. [place], [place], [address] Phone: [phone]   Fax:  [phone]  Physical Therapy Treatment  Patient Details  Name: Kristy Sherman MRN: [id number] Date of Birth: 1950-10-20 Referring Provider (PT): Ronie Spies   Encounter Date: 06/21/2020   PT End of Session - 06/21/20 0958    Visit Number 9    Date for PT Re-Evaluation 07/15/20    PT Start Time 0932    PT Stop Time 1015    PT Time Calculation (min) 43 min           Past Medical History:  Diagnosis Date  . ANKLE PAIN, LEFT 10/17/2007   Qualifier: Diagnosis of  By: Jerold Coombe    . Hyperlipidemia   . Hypertension   . Obesity   . Sjoegren syndrome     Past Surgical History:  Procedure Laterality Date  . [place]  . LAPAROSCOPIC APPENDECTOMY N/A 11/11/2018   Procedure: APPENDECTOMY LAPAROSCOPIC;  Surgeon: Michael Boston, MD;  Location: [facility];  Service: General;  Laterality: N/A;    There were no vitals filed for this visit.   Subjective Assessment - 06/21/20 0936    Subjective overall 80% better.SOB at times with actvity. pt verb doing HEP- still trying to get stronger 10 min on TM and 20 on bik at [facility]. will start back to water aerobics    Currently in Pain? No/denies                             [facility] Adult PT Treatment/Exercise - 06/21/20 0001      Lumbar Exercises: Aerobic   UBE (Upper Arm Bike) L 3 2 fwd/2 back    Nustep L 5 8 min      Lumbar Exercises: Machines for Strengthening   Cybex Lumbar Extension black tband flex and ext 2 sets 10    Cybex Knee Extension 10# 2 sets 10    Cybex Knee Flexion 20# 2 sets10      Lumbar Exercises: Standing   Other Standing Lumbar Exercises cable pulley hip 4 way 10 each BIL   10#   Other Standing Lumbar Exercises 10# 5 x fwd/back and 5 x each side                    PT Short Term Goals - 05/28/20 1155      PT SHORT TERM GOAL #1    Title Pt will be I with initial HEP    Time 2    Period Weeks    Status Achieved    Target Date 05/29/20             PT Long Term Goals - 06/21/20 0938      PT LONG TERM GOAL #2   Title Pt will demo BLE strength 4+/5    Status Partially Met      PT LONG TERM GOAL #4   Title Pt will demo able to walk 2 laps around PT building without rest breaks or increase in pain    Status Partially Met                 Plan - 06/21/20 0959    Clinical Impression Statement progressing with all goals, started to slowly return to [facility] and okay from MD to get back in water. hip weakness noted with MMT and with gait. reviewed gym return  ex and safety    PT Treatment/Interventions ADLs/Self Care Home Management;Neuromuscular re-education;Therapeutic exercise;Therapeutic activities;Functional mobility training;Stair training;Gait training;Patient/family education;Manual techniques;Passive range of motion;Scar mobilization    PT Next Visit Plan balance/stability, core stab, aerobic endurance. Decrease freq to 1 x a week for 2 weeks as she transitions to [facility]           Patient will benefit from skilled therapeutic intervention in order to improve the following deficits and impairments:  Abnormal gait,Difficulty walking,Decreased endurance,Decreased activity tolerance,Impaired flexibility,Decreased strength,Decreased mobility,Postural dysfunction  Visit Diagnosis: Muscle weakness (generalized)  Difficulty in walking, not elsewhere classified     Problem List Patient Active Problem List   Diagnosis Date Noted  . Dyspareunia in female 11/11/2018  . Atrophic vaginitis 11/11/2018  . Acute perforated appendicitis 11/11/2018  . Acute appendicitis with perforation and localized peritonitis 11/11/2018  . Sjogren's syndrome ([place])   . Obesity   . Diverticulosis of colon  11/10/2012  . Hyperlipidemia 02/24/2007  . Essential hypertension 02/24/2007    PAYSEUR,ANGIE PTA 06/21/2020, 10:01  AM  [place]. [place], [place], [address] Phone: [phone]   Fax:  [phone]  Name: DESSIRE GRIMES MRN: [id number] Date of Birth: 31-Dec-1950

## 2020-06-27 ENCOUNTER — Other Ambulatory Visit: Payer: Self-pay

## 2020-06-27 ENCOUNTER — Ambulatory Visit: Payer: PPO | Admitting: Physical Therapy

## 2020-06-27 DIAGNOSIS — M6281 Muscle weakness (generalized): Secondary | ICD-10-CM | POA: Diagnosis not present

## 2020-06-27 DIAGNOSIS — R262 Difficulty in walking, not elsewhere classified: Secondary | ICD-10-CM

## 2020-06-27 NOTE — Therapy (Signed)
[place]. [place], [place], [address] Phone: [phone]   Fax:  [phone] Progress Note Reporting Period 05/15/20 to 06/27/20 for the first 10 visits  See note below for Objective Data and Assessment of Progress/Goals.      Physical Therapy Treatment  Patient Details  Name: Kristy Sherman MRN: [id number] Date of Birth: 22-Sep-1950 Referring Provider (PT): Ronie Spies   Encounter Date: 06/27/2020   PT End of Session - 06/27/20 1131    Visit Number 10    Date for PT Re-Evaluation 07/15/20    PT Start Time 1100    PT Stop Time 1140    PT Time Calculation (min) 40 min           Past Medical History:  Diagnosis Date  . ANKLE PAIN, LEFT 10/17/2007   Qualifier: Diagnosis of  By: Jerold Coombe    . Hyperlipidemia   . Hypertension   . Obesity   . Sjoegren syndrome     Past Surgical History:  Procedure Laterality Date  . [place]  . LAPAROSCOPIC APPENDECTOMY N/A 11/11/2018   Procedure: APPENDECTOMY LAPAROSCOPIC;  Surgeon: Michael Boston, MD;  Location: [facility];  Service: General;  Laterality: N/A;    There were no vitals filed for this visit.   Subjective Assessment - 06/27/20 1103    Subjective alittle sore from i think trunk ext last time, been at gym a few times and did well    Currently in Pain? No/denies                             [facility] Adult PT Treatment/Exercise - 06/27/20 0001      Lumbar Exercises: Aerobic   UBE (Upper Arm Bike) L 3 3 fwd/2 3back    Nustep L 5 6 min      Lumbar Exercises: Machines for Strengthening   Leg Press 30# 3 sets 10      Lumbar Exercises: Standing   Other Standing Lumbar Exercises 6 inch step up fwd with opp leg ext 10x,step up laterally with opp leg abd 10 each with UE support    Other Standing Lumbar Exercises 6inch alt step tap 20 5# fwd and laterally      Lumbar Exercises: Seated   Sit to Stand 20 reps   wt ball  chest and OH press, feet on airex   Other Seated Lumbar Exercises blue tband hip abd 2 sets 15                    PT Short Term Goals - 05/28/20 1155      PT SHORT TERM GOAL #1   Title Pt will be I with initial HEP    Time 2    Period Weeks    Status Achieved    Target Date 05/29/20             PT Long Term Goals - 06/27/20 1131      PT LONG TERM GOAL #2   Title Pt will demo BLE strength 4+/5    Status Partially Met      PT LONG TERM GOAL #4   Baseline on a good day patient reports she can go 1/2 mile    Status Achieved                 Plan - 06/27/20 1137    Clinical Impression  Statement pt is transitioning back to gyma nd doing well. goals met except still noted hip weakness esp withgait and as fatigues    PT Treatment/Interventions ADLs/Self Care Home Management;Neuromuscular re-education;Therapeutic exercise;Therapeutic activities;Functional mobility training;Stair training;Gait training;Patient/family education;Manual techniques;Passive range of motion;Scar mobilization    PT Next Visit Plan D/C next visit if all going well           Patient will benefit from skilled therapeutic intervention in order to improve the following deficits and impairments:  Abnormal gait,Difficulty walking,Decreased endurance,Decreased activity tolerance,Impaired flexibility,Decreased strength,Decreased mobility,Postural dysfunction  Visit Diagnosis: Muscle weakness (generalized)  Difficulty in walking, not elsewhere classified     Problem List Patient Active Problem List   Diagnosis Date Noted  . Dyspareunia in female 11/11/2018  . Atrophic vaginitis 11/11/2018  . Acute perforated appendicitis 11/11/2018  . Acute appendicitis with perforation and localized peritonitis 11/11/2018  . Sjogren's syndrome ([place])   . Obesity   . Diverticulosis of colon  11/10/2012  . Hyperlipidemia 02/24/2007  . Essential hypertension 02/24/2007    Latona Krichbaum,ANGIE  PTA 06/27/2020, 11:40 AM  [place]. [place], [place], [address] Phone: [phone]   Fax:  [phone]  Name: Kristy Sherman MRN: [id number] Date of Birth: 11/30/1950

## 2020-07-04 ENCOUNTER — Ambulatory Visit: Payer: PPO | Admitting: Physical Therapy

## 2020-07-26 DIAGNOSIS — E785 Hyperlipidemia, unspecified: Secondary | ICD-10-CM | POA: Diagnosis not present

## 2020-07-26 DIAGNOSIS — I10 Essential (primary) hypertension: Secondary | ICD-10-CM | POA: Diagnosis not present

## 2020-07-26 DIAGNOSIS — C786 Secondary malignant neoplasm of retroperitoneum and peritoneum: Secondary | ICD-10-CM | POA: Diagnosis not present

## 2020-07-26 DIAGNOSIS — Z9081 Acquired absence of spleen: Secondary | ICD-10-CM | POA: Diagnosis not present

## 2020-07-26 DIAGNOSIS — K5901 Slow transit constipation: Secondary | ICD-10-CM | POA: Diagnosis not present

## 2020-09-10 ENCOUNTER — Other Ambulatory Visit: Payer: Self-pay

## 2020-09-10 DIAGNOSIS — R1110 Vomiting, unspecified: Secondary | ICD-10-CM | POA: Diagnosis not present

## 2020-09-10 DIAGNOSIS — K2900 Acute gastritis without bleeding: Secondary | ICD-10-CM | POA: Diagnosis not present

## 2020-09-10 DIAGNOSIS — R1013 Epigastric pain: Secondary | ICD-10-CM | POA: Diagnosis not present

## 2020-09-10 DIAGNOSIS — R112 Nausea with vomiting, unspecified: Secondary | ICD-10-CM | POA: Insufficient documentation

## 2020-09-10 DIAGNOSIS — I10 Essential (primary) hypertension: Secondary | ICD-10-CM | POA: Diagnosis not present

## 2020-09-10 DIAGNOSIS — Z79899 Other long term (current) drug therapy: Secondary | ICD-10-CM | POA: Diagnosis not present

## 2020-09-10 DIAGNOSIS — Z7982 Long term (current) use of aspirin: Secondary | ICD-10-CM | POA: Insufficient documentation

## 2020-09-10 DIAGNOSIS — R109 Unspecified abdominal pain: Secondary | ICD-10-CM | POA: Diagnosis not present

## 2020-09-11 ENCOUNTER — Emergency Department (HOSPITAL_BASED_OUTPATIENT_CLINIC_OR_DEPARTMENT_OTHER)
Admission: EM | Admit: 2020-09-11 | Discharge: 2020-09-11 | Disposition: A | Discharge status: Home / Self Care | Payer: PPO | Attending: Emergency Medicine | Admitting: Emergency Medicine

## 2020-09-11 ENCOUNTER — Emergency Department (HOSPITAL_BASED_OUTPATIENT_CLINIC_OR_DEPARTMENT_OTHER): Payer: PPO

## 2020-09-11 ENCOUNTER — Encounter (HOSPITAL_BASED_OUTPATIENT_CLINIC_OR_DEPARTMENT_OTHER): Payer: Self-pay | Admitting: Emergency Medicine

## 2020-09-11 DIAGNOSIS — R109 Unspecified abdominal pain: Secondary | ICD-10-CM | POA: Diagnosis not present

## 2020-09-11 DIAGNOSIS — R1013 Epigastric pain: Secondary | ICD-10-CM | POA: Diagnosis not present

## 2020-09-11 DIAGNOSIS — R1110 Vomiting, unspecified: Secondary | ICD-10-CM | POA: Diagnosis not present

## 2020-09-11 DIAGNOSIS — R112 Nausea with vomiting, unspecified: Secondary | ICD-10-CM

## 2020-09-11 LAB — URINALYSIS, ROUTINE W REFLEX MICROSCOPIC
Bilirubin Urine: NEGATIVE
Glucose, UA: NEGATIVE mg/dL
Hgb urine dipstick: NEGATIVE
Ketones, ur: NEGATIVE mg/dL
Leukocytes,Ua: NEGATIVE
Nitrite: NEGATIVE
Protein, ur: NEGATIVE mg/dL
Specific Gravity, Urine: 1.02 (ref 1.005–1.030)
pH: 6.5 (ref 5.0–8.0)

## 2020-09-11 LAB — COMPREHENSIVE METABOLIC PANEL
ALT: 35 U/L (ref 0–44)
AST: 32 U/L (ref 15–41)
Albumin: 4.2 g/dL (ref 3.5–5.0)
Alkaline Phosphatase: 84 U/L (ref 38–126)
Anion gap: 10 (ref 5–15)
BUN: 18 mg/dL (ref 8–23)
CO2: 25 mmol/L (ref 22–32)
Calcium: 10.2 mg/dL (ref 8.9–10.3)
Chloride: 104 mmol/L (ref 98–111)
Creatinine, Ser: 1.06 mg/dL — ABNORMAL HIGH (ref 0.44–1.00)
GFR, Estimated: 58 mL/min — ABNORMAL LOW (ref >60)
Glucose, Bld: 129 mg/dL — ABNORMAL HIGH (ref 70–99)
Potassium: 3.6 mmol/L (ref 3.5–5.1)
Sodium: 139 mmol/L (ref 135–145)
Total Bilirubin: 0.4 mg/dL (ref 0.3–1.2)
Total Protein: 8.3 g/dL — ABNORMAL HIGH (ref 6.5–8.1)

## 2020-09-11 LAB — CBC WITH DIFFERENTIAL/PLATELET
Abs Immature Granulocytes: 0.01 10*3/uL (ref 0.00–0.07)
Basophils Absolute: 0 10*3/uL (ref 0.0–0.1)
Basophils Relative: 0 %
Eosinophils Absolute: 0.1 10*3/uL (ref 0.0–0.5)
Eosinophils Relative: 1 %
HCT: 39.7 % (ref 36.0–46.0)
Hemoglobin: 13.3 g/dL (ref 12.0–15.0)
Immature Granulocytes: 0 %
Lymphocytes Relative: 14 %
Lymphs Abs: 1.1 10*3/uL (ref 0.7–4.0)
MCH: 31.4 pg (ref 26.0–34.0)
MCHC: 33.5 g/dL (ref 30.0–36.0)
MCV: 93.9 fL (ref 80.0–100.0)
Monocytes Absolute: 0.5 10*3/uL (ref 0.1–1.0)
Monocytes Relative: 7 %
Neutro Abs: 5.9 10*3/uL (ref 1.7–7.7)
Neutrophils Relative %: 78 %
Platelets: 327 10*3/uL (ref 150–400)
RBC: 4.23 MIL/uL (ref 3.87–5.11)
RDW: 15.6 % — ABNORMAL HIGH (ref 11.5–15.5)
WBC: 7.6 10*3/uL (ref 4.0–10.5)
nRBC: 0 % (ref 0.0–0.2)

## 2020-09-11 LAB — LIPASE, BLOOD: Lipase: 27 U/L (ref 11–51)

## 2020-09-11 MED ORDER — MORPHINE SULFATE (PF) 4 MG/ML IV SOLN
INTRAVENOUS | Status: AC
  Administered 2020-09-11: 4 mg via INTRAVENOUS
  Filled 2020-09-11: qty 1

## 2020-09-11 MED ORDER — ONDANSETRON 4 MG PO TBDP: 4.0000 mg | ORAL_TABLET | Freq: Three times a day (TID) | ORAL | 0 refills | Status: DC | PRN

## 2020-09-11 MED ORDER — SODIUM CHLORIDE 0.9 % IV BOLUS
1000.0000 mL | Freq: Once | INTRAVENOUS | Status: AC
  Administered 2020-09-11: 1000 mL via INTRAVENOUS

## 2020-09-11 MED ORDER — ONDANSETRON HCL 4 MG/2ML IJ SOLN
4.0000 mg | Freq: Once | INTRAMUSCULAR | Status: AC
  Administered 2020-09-11: 4 mg via INTRAVENOUS
  Filled 2020-09-11: qty 2

## 2020-09-11 MED ORDER — MORPHINE SULFATE (PF) 4 MG/ML IV SOLN
4.0000 mg | Freq: Once | INTRAVENOUS | Status: AC
  Filled 2020-09-11: qty 1

## 2020-09-11 MED ORDER — OXYCODONE-ACETAMINOPHEN 5-325 MG PO TABS: 1.0000 | ORAL_TABLET | Freq: Four times a day (QID) | ORAL | 0 refills | Status: DC | PRN

## 2020-09-11 MED ORDER — IOHEXOL 300 MG/ML  SOLN
100.0000 mL | Freq: Once | INTRAMUSCULAR | Status: AC | PRN
  Administered 2020-09-11: 100 mL via INTRAVENOUS

## 2020-09-11 MED ORDER — OXYCODONE-ACETAMINOPHEN 5-325 MG PO TABS: 1.0000 | ORAL_TABLET | Freq: Four times a day (QID) | ORAL | 0 refills | Status: AC | PRN

## 2020-09-11 MED ORDER — OXYCODONE-ACETAMINOPHEN 5-325 MG PO TABS
1.0000 | ORAL_TABLET | Freq: Once | ORAL | Status: AC
  Administered 2020-09-11: 1 via ORAL
  Filled 2020-09-11: qty 1

## 2020-09-11 NOTE — ED Triage Notes (Signed)
Pt c/o abd pain that started on this past Friday. Pt was coming and going with cramps, N/V. Pt states that she had a telehelath appointment today and was not having any pain. Pt was Rx protonix to take for the next few week and have a follow up. Pt states that at approx 2200 she tried to take an oral zofran and vomited it back up. Pt aaox3, ambulatory with steady gait, VSS, GCS 15.

## 2020-09-11 NOTE — ED Provider Notes (Signed)
[facility] Provider Note   CSN: [id number] Arrival date & time: 09/10/20  2354     History Chief Complaint  Patient presents with  . Abdominal Pain    Kristy Sherman is a 70 y.o. female.  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  Patient reports onset of symptoms on Saturday.  She states her pain comes and goes.  It is mostly epigastric.  She has had multiple episodes of nonbilious, nonbloody emesis.  She reports that she has not had any change in her bowels but continues to take MiraLAX daily.  She reports a sensation of feeling full.  She reports that she saw her primary physician for a follow-up visit today but was not having significant pain at that time.  Pain recurred this evening and is now 6 out of 10.  Nothing seems to make it better or worse.  She has not taken anything for her pain.  Past Medical History:  Diagnosis Date  . ANKLE PAIN, LEFT 10/17/2007   Qualifier: Diagnosis of  By: Jerold Coombe    . Hyperlipidemia   . Hypertension   . Obesity   . Sjoegren syndrome     Patient Active Problem List   Diagnosis Date Noted  . Dyspareunia in female 11/11/2018  . Atrophic vaginitis 11/11/2018  . Acute perforated appendicitis 11/11/2018  . Acute appendicitis with perforation and localized peritonitis 11/11/2018  . Sjogren's syndrome ([place])   . Obesity   . Diverticulosis of colon  11/10/2012  . Hyperlipidemia 02/24/2007  . Essential hypertension 02/24/2007    Past Surgical History:  Procedure Laterality Date  . [place]  . LAPAROSCOPIC APPENDECTOMY N/A 11/11/2018   Procedure: APPENDECTOMY LAPAROSCOPIC;  Surgeon: Michael Boston, MD;  Location: [facility];  Service: General;  Laterality: N/A;     OB History    Gravida  3   Para  3   Term       Preterm      AB      Living  3     SAB      IAB      Ectopic      Multiple      Live Births              Family History  Problem Relation Age of Onset  . Colon cancer Maternal Grandmother 32  . Asthma Other   . Hypertension Other     Social History   Tobacco Use  . Smoking status: Never Smoker  . Smokeless tobacco: Never Used  Substance Use Topics  . Alcohol use: Yes    Alcohol/week: 2.0 standard drinks    Types: 2 Glasses of wine per week  . Drug use: No    Home Medications Prior to Admission medications   Medication Sig Start Date End Date Taking? Authorizing Provider  ondansetron (ZOFRAN ODT) 4 MG disintegrating tablet Take 1 tablet (4 mg total) by mouth every 8 (eight) hours as needed. 09/11/20  Yes Hannahgrace Lalli, Barbette Hair, MD  oxyCODONE-acetaminophen (PERCOCET/ROXICET) 5-325 MG tablet Take 1 tablet by mouth every 6 (six) hours as needed for severe pain. 09/11/20  Yes Emilyanne Mcgough, Barbette Hair, MD  amLODipine (NORVASC) 10 MG tablet Take 10 mg by mouth daily.    [provider]  amoxicillin-clavulanate (AUGMENTIN) 875-125 MG tablet  Take 1 tablet by mouth 2 (two) times daily. 11/12/18   Michael Boston, MD  Aspirin (BAYER LOW DOSE PO) Take by mouth.    [provider]  atorvastatin (LIPITOR) 10 MG tablet Take 10 mg by mouth daily. 10/24/18   [provider]  hydrochlorothiazide (HYDRODIURIL) 25 MG tablet Take 25 mg by mouth daily.    [provider]  lidocaine (LIDODERM) 5 % Place 1 patch onto the skin daily. Remove & Discard patch within 12 hours or as directed by MD 02/14/19   Ward, Ozella Almond, PA-C  traMADol (ULTRAM) 50 MG tablet Take 1-2 tablets (50-100 mg total) by mouth every 6 (six) hours as needed for moderate pain or severe pain. 11/12/18   Michael Boston, MD    Allergies    Patient has no known allergies.  Review of Systems   Review of Systems  Constitutional: Negative for fever.  Respiratory: Negative for shortness of breath.    Cardiovascular: Negative for chest pain.  Gastrointestinal: Positive for abdominal pain, nausea and vomiting.  Genitourinary: Negative for dysuria.  All other systems reviewed and are negative.   Physical Exam Updated Vital Signs BP 130/87   Pulse 89   Temp 99.2 F (37.3 C) (Oral)   Resp 17   Ht 1.651 m (5\' 5" )   Wt 81.2 kg   SpO2 99%   BMI 29.79 kg/m   Physical Exam Vitals and nursing note reviewed.  Constitutional:      Appearance: She is well-developed and well-nourished. She is not ill-appearing.  HENT:     Head: Normocephalic and atraumatic.     Mouth/Throat:     Mouth: Mucous membranes are moist.  Eyes:     Pupils: Pupils are equal, round, and reactive to light.  Cardiovascular:     Rate and Rhythm: Normal rate and regular rhythm.     Heart sounds: Normal heart sounds.  Pulmonary:     Effort: Pulmonary effort is normal. No respiratory distress.     Breath sounds: No wheezing.  Abdominal:     General: Bowel sounds are normal.     Palpations: Abdomen is soft.     Tenderness: There is abdominal tenderness.     Comments: Extensive abdominal scarring, well-healed, mild epigastric tenderness to palpation, no rebound or guarding  Musculoskeletal:     Cervical back: Neck supple.  Skin:    General: Skin is warm and dry.  Neurological:     Mental Status: She is alert and oriented to person, place, and time.  Psychiatric:        Mood and Affect: Mood and affect and mood normal.     ED Results / Procedures / Treatments   Labs (all labs ordered are listed, but only abnormal results are displayed) Labs Reviewed  CBC WITH DIFFERENTIAL/PLATELET - Abnormal; Notable for the following components:      Result Value   RDW 15.6 (*)    All other components within normal limits  COMPREHENSIVE METABOLIC PANEL - Abnormal; Notable for the following components:   Glucose, Bld 129 (*)    Creatinine, Ser 1.06 (*)    Total Protein 8.3 (*)    GFR, Estimated 58 (*)    All other  components within normal limits  URINALYSIS, ROUTINE W REFLEX MICROSCOPIC - Abnormal; Notable for the following components:   APPearance HAZY (*)    All other components within normal limits  LIPASE, BLOOD    EKG None  Radiology CT ABDOMEN PELVIS W CONTRAST  Result Date: 09/11/2020 CLINICAL DATA:  Nausea, vomiting, abdominal pain EXAM: CT ABDOMEN AND PELVIS WITH CONTRAST TECHNIQUE: Multidetector CT imaging of the abdomen and pelvis was performed using the standard protocol following bolus administration of intravenous contrast. CONTRAST:  174mL OMNIPAQUE IOHEXOL 300 MG/ML  SOLN COMPARISON:  11/11/2018 FINDINGS: Lower chest: The visualized heart and pericardium are unremarkable. The visualized lung bases are clear. Hepatobiliary: Since the prior examination, there has developed mild to moderate intra and extrahepatic biliary ductal dilation. The gallbladder is not clearly identified and there is fecalized contents within the gallbladder fossa. While this may simply represent: Migrating into this space following cholecystectomy, if there has been no history of interval surgery, this may represent a fistula between the gallbladder lumen and adjacent colon. Within the liver, a a hypoattenuating lesion is seen measuring 1.8 x 3.2 cm in within the right hepatic lobe on axial image # 14/2. No other intrahepatic lesions identified. Pancreas: Unremarkable Spleen: Interval splenectomy. Adrenals/Urinary Tract: The adrenal glands, kidneys, and bladder are unremarkable. Stomach/Bowel: Appendectomy has been performed. Moderate stool is seen throughout the colon. As noted above, there is fecalized contents within the gallbladder fossa and it is unclear whether this represents fistulization between the gallbladder and right: (Axial image # 29/2, or herniation of bowel into a defect related to interval cholecystectomy. There is no evidence of obstruction or focal inflammation. There has developed loculated ascites within  the left upper quadrant as well as peritoneal thickening diffusely within the abdomen suspicious for malignant ascites or, given the history of appendectomy, pseudomyxoma peritonei in the setting of an appendiceal neoplasm. Alternatively, this may simply represent postsurgical fluid in the recent postsurgical state. No free intraperitoneal gas. Vascular/Lymphatic: The common iliac arteries appear obliterated centrally at the confluence of the inferior vena cava possibly representing focal chronic thrombosis. The abdominal aorta demonstrates mild atherosclerotic calcification. No aortic aneurysm. No pathologic adenopathy within the abdomen and pelvis. Reproductive: Status post hysterectomy. No adnexal masses. Other: Laparotomy incision scar noted.  No abdominal wall hernia. Musculoskeletal: No acute bone abnormality. IMPRESSION: Complex examination.  Interval splenectomy. Development of loculated ascites and diffuse peritoneal thickening. Differential considerations include pseudomyxoma peritonei in the setting of an appendiceal neoplasm, malignant ascites, or postsurgical change in the recent postsurgical state. Nonvisualization of the gallbladder with fecalized content within the gallbladder fossa. In absence of a history of a cholecystectomy, this may represent a fistula between the right colon and gallbladder lumen. Alternatively, this may simply represent herniation of bowel into the surgical defect/gallbladder fossa. Correlation with surgical history would be helpful in differentiating these entities. Interval development of a moderate intra and extrahepatic biliary ductal dilation. Correlation with liver enzymes would be helpful to assess for a distal obstructing lesion. In the setting of a colonic fistula, this may relate to debris/inflammatory change within the biliary tree. Indeterminate 3.2 cm low-attenuation lesion within the right hepatic lobe. This could be better assessed with MRI examination.  Electronically Signed   By: Fidela Salisbury MD   On: 09/11/2020 02:45    Procedures Procedures   Medications Ordered in ED Medications  sodium chloride 0.9 % bolus 1,000 mL (0 mLs Intravenous Stopped 09/11/20 0227)  ondansetron (ZOFRAN) injection 4 mg (4 mg Intravenous Given 09/11/20 0110)  morphine 4 MG/ML injection 4 mg (4 mg Intravenous Given 09/11/20 0108)  iohexol (OMNIPAQUE) 300 MG/ML solution 100 mL (100 mLs Intravenous Contrast Given 09/11/20 0147)    ED Course  I have reviewed the triage vital signs and the nursing notes.  Pertinent labs & imaging results that were available during my care of the patient were reviewed by me and considered in my medical decision making (see chart for details).  Clinical Course as of 09/11/20 0347  Wed Sep 11, 2020  0305 Spoke with radiologist regarding CT read.  Gave him additional history regarding her prior operative ex lap with extensive debulking and Intra-Op chemotherapy.  Given this information, he feels that her CT scan does not have any acute emergent issues. [CH]    Clinical Course User Index [CH] Amel Gianino, Barbette Hair, MD   MDM Rules/Calculators/A&P                          Patient presents with abdominal pain and vomiting.  She is overall nontoxic.  Vital signs are reassuring.  She has some mild tenderness on exam.  She has extensive surgical history with a shake and bake last August with intraperitoneal chemotherapy and multiple resections including splenectomy, cholecystectomy, bilateral oophorectomy.  Given this history, she is certainly at risk for SBO.  Labs obtained.  No significant leukocytosis.  Slight elevation of creatinine may be related to dehydration.  Patient was given fluids.  CT scan obtained and reviewed.  She has multiple abnormalities.  I have reviewed her prior CT scan reads.  Highly suspect that many of these are related to her surgery and intraperitoneal chemotherapy.  I did speak to radiology who agrees.  Patient improved  after pain and nausea medication.  She has close follow-up with her surgical oncologist.  There is no evidence of SBO or other emergent process.  Will discharge with pain medication and Zofran.  After history, exam, and medical workup I feel the patient has been appropriately medically screened and is safe for discharge home. Pertinent diagnoses were discussed with the patient. Patient was given return precautions.  Final Clinical Impression(s) / ED Diagnoses Final diagnoses:  Non-intractable vomiting with nausea, unspecified vomiting type  Epigastric pain    Rx / DC Orders ED Discharge Orders         Ordered    ondansetron (ZOFRAN ODT) 4 MG disintegrating tablet  Every 8 hours PRN        09/11/20 0346    oxyCODONE-acetaminophen (PERCOCET/ROXICET) 5-325 MG tablet  Every 6 hours PRN        09/11/20 0346           Dantre Yearwood, Barbette Hair, MD 09/11/20 [phone]

## 2020-09-11 NOTE — Discharge Instructions (Addendum)
You were seen today for abdominal pain and vomiting.  Work-up is largely reassuring.  You had a CT scan that showed significant changes but this is likely related to your known operative procedure.  Follow-up closely with your surgical oncologist

## 2020-09-11 NOTE — ED Notes (Signed)
Returned from CT.

## 2020-09-11 NOTE — ED Notes (Signed)
Pt given apple juice per po oral challenge

## 2020-09-13 ENCOUNTER — Emergency Department (HOSPITAL_BASED_OUTPATIENT_CLINIC_OR_DEPARTMENT_OTHER)
Admission: EM | Admit: 2020-09-13 | Discharge: 2020-09-13 | Disposition: A | Discharge status: Home / Self Care | Payer: PPO | Attending: Emergency Medicine | Admitting: Emergency Medicine

## 2020-09-13 ENCOUNTER — Other Ambulatory Visit: Payer: Self-pay

## 2020-09-13 ENCOUNTER — Encounter (HOSPITAL_BASED_OUTPATIENT_CLINIC_OR_DEPARTMENT_OTHER): Payer: Self-pay | Admitting: Emergency Medicine

## 2020-09-13 DIAGNOSIS — R079 Chest pain, unspecified: Secondary | ICD-10-CM | POA: Diagnosis not present

## 2020-09-13 DIAGNOSIS — Z4659 Encounter for fitting and adjustment of other gastrointestinal appliance and device: Secondary | ICD-10-CM | POA: Diagnosis not present

## 2020-09-13 DIAGNOSIS — N179 Acute kidney failure, unspecified: Secondary | ICD-10-CM | POA: Diagnosis not present

## 2020-09-13 DIAGNOSIS — I272 Pulmonary hypertension, unspecified: Secondary | ICD-10-CM | POA: Diagnosis not present

## 2020-09-13 DIAGNOSIS — Z6836 Body mass index (BMI) 36.0-36.9, adult: Secondary | ICD-10-CM | POA: Diagnosis not present

## 2020-09-13 DIAGNOSIS — K565 Intestinal adhesions [bands], unspecified as to partial versus complete obstruction: Secondary | ICD-10-CM | POA: Diagnosis not present

## 2020-09-13 DIAGNOSIS — R112 Nausea with vomiting, unspecified: Secondary | ICD-10-CM

## 2020-09-13 DIAGNOSIS — K632 Fistula of intestine: Secondary | ICD-10-CM | POA: Diagnosis not present

## 2020-09-13 DIAGNOSIS — Z6832 Body mass index (BMI) 32.0-32.9, adult: Secondary | ICD-10-CM | POA: Diagnosis not present

## 2020-09-13 DIAGNOSIS — R1084 Generalized abdominal pain: Secondary | ICD-10-CM

## 2020-09-13 DIAGNOSIS — R578 Other shock: Secondary | ICD-10-CM | POA: Diagnosis not present

## 2020-09-13 DIAGNOSIS — Z4801 Encounter for change or removal of surgical wound dressing: Secondary | ICD-10-CM | POA: Diagnosis not present

## 2020-09-13 DIAGNOSIS — R0600 Dyspnea, unspecified: Secondary | ICD-10-CM | POA: Diagnosis not present

## 2020-09-13 DIAGNOSIS — R110 Nausea: Secondary | ICD-10-CM | POA: Diagnosis not present

## 2020-09-13 DIAGNOSIS — Z7982 Long term (current) use of aspirin: Secondary | ICD-10-CM | POA: Diagnosis not present

## 2020-09-13 DIAGNOSIS — K5652 Intestinal adhesions [bands] with complete obstruction: Secondary | ICD-10-CM | POA: Diagnosis not present

## 2020-09-13 DIAGNOSIS — I4891 Unspecified atrial fibrillation: Secondary | ICD-10-CM | POA: Diagnosis not present

## 2020-09-13 DIAGNOSIS — R638 Other symptoms and signs concerning food and fluid intake: Secondary | ICD-10-CM | POA: Diagnosis not present

## 2020-09-13 DIAGNOSIS — R188 Other ascites: Secondary | ICD-10-CM | POA: Diagnosis not present

## 2020-09-13 DIAGNOSIS — K56609 Unspecified intestinal obstruction, unspecified as to partial versus complete obstruction: Secondary | ICD-10-CM | POA: Diagnosis not present

## 2020-09-13 DIAGNOSIS — Z8589 Personal history of malignant neoplasm of other organs and systems: Secondary | ICD-10-CM | POA: Diagnosis not present

## 2020-09-13 DIAGNOSIS — I482 Chronic atrial fibrillation, unspecified: Secondary | ICD-10-CM | POA: Diagnosis not present

## 2020-09-13 DIAGNOSIS — E46 Unspecified protein-calorie malnutrition: Secondary | ICD-10-CM | POA: Diagnosis not present

## 2020-09-13 DIAGNOSIS — I10 Essential (primary) hypertension: Secondary | ICD-10-CM | POA: Diagnosis not present

## 2020-09-13 DIAGNOSIS — R109 Unspecified abdominal pain: Secondary | ICD-10-CM | POA: Diagnosis not present

## 2020-09-13 DIAGNOSIS — K5669 Other partial intestinal obstruction: Secondary | ICD-10-CM | POA: Diagnosis not present

## 2020-09-13 DIAGNOSIS — E785 Hyperlipidemia, unspecified: Secondary | ICD-10-CM | POA: Diagnosis not present

## 2020-09-13 DIAGNOSIS — I452 Bifascicular block: Secondary | ICD-10-CM | POA: Diagnosis not present

## 2020-09-13 DIAGNOSIS — I959 Hypotension, unspecified: Secondary | ICD-10-CM | POA: Diagnosis not present

## 2020-09-13 DIAGNOSIS — E7800 Pure hypercholesterolemia, unspecified: Secondary | ICD-10-CM | POA: Diagnosis not present

## 2020-09-13 DIAGNOSIS — Z79899 Other long term (current) drug therapy: Secondary | ICD-10-CM | POA: Diagnosis not present

## 2020-09-13 DIAGNOSIS — G893 Neoplasm related pain (acute) (chronic): Secondary | ICD-10-CM | POA: Diagnosis not present

## 2020-09-13 DIAGNOSIS — R6510 Systemic inflammatory response syndrome (SIRS) of non-infectious origin without acute organ dysfunction: Secondary | ICD-10-CM | POA: Diagnosis not present

## 2020-09-13 DIAGNOSIS — M3500 Sicca syndrome, unspecified: Secondary | ICD-10-CM | POA: Diagnosis not present

## 2020-09-13 DIAGNOSIS — I4510 Unspecified right bundle-branch block: Secondary | ICD-10-CM | POA: Diagnosis not present

## 2020-09-13 DIAGNOSIS — I491 Atrial premature depolarization: Secondary | ICD-10-CM | POA: Diagnosis not present

## 2020-09-13 DIAGNOSIS — Z452 Encounter for adjustment and management of vascular access device: Secondary | ICD-10-CM | POA: Diagnosis not present

## 2020-09-13 DIAGNOSIS — J90 Pleural effusion, not elsewhere classified: Secondary | ICD-10-CM | POA: Diagnosis not present

## 2020-09-13 DIAGNOSIS — R0602 Shortness of breath: Secondary | ICD-10-CM | POA: Diagnosis not present

## 2020-09-13 DIAGNOSIS — Z432 Encounter for attention to ileostomy: Secondary | ICD-10-CM | POA: Diagnosis not present

## 2020-09-13 DIAGNOSIS — Z48815 Encounter for surgical aftercare following surgery on the digestive system: Secondary | ICD-10-CM | POA: Diagnosis not present

## 2020-09-13 DIAGNOSIS — Z800 Family history of malignant neoplasm of digestive organs: Secondary | ICD-10-CM | POA: Diagnosis not present

## 2020-09-13 DIAGNOSIS — E669 Obesity, unspecified: Secondary | ICD-10-CM | POA: Diagnosis not present

## 2020-09-13 DIAGNOSIS — K56600 Partial intestinal obstruction, unspecified as to cause: Secondary | ICD-10-CM | POA: Diagnosis not present

## 2020-09-13 DIAGNOSIS — R739 Hyperglycemia, unspecified: Secondary | ICD-10-CM | POA: Diagnosis not present

## 2020-09-13 DIAGNOSIS — F5089 Other specified eating disorder: Secondary | ICD-10-CM | POA: Diagnosis not present

## 2020-09-13 DIAGNOSIS — Z8249 Family history of ischemic heart disease and other diseases of the circulatory system: Secondary | ICD-10-CM | POA: Diagnosis not present

## 2020-09-13 DIAGNOSIS — R000 Tachycardia, unspecified: Secondary | ICD-10-CM | POA: Diagnosis not present

## 2020-09-13 DIAGNOSIS — Z9049 Acquired absence of other specified parts of digestive tract: Secondary | ICD-10-CM | POA: Diagnosis not present

## 2020-09-13 DIAGNOSIS — R630 Anorexia: Secondary | ICD-10-CM | POA: Diagnosis not present

## 2020-09-13 DIAGNOSIS — K660 Peritoneal adhesions (postprocedural) (postinfection): Secondary | ICD-10-CM | POA: Diagnosis not present

## 2020-09-13 DIAGNOSIS — R579 Shock, unspecified: Secondary | ICD-10-CM | POA: Diagnosis not present

## 2020-09-13 DIAGNOSIS — Z7189 Other specified counseling: Secondary | ICD-10-CM | POA: Diagnosis not present

## 2020-09-13 DIAGNOSIS — C181 Malignant neoplasm of appendix: Secondary | ICD-10-CM | POA: Diagnosis not present

## 2020-09-13 DIAGNOSIS — K5651 Intestinal adhesions [bands], with partial obstruction: Secondary | ICD-10-CM | POA: Diagnosis not present

## 2020-09-13 DIAGNOSIS — Z87891 Personal history of nicotine dependence: Secondary | ICD-10-CM | POA: Diagnosis not present

## 2020-09-13 DIAGNOSIS — Z515 Encounter for palliative care: Secondary | ICD-10-CM | POA: Diagnosis not present

## 2020-09-13 DIAGNOSIS — F064 Anxiety disorder due to known physiological condition: Secondary | ICD-10-CM | POA: Diagnosis not present

## 2020-09-13 DIAGNOSIS — N289 Disorder of kidney and ureter, unspecified: Secondary | ICD-10-CM | POA: Diagnosis not present

## 2020-09-13 LAB — CBC WITH DIFFERENTIAL/PLATELET
Abs Immature Granulocytes: 0.02 10*3/uL (ref 0.00–0.07)
Basophils Absolute: 0 10*3/uL (ref 0.0–0.1)
Basophils Relative: 0 %
Eosinophils Absolute: 0 10*3/uL (ref 0.0–0.5)
Eosinophils Relative: 0 %
HCT: 38.5 % (ref 36.0–46.0)
Hemoglobin: 12.7 g/dL (ref 12.0–15.0)
Immature Granulocytes: 0 %
Lymphocytes Relative: 10 %
Lymphs Abs: 0.8 10*3/uL (ref 0.7–4.0)
MCH: 31.2 pg (ref 26.0–34.0)
MCHC: 33 g/dL (ref 30.0–36.0)
MCV: 94.6 fL (ref 80.0–100.0)
Monocytes Absolute: 0.9 10*3/uL (ref 0.1–1.0)
Monocytes Relative: 12 %
Neutro Abs: 5.9 10*3/uL (ref 1.7–7.7)
Neutrophils Relative %: 78 %
Platelets: 320 10*3/uL (ref 150–400)
RBC: 4.07 MIL/uL (ref 3.87–5.11)
RDW: 15.5 % (ref 11.5–15.5)
WBC: 7.6 10*3/uL (ref 4.0–10.5)
nRBC: 0 % (ref 0.0–0.2)

## 2020-09-13 LAB — COMPREHENSIVE METABOLIC PANEL
ALT: 40 U/L (ref 0–44)
AST: 35 U/L (ref 15–41)
Albumin: 4.1 g/dL (ref 3.5–5.0)
Alkaline Phosphatase: 98 U/L (ref 38–126)
Anion gap: 12 (ref 5–15)
BUN: 23 mg/dL (ref 8–23)
CO2: 25 mmol/L (ref 22–32)
Calcium: 10.1 mg/dL (ref 8.9–10.3)
Chloride: 102 mmol/L (ref 98–111)
Creatinine, Ser: 1.07 mg/dL — ABNORMAL HIGH (ref 0.44–1.00)
GFR, Estimated: 57 mL/min — ABNORMAL LOW (ref >60)
Glucose, Bld: 133 mg/dL — ABNORMAL HIGH (ref 70–99)
Potassium: 3.8 mmol/L (ref 3.5–5.1)
Sodium: 139 mmol/L (ref 135–145)
Total Bilirubin: 0.6 mg/dL (ref 0.3–1.2)
Total Protein: 7.8 g/dL (ref 6.5–8.1)

## 2020-09-13 LAB — LACTIC ACID, PLASMA: Lactic Acid, Venous: 1.2 mmol/L (ref 0.5–1.9)

## 2020-09-13 LAB — LIPASE, BLOOD: Lipase: 26 U/L (ref 11–51)

## 2020-09-13 MED ORDER — PROMETHAZINE HCL 25 MG RE SUPP: 25.0000 mg | Freq: Four times a day (QID) | RECTAL | 0 refills | Status: AC | PRN

## 2020-09-13 MED ORDER — ONDANSETRON HCL 4 MG/2ML IJ SOLN
4.0000 mg | Freq: Once | INTRAMUSCULAR | Status: AC
  Administered 2020-09-13: 4 mg via INTRAVENOUS
  Filled 2020-09-13: qty 2

## 2020-09-13 MED ORDER — HYDROMORPHONE HCL 1 MG/ML IJ SOLN
1.0000 mg | Freq: Once | INTRAMUSCULAR | Status: AC
  Administered 2020-09-13: 1 mg via INTRAVENOUS
  Filled 2020-09-13: qty 1

## 2020-09-13 MED ORDER — HYOSCYAMINE SULFATE 0.125 MG SL SUBL: 0.1250 mg | SUBLINGUAL_TABLET | SUBLINGUAL | 0 refills | Status: AC | PRN

## 2020-09-13 MED ORDER — SODIUM CHLORIDE 0.9 % IV BOLUS
500.0000 mL | Freq: Once | INTRAVENOUS | Status: AC
  Administered 2020-09-13: 500 mL via INTRAVENOUS

## 2020-09-13 MED ORDER — SUCRALFATE 1 GM/10ML PO SUSP: 1.0000 g | Freq: Three times a day (TID) | ORAL | 0 refills | Status: AC

## 2020-09-13 MED ORDER — ONDANSETRON 4 MG PO TBDP: 4.0000 mg | ORAL_TABLET | Freq: Three times a day (TID) | ORAL | 0 refills | Status: AC | PRN

## 2020-09-13 NOTE — ED Triage Notes (Signed)
Patient presents with complaints of abd pain and NV; seen here 3/2 for same.

## 2020-09-13 NOTE — ED Provider Notes (Signed)
[facility] Provider Note   CSN: [id number] Arrival date & time: 09/13/20  0450     History Chief Complaint  Patient presents with  . Abdominal Pain  . Emesis    Kristy Sherman is a 70 y.o. female.  Patient presents to the emergency department for evaluation of abdominal pain.  Patient has been experiencing intermittent sharp and stabbing, diffuse abdominal pains over the last several days.  She was seen in this department 2 days ago for same.  Pain worsened tonight.  No fever.  She has had vomiting, no diarrhea.        Past Medical History:  Diagnosis Date  . ANKLE PAIN, LEFT 10/17/2007   Qualifier: Diagnosis of  By: Jerold Coombe    . Hyperlipidemia   . Hypertension   . Obesity   . Sjoegren syndrome     Patient Active Problem List   Diagnosis Date Noted  . Dyspareunia in female 11/11/2018  . Atrophic vaginitis 11/11/2018  . Acute perforated appendicitis 11/11/2018  . Acute appendicitis with perforation and localized peritonitis 11/11/2018  . Sjogren's syndrome ([place])   . Obesity   . Diverticulosis of colon  11/10/2012  . Hyperlipidemia 02/24/2007  . Essential hypertension 02/24/2007    Past Surgical History:  Procedure Laterality Date  . ABDOMINAL HYSTERECTOMY    . ABDOMINAL SURGERY    . [place]  . CHOLECYSTECTOMY    . LAPAROSCOPIC APPENDECTOMY N/A 11/11/2018   Procedure: APPENDECTOMY LAPAROSCOPIC;  Surgeon: Michael Boston, MD;  Location: [facility];  Service: General;  Laterality: N/A;  . SPLENECTOMY, TOTAL       OB History    Gravida  3   Para  3   Term      Preterm      AB      Living  3     SAB      IAB      Ectopic      Multiple      Live Births              Family History  Problem Relation Age of Onset  . Colon cancer Maternal Grandmother 40  . Asthma Other   . Hypertension Other     Social History   Tobacco Use  . Smoking status: Never Smoker  . Smokeless  tobacco: Never Used  Substance Use Topics  . Alcohol use: Yes    Alcohol/week: 2.0 standard drinks    Types: 2 Glasses of wine per week  . Drug use: No    Home Medications Prior to Admission medications   Medication Sig Start Date End Date Taking? Authorizing Provider  hyoscyamine (LEVSIN SL) 0.125 MG SL tablet Place 1 tablet (0.125 mg total) under the tongue every 4 (four) hours as needed for cramping (abdominal pain). 09/13/20  Yes Elroy Schembri, Gwenyth Allegra, MD  promethazine (PHENERGAN) 25 MG suppository Place 1 suppository (25 mg total) rectally every 6 (six) hours as needed for nausea or vomiting. 09/13/20  Yes Jessamyn Watterson, Gwenyth Allegra, MD  sucralfate (CARAFATE) 1 GM/10ML suspension Take 10 mLs (1 g total) by mouth 4 (four) times daily -  with meals and at bedtime. 09/13/20  Yes Alekzander Cardell, Gwenyth Allegra, MD  amLODipine (NORVASC) 10 MG tablet Take 10 mg by mouth daily.    [provider]  Aspirin (BAYER LOW DOSE PO) Take by mouth.    [provider]  atorvastatin (LIPITOR) 10 MG tablet Take 10  mg by mouth daily. 10/24/18   [provider]  hydrochlorothiazide (HYDRODIURIL) 25 MG tablet Take 25 mg by mouth daily.    [provider]  lidocaine (LIDODERM) 5 % Place 1 patch onto the skin daily. Remove & Discard patch within 12 hours or as directed by MD 02/14/19   Ward, Ozella Almond, PA-C  ondansetron (ZOFRAN ODT) 4 MG disintegrating tablet Take 1 tablet (4 mg total) by mouth every 8 (eight) hours as needed. 09/13/20   Orpah Greek, MD  oxyCODONE-acetaminophen (PERCOCET/ROXICET) 5-325 MG tablet Take 1 tablet by mouth every 6 (six) hours as needed for severe pain. 09/11/20   Horton, Barbette Hair, MD  traMADol (ULTRAM) 50 MG tablet Take 1-2 tablets (50-100 mg total) by mouth every 6 (six) hours as needed for moderate pain or severe pain. 11/12/18   Michael Boston, MD    Allergies    Patient has no known allergies.  Review of Systems   Review of Systems   Gastrointestinal: Positive for abdominal pain, nausea and vomiting.  All other systems reviewed and are negative.   Physical Exam Updated Vital Signs BP (!) 128/106 (BP Location: Right Arm)   Pulse (!) 103   Temp 98.7 F (37.1 C) (Oral)   Resp 18   Ht 5\' 5"  (1.651 m)   Wt 81.2 kg   SpO2 99%   BMI 29.79 kg/m   Physical Exam Vitals and nursing note reviewed.  Constitutional:      General: She is not in acute distress.    Appearance: Normal appearance. She is well-developed and well-nourished.  HENT:     Head: Normocephalic and atraumatic.     Right Ear: Hearing normal.     Left Ear: Hearing normal.     Nose: Nose normal.     Mouth/Throat:     Mouth: Oropharynx is clear and moist and mucous membranes are normal.  Eyes:     Extraocular Movements: EOM normal.     Conjunctiva/sclera: Conjunctivae normal.     Pupils: Pupils are equal, round, and reactive to light.  Cardiovascular:     Rate and Rhythm: Regular rhythm.     Heart sounds: S1 normal and S2 normal. No murmur heard. No friction rub. No gallop.   Pulmonary:     Effort: Pulmonary effort is normal. No respiratory distress.     Breath sounds: Normal breath sounds.  Chest:     Chest wall: No tenderness.  Abdominal:     General: Bowel sounds are normal.     Palpations: Abdomen is soft. There is no hepatosplenomegaly.     Tenderness: There is generalized abdominal tenderness. There is no guarding or rebound. Negative signs include Lumsden's sign and McBurney's sign.     Hernia: No hernia is present.  Musculoskeletal:        General: Normal range of motion.     Cervical back: Normal range of motion and neck supple.  Skin:    General: Skin is warm, dry and intact.     Findings: No rash.     Nails: There is no cyanosis.  Neurological:     Mental Status: She is alert and oriented to person, place, and time.     GCS: GCS eye subscore is 4. GCS verbal subscore is 5. GCS motor subscore is 6.     Cranial Nerves: No  cranial nerve deficit.     Sensory: No sensory deficit.     Coordination: Coordination normal.     Deep Tendon  Reflexes: Strength normal.  Psychiatric:        Mood and Affect: Mood and affect normal.        Speech: Speech normal.        Behavior: Behavior normal.        Thought Content: Thought content normal.     ED Results / Procedures / Treatments   Labs (all labs ordered are listed, but only abnormal results are displayed) Labs Reviewed  COMPREHENSIVE METABOLIC PANEL - Abnormal; Notable for the following components:      Result Value   Glucose, Bld 133 (*)    Creatinine, Ser 1.07 (*)    GFR, Estimated 57 (*)    All other components within normal limits  CBC WITH DIFFERENTIAL/PLATELET  LACTIC ACID, PLASMA  LIPASE, BLOOD  URINALYSIS, ROUTINE W REFLEX MICROSCOPIC    EKG None  Radiology No results found.  Procedures Procedures   Medications Ordered in ED Medications  HYDROmorphone (DILAUDID) injection 1 mg (has no administration in time range)  sodium chloride 0.9 % bolus 500 mL (0 mLs Intravenous Stopped 09/13/20 0633)  HYDROmorphone (DILAUDID) injection 1 mg (1 mg Intravenous Given 09/13/20 0528)  ondansetron (ZOFRAN) injection 4 mg (4 mg Intravenous Given 09/13/20 0531)    ED Course  I have reviewed the triage vital signs and the nursing notes.  Pertinent labs & imaging results that were available during my care of the patient were reviewed by me and considered in my medical decision making (see chart for details).    MDM Rules/Calculators/A&P                          Patient with previous history of carcinomatosis secondary to appendix with extensive surgical resection last year.  She presents with abdominal pain.  Patient seen in the emergency department 2 days ago.  Lab work at that time was reassuring.  CT scan was performed that did not show any obvious small bowel obstruction or other acute pathology.  Since going home, however, patient has had recurrence of  her pain, nausea and vomiting.  Abdominal exam reveals diffuse mild tenderness, no guarding or rebound.  Labs are identical to her labs from the other day.  I do not see utility in redoing the CT scan.  Patient describes globus sensation and increased reflux which might be at play here.  Pain seems to come and go and therefore is somewhat colicky.  She is on Protonix already.  Add Carafate.  Refill Zofran and add Phenergan suppository as needed.  Will try Levsin for the abdominal pain.  She is to call her surgeon at [facility] today for follow-up.  She has 67-month follow-up scheduled on the 16th but I do think she needs to be seen sooner.  If she has recurrent problems requiring an ER visit, she was counseled that she may benefit going to [facility] ER but is welcome back here at any time if needed.  Final Clinical Impression(s) / ED Diagnoses Final diagnoses:  Generalized abdominal pain  Non-intractable vomiting with nausea, unspecified vomiting type    Rx / DC Orders ED Discharge Orders         Ordered    ondansetron (ZOFRAN ODT) 4 MG disintegrating tablet  Every 8 hours PRN        09/13/20 0659    promethazine (PHENERGAN) 25 MG suppository  Every 6 hours PRN        09/13/20 0659    sucralfate (CARAFATE)  1 GM/10ML suspension  3 times daily with meals & bedtime        09/13/20 0659    hyoscyamine (LEVSIN SL) 0.125 MG SL tablet  Every 4 hours PRN        09/13/20 0659           Orpah Greek, MD 09/13/20 [phone]

## 2020-09-14 DIAGNOSIS — I491 Atrial premature depolarization: Secondary | ICD-10-CM | POA: Diagnosis not present

## 2020-09-14 DIAGNOSIS — R112 Nausea with vomiting, unspecified: Secondary | ICD-10-CM | POA: Diagnosis not present

## 2020-09-14 DIAGNOSIS — Z4659 Encounter for fitting and adjustment of other gastrointestinal appliance and device: Secondary | ICD-10-CM | POA: Diagnosis not present

## 2020-09-14 DIAGNOSIS — I452 Bifascicular block: Secondary | ICD-10-CM | POA: Diagnosis not present

## 2020-09-14 DIAGNOSIS — R109 Unspecified abdominal pain: Secondary | ICD-10-CM | POA: Diagnosis not present

## 2020-09-14 DIAGNOSIS — K56600 Partial intestinal obstruction, unspecified as to cause: Secondary | ICD-10-CM | POA: Diagnosis not present

## 2020-09-15 DIAGNOSIS — K56600 Partial intestinal obstruction, unspecified as to cause: Secondary | ICD-10-CM | POA: Diagnosis not present

## 2020-09-16 DIAGNOSIS — K56609 Unspecified intestinal obstruction, unspecified as to partial versus complete obstruction: Secondary | ICD-10-CM | POA: Diagnosis not present

## 2020-09-16 DIAGNOSIS — R638 Other symptoms and signs concerning food and fluid intake: Secondary | ICD-10-CM | POA: Diagnosis not present

## 2020-09-18 DIAGNOSIS — K5669 Other partial intestinal obstruction: Secondary | ICD-10-CM | POA: Diagnosis not present

## 2020-09-18 DIAGNOSIS — K660 Peritoneal adhesions (postprocedural) (postinfection): Secondary | ICD-10-CM | POA: Diagnosis not present

## 2020-09-18 DIAGNOSIS — K565 Intestinal adhesions [bands], unspecified as to partial versus complete obstruction: Secondary | ICD-10-CM | POA: Diagnosis not present

## 2020-09-18 DIAGNOSIS — Z4659 Encounter for fitting and adjustment of other gastrointestinal appliance and device: Secondary | ICD-10-CM | POA: Diagnosis not present

## 2020-09-18 DIAGNOSIS — R1084 Generalized abdominal pain: Secondary | ICD-10-CM | POA: Diagnosis not present

## 2020-09-18 DIAGNOSIS — R188 Other ascites: Secondary | ICD-10-CM | POA: Diagnosis not present

## 2020-09-19 DIAGNOSIS — E7800 Pure hypercholesterolemia, unspecified: Secondary | ICD-10-CM | POA: Diagnosis not present

## 2020-09-19 DIAGNOSIS — R1084 Generalized abdominal pain: Secondary | ICD-10-CM | POA: Diagnosis not present

## 2020-09-19 DIAGNOSIS — I452 Bifascicular block: Secondary | ICD-10-CM | POA: Diagnosis not present

## 2020-09-19 DIAGNOSIS — E46 Unspecified protein-calorie malnutrition: Secondary | ICD-10-CM | POA: Diagnosis not present

## 2020-09-19 DIAGNOSIS — R0602 Shortness of breath: Secondary | ICD-10-CM | POA: Diagnosis not present

## 2020-09-19 DIAGNOSIS — I959 Hypotension, unspecified: Secondary | ICD-10-CM | POA: Diagnosis not present

## 2020-09-19 DIAGNOSIS — N289 Disorder of kidney and ureter, unspecified: Secondary | ICD-10-CM | POA: Diagnosis not present

## 2020-09-19 DIAGNOSIS — R739 Hyperglycemia, unspecified: Secondary | ICD-10-CM | POA: Diagnosis not present

## 2020-09-19 DIAGNOSIS — Z6836 Body mass index (BMI) 36.0-36.9, adult: Secondary | ICD-10-CM | POA: Diagnosis not present

## 2020-09-20 DIAGNOSIS — I959 Hypotension, unspecified: Secondary | ICD-10-CM | POA: Diagnosis not present

## 2020-09-20 DIAGNOSIS — R739 Hyperglycemia, unspecified: Secondary | ICD-10-CM | POA: Diagnosis not present

## 2020-09-20 DIAGNOSIS — N289 Disorder of kidney and ureter, unspecified: Secondary | ICD-10-CM | POA: Diagnosis not present

## 2020-09-20 DIAGNOSIS — E46 Unspecified protein-calorie malnutrition: Secondary | ICD-10-CM | POA: Diagnosis not present

## 2020-09-20 DIAGNOSIS — Z6836 Body mass index (BMI) 36.0-36.9, adult: Secondary | ICD-10-CM | POA: Diagnosis not present

## 2020-09-20 DIAGNOSIS — E7800 Pure hypercholesterolemia, unspecified: Secondary | ICD-10-CM | POA: Diagnosis not present

## 2020-09-21 DIAGNOSIS — I4510 Unspecified right bundle-branch block: Secondary | ICD-10-CM | POA: Diagnosis not present

## 2020-09-21 DIAGNOSIS — R6510 Systemic inflammatory response syndrome (SIRS) of non-infectious origin without acute organ dysfunction: Secondary | ICD-10-CM | POA: Diagnosis not present

## 2020-09-21 DIAGNOSIS — R579 Shock, unspecified: Secondary | ICD-10-CM | POA: Diagnosis not present

## 2020-09-21 DIAGNOSIS — R638 Other symptoms and signs concerning food and fluid intake: Secondary | ICD-10-CM | POA: Diagnosis not present

## 2020-09-21 DIAGNOSIS — I4891 Unspecified atrial fibrillation: Secondary | ICD-10-CM | POA: Diagnosis not present

## 2020-09-22 DIAGNOSIS — R6510 Systemic inflammatory response syndrome (SIRS) of non-infectious origin without acute organ dysfunction: Secondary | ICD-10-CM | POA: Diagnosis not present

## 2020-09-22 DIAGNOSIS — M3500 Sicca syndrome, unspecified: Secondary | ICD-10-CM | POA: Diagnosis not present

## 2020-09-22 DIAGNOSIS — I272 Pulmonary hypertension, unspecified: Secondary | ICD-10-CM | POA: Diagnosis not present

## 2020-09-22 DIAGNOSIS — R579 Shock, unspecified: Secondary | ICD-10-CM | POA: Diagnosis not present

## 2020-09-22 DIAGNOSIS — I482 Chronic atrial fibrillation, unspecified: Secondary | ICD-10-CM | POA: Diagnosis not present

## 2020-09-24 DIAGNOSIS — I4510 Unspecified right bundle-branch block: Secondary | ICD-10-CM | POA: Diagnosis not present

## 2020-09-24 DIAGNOSIS — J90 Pleural effusion, not elsewhere classified: Secondary | ICD-10-CM | POA: Diagnosis not present

## 2020-09-24 DIAGNOSIS — R079 Chest pain, unspecified: Secondary | ICD-10-CM | POA: Diagnosis not present

## 2020-10-02 DIAGNOSIS — J90 Pleural effusion, not elsewhere classified: Secondary | ICD-10-CM | POA: Diagnosis not present

## 2020-10-03 DIAGNOSIS — I4891 Unspecified atrial fibrillation: Secondary | ICD-10-CM | POA: Diagnosis not present

## 2020-10-03 DIAGNOSIS — J90 Pleural effusion, not elsewhere classified: Secondary | ICD-10-CM | POA: Diagnosis not present

## 2020-10-03 DIAGNOSIS — R000 Tachycardia, unspecified: Secondary | ICD-10-CM | POA: Diagnosis not present

## 2020-10-03 DIAGNOSIS — I4510 Unspecified right bundle-branch block: Secondary | ICD-10-CM | POA: Diagnosis not present

## 2020-10-04 DIAGNOSIS — G893 Neoplasm related pain (acute) (chronic): Secondary | ICD-10-CM | POA: Diagnosis not present

## 2020-10-04 DIAGNOSIS — F064 Anxiety disorder due to known physiological condition: Secondary | ICD-10-CM | POA: Diagnosis not present

## 2020-10-04 DIAGNOSIS — E669 Obesity, unspecified: Secondary | ICD-10-CM | POA: Diagnosis not present

## 2020-10-04 DIAGNOSIS — C181 Malignant neoplasm of appendix: Secondary | ICD-10-CM | POA: Diagnosis not present

## 2020-10-04 DIAGNOSIS — M3500 Sicca syndrome, unspecified: Secondary | ICD-10-CM | POA: Diagnosis not present

## 2020-10-04 DIAGNOSIS — F5089 Other specified eating disorder: Secondary | ICD-10-CM | POA: Diagnosis not present

## 2020-10-04 DIAGNOSIS — R0600 Dyspnea, unspecified: Secondary | ICD-10-CM | POA: Diagnosis not present

## 2020-10-04 DIAGNOSIS — Z515 Encounter for palliative care: Secondary | ICD-10-CM | POA: Diagnosis not present

## 2020-10-04 DIAGNOSIS — Z7189 Other specified counseling: Secondary | ICD-10-CM | POA: Diagnosis not present

## 2020-10-04 DIAGNOSIS — I10 Essential (primary) hypertension: Secondary | ICD-10-CM | POA: Diagnosis not present

## 2020-10-06 DIAGNOSIS — R0602 Shortness of breath: Secondary | ICD-10-CM | POA: Diagnosis not present

## 2020-10-07 DIAGNOSIS — G893 Neoplasm related pain (acute) (chronic): Secondary | ICD-10-CM | POA: Diagnosis not present

## 2020-10-07 DIAGNOSIS — Z7189 Other specified counseling: Secondary | ICD-10-CM | POA: Diagnosis not present

## 2020-10-07 DIAGNOSIS — R0600 Dyspnea, unspecified: Secondary | ICD-10-CM | POA: Diagnosis not present

## 2020-10-07 DIAGNOSIS — F064 Anxiety disorder due to known physiological condition: Secondary | ICD-10-CM | POA: Diagnosis not present

## 2020-10-07 DIAGNOSIS — Z515 Encounter for palliative care: Secondary | ICD-10-CM | POA: Diagnosis not present

## 2020-10-07 DIAGNOSIS — M3500 Sicca syndrome, unspecified: Secondary | ICD-10-CM | POA: Diagnosis not present

## 2020-10-07 DIAGNOSIS — C181 Malignant neoplasm of appendix: Secondary | ICD-10-CM | POA: Diagnosis not present

## 2020-10-07 DIAGNOSIS — I10 Essential (primary) hypertension: Secondary | ICD-10-CM | POA: Diagnosis not present

## 2020-10-07 DIAGNOSIS — E669 Obesity, unspecified: Secondary | ICD-10-CM | POA: Diagnosis not present

## 2020-10-07 DIAGNOSIS — F5089 Other specified eating disorder: Secondary | ICD-10-CM | POA: Diagnosis not present

## 2020-10-11 DIAGNOSIS — K632 Fistula of intestine: Secondary | ICD-10-CM | POA: Diagnosis not present

## 2020-10-11 DIAGNOSIS — Z48815 Encounter for surgical aftercare following surgery on the digestive system: Secondary | ICD-10-CM | POA: Diagnosis not present

## 2020-10-11 DIAGNOSIS — Z9049 Acquired absence of other specified parts of digestive tract: Secondary | ICD-10-CM | POA: Diagnosis not present

## 2020-10-11 DIAGNOSIS — E669 Obesity, unspecified: Secondary | ICD-10-CM | POA: Diagnosis not present

## 2020-10-11 DIAGNOSIS — M3500 Sicca syndrome, unspecified: Secondary | ICD-10-CM | POA: Diagnosis not present

## 2020-10-11 DIAGNOSIS — Z452 Encounter for adjustment and management of vascular access device: Secondary | ICD-10-CM | POA: Diagnosis not present

## 2020-10-11 DIAGNOSIS — Z4801 Encounter for change or removal of surgical wound dressing: Secondary | ICD-10-CM | POA: Diagnosis not present

## 2020-10-11 DIAGNOSIS — Z6832 Body mass index (BMI) 32.0-32.9, adult: Secondary | ICD-10-CM | POA: Diagnosis not present

## 2020-10-11 DIAGNOSIS — K5652 Intestinal adhesions [bands] with complete obstruction: Secondary | ICD-10-CM | POA: Diagnosis not present

## 2020-10-11 DIAGNOSIS — I10 Essential (primary) hypertension: Secondary | ICD-10-CM | POA: Diagnosis not present

## 2020-10-11 DIAGNOSIS — Z432 Encounter for attention to ileostomy: Secondary | ICD-10-CM | POA: Diagnosis not present

## 2020-10-11 DIAGNOSIS — C181 Malignant neoplasm of appendix: Secondary | ICD-10-CM | POA: Diagnosis not present

## 2020-10-11 DIAGNOSIS — E7800 Pure hypercholesterolemia, unspecified: Secondary | ICD-10-CM | POA: Diagnosis not present

## 2020-10-14 DIAGNOSIS — F32A Depression, unspecified: Secondary | ICD-10-CM | POA: Diagnosis not present

## 2020-10-14 DIAGNOSIS — Z8719 Personal history of other diseases of the digestive system: Secondary | ICD-10-CM | POA: Diagnosis not present

## 2020-10-14 DIAGNOSIS — Z515 Encounter for palliative care: Secondary | ICD-10-CM | POA: Diagnosis not present

## 2020-10-14 DIAGNOSIS — R638 Other symptoms and signs concerning food and fluid intake: Secondary | ICD-10-CM | POA: Diagnosis not present

## 2020-10-14 DIAGNOSIS — Z79899 Other long term (current) drug therapy: Secondary | ICD-10-CM | POA: Diagnosis not present

## 2020-10-14 DIAGNOSIS — Z724 Inappropriate diet and eating habits: Secondary | ICD-10-CM | POA: Diagnosis not present

## 2020-10-14 DIAGNOSIS — R110 Nausea: Secondary | ICD-10-CM | POA: Diagnosis not present

## 2020-10-14 DIAGNOSIS — Z932 Ileostomy status: Secondary | ICD-10-CM | POA: Diagnosis not present

## 2020-10-14 DIAGNOSIS — C181 Malignant neoplasm of appendix: Secondary | ICD-10-CM | POA: Diagnosis not present

## 2020-10-14 DIAGNOSIS — Z79891 Long term (current) use of opiate analgesic: Secondary | ICD-10-CM | POA: Diagnosis not present

## 2020-10-14 DIAGNOSIS — F329 Major depressive disorder, single episode, unspecified: Secondary | ICD-10-CM | POA: Diagnosis not present

## 2020-10-14 DIAGNOSIS — G8918 Other acute postprocedural pain: Secondary | ICD-10-CM | POA: Diagnosis not present

## 2020-10-15 DIAGNOSIS — K56609 Unspecified intestinal obstruction, unspecified as to partial versus complete obstruction: Secondary | ICD-10-CM | POA: Diagnosis not present

## 2020-10-17 DIAGNOSIS — Z7689 Persons encountering health services in other specified circumstances: Secondary | ICD-10-CM | POA: Diagnosis not present

## 2020-10-18 DIAGNOSIS — I10 Essential (primary) hypertension: Secondary | ICD-10-CM | POA: Diagnosis not present

## 2020-10-18 DIAGNOSIS — K632 Fistula of intestine: Secondary | ICD-10-CM | POA: Diagnosis not present

## 2020-10-18 DIAGNOSIS — E785 Hyperlipidemia, unspecified: Secondary | ICD-10-CM | POA: Diagnosis not present

## 2020-10-18 DIAGNOSIS — C181 Malignant neoplasm of appendix: Secondary | ICD-10-CM | POA: Diagnosis not present

## 2020-10-18 DIAGNOSIS — E46 Unspecified protein-calorie malnutrition: Secondary | ICD-10-CM | POA: Diagnosis not present

## 2020-10-21 DIAGNOSIS — E785 Hyperlipidemia, unspecified: Secondary | ICD-10-CM | POA: Diagnosis not present

## 2020-10-21 DIAGNOSIS — I10 Essential (primary) hypertension: Secondary | ICD-10-CM | POA: Diagnosis not present

## 2020-10-21 DIAGNOSIS — C786 Secondary malignant neoplasm of retroperitoneum and peritoneum: Secondary | ICD-10-CM | POA: Diagnosis not present

## 2020-10-21 DIAGNOSIS — K56609 Unspecified intestinal obstruction, unspecified as to partial versus complete obstruction: Secondary | ICD-10-CM | POA: Diagnosis not present

## 2020-10-23 DIAGNOSIS — E46 Unspecified protein-calorie malnutrition: Secondary | ICD-10-CM | POA: Diagnosis not present

## 2020-10-23 DIAGNOSIS — Z48815 Encounter for surgical aftercare following surgery on the digestive system: Secondary | ICD-10-CM | POA: Diagnosis not present

## 2020-10-23 DIAGNOSIS — K56609 Unspecified intestinal obstruction, unspecified as to partial versus complete obstruction: Secondary | ICD-10-CM | POA: Diagnosis not present

## 2020-10-23 DIAGNOSIS — C181 Malignant neoplasm of appendix: Secondary | ICD-10-CM | POA: Diagnosis not present

## 2020-10-23 DIAGNOSIS — K632 Fistula of intestine: Secondary | ICD-10-CM | POA: Diagnosis not present

## 2020-10-25 DIAGNOSIS — Z452 Encounter for adjustment and management of vascular access device: Secondary | ICD-10-CM | POA: Diagnosis not present

## 2020-10-25 DIAGNOSIS — C181 Malignant neoplasm of appendix: Secondary | ICD-10-CM | POA: Diagnosis not present

## 2020-10-25 DIAGNOSIS — M3500 Sicca syndrome, unspecified: Secondary | ICD-10-CM | POA: Diagnosis not present

## 2020-10-25 DIAGNOSIS — Z9049 Acquired absence of other specified parts of digestive tract: Secondary | ICD-10-CM | POA: Diagnosis not present

## 2020-10-25 DIAGNOSIS — Z48815 Encounter for surgical aftercare following surgery on the digestive system: Secondary | ICD-10-CM | POA: Diagnosis not present

## 2020-10-25 DIAGNOSIS — K632 Fistula of intestine: Secondary | ICD-10-CM | POA: Diagnosis not present

## 2020-10-25 DIAGNOSIS — E7800 Pure hypercholesterolemia, unspecified: Secondary | ICD-10-CM | POA: Diagnosis not present

## 2020-10-25 DIAGNOSIS — Z6832 Body mass index (BMI) 32.0-32.9, adult: Secondary | ICD-10-CM | POA: Diagnosis not present

## 2020-10-25 DIAGNOSIS — Z432 Encounter for attention to ileostomy: Secondary | ICD-10-CM | POA: Diagnosis not present

## 2020-10-25 DIAGNOSIS — I10 Essential (primary) hypertension: Secondary | ICD-10-CM | POA: Diagnosis not present

## 2020-10-25 DIAGNOSIS — E669 Obesity, unspecified: Secondary | ICD-10-CM | POA: Diagnosis not present

## 2020-10-25 DIAGNOSIS — Z4801 Encounter for change or removal of surgical wound dressing: Secondary | ICD-10-CM | POA: Diagnosis not present

## 2020-10-25 DIAGNOSIS — K5652 Intestinal adhesions [bands] with complete obstruction: Secondary | ICD-10-CM | POA: Diagnosis not present

## 2020-10-28 DIAGNOSIS — R638 Other symptoms and signs concerning food and fluid intake: Secondary | ICD-10-CM | POA: Diagnosis not present

## 2020-10-28 DIAGNOSIS — R110 Nausea: Secondary | ICD-10-CM | POA: Diagnosis not present

## 2020-10-28 DIAGNOSIS — N390 Urinary tract infection, site not specified: Secondary | ICD-10-CM | POA: Diagnosis not present

## 2020-10-28 DIAGNOSIS — G8918 Other acute postprocedural pain: Secondary | ICD-10-CM | POA: Diagnosis not present

## 2020-10-28 DIAGNOSIS — F32A Depression, unspecified: Secondary | ICD-10-CM | POA: Diagnosis not present

## 2020-10-28 DIAGNOSIS — Z8719 Personal history of other diseases of the digestive system: Secondary | ICD-10-CM | POA: Diagnosis not present

## 2020-10-28 DIAGNOSIS — C181 Malignant neoplasm of appendix: Secondary | ICD-10-CM | POA: Diagnosis not present

## 2020-10-28 DIAGNOSIS — Z724 Inappropriate diet and eating habits: Secondary | ICD-10-CM | POA: Diagnosis not present

## 2020-10-28 DIAGNOSIS — Z932 Ileostomy status: Secondary | ICD-10-CM | POA: Diagnosis not present

## 2020-10-28 DIAGNOSIS — F329 Major depressive disorder, single episode, unspecified: Secondary | ICD-10-CM | POA: Diagnosis not present

## 2020-10-28 DIAGNOSIS — Z7689 Persons encountering health services in other specified circumstances: Secondary | ICD-10-CM | POA: Diagnosis not present

## 2020-10-28 DIAGNOSIS — Z515 Encounter for palliative care: Secondary | ICD-10-CM | POA: Diagnosis not present

## 2020-10-29 DIAGNOSIS — Z09 Encounter for follow-up examination after completed treatment for conditions other than malignant neoplasm: Secondary | ICD-10-CM | POA: Diagnosis not present

## 2020-10-29 DIAGNOSIS — Z8719 Personal history of other diseases of the digestive system: Secondary | ICD-10-CM | POA: Diagnosis not present

## 2020-10-30 DIAGNOSIS — K632 Fistula of intestine: Secondary | ICD-10-CM | POA: Diagnosis not present

## 2020-10-30 DIAGNOSIS — E46 Unspecified protein-calorie malnutrition: Secondary | ICD-10-CM | POA: Diagnosis not present

## 2020-10-30 DIAGNOSIS — C181 Malignant neoplasm of appendix: Secondary | ICD-10-CM | POA: Diagnosis not present

## 2020-11-04 DIAGNOSIS — K5652 Intestinal adhesions [bands] with complete obstruction: Secondary | ICD-10-CM | POA: Diagnosis not present

## 2020-11-06 DIAGNOSIS — K5652 Intestinal adhesions [bands] with complete obstruction: Secondary | ICD-10-CM | POA: Diagnosis not present

## 2020-11-06 DIAGNOSIS — Z4801 Encounter for change or removal of surgical wound dressing: Secondary | ICD-10-CM | POA: Diagnosis not present

## 2020-11-06 DIAGNOSIS — C181 Malignant neoplasm of appendix: Secondary | ICD-10-CM | POA: Diagnosis not present

## 2020-11-06 DIAGNOSIS — Z6832 Body mass index (BMI) 32.0-32.9, adult: Secondary | ICD-10-CM | POA: Diagnosis not present

## 2020-11-06 DIAGNOSIS — Z9049 Acquired absence of other specified parts of digestive tract: Secondary | ICD-10-CM | POA: Diagnosis not present

## 2020-11-06 DIAGNOSIS — Z452 Encounter for adjustment and management of vascular access device: Secondary | ICD-10-CM | POA: Diagnosis not present

## 2020-11-06 DIAGNOSIS — E669 Obesity, unspecified: Secondary | ICD-10-CM | POA: Diagnosis not present

## 2020-11-06 DIAGNOSIS — M3500 Sicca syndrome, unspecified: Secondary | ICD-10-CM | POA: Diagnosis not present

## 2020-11-06 DIAGNOSIS — E7800 Pure hypercholesterolemia, unspecified: Secondary | ICD-10-CM | POA: Diagnosis not present

## 2020-11-06 DIAGNOSIS — Z432 Encounter for attention to ileostomy: Secondary | ICD-10-CM | POA: Diagnosis not present

## 2020-11-06 DIAGNOSIS — K632 Fistula of intestine: Secondary | ICD-10-CM | POA: Diagnosis not present

## 2020-11-06 DIAGNOSIS — I10 Essential (primary) hypertension: Secondary | ICD-10-CM | POA: Diagnosis not present

## 2020-11-06 DIAGNOSIS — E46 Unspecified protein-calorie malnutrition: Secondary | ICD-10-CM | POA: Diagnosis not present

## 2020-11-06 DIAGNOSIS — Z48815 Encounter for surgical aftercare following surgery on the digestive system: Secondary | ICD-10-CM | POA: Diagnosis not present

## 2020-11-07 DIAGNOSIS — Z9049 Acquired absence of other specified parts of digestive tract: Secondary | ICD-10-CM | POA: Diagnosis not present

## 2020-11-07 DIAGNOSIS — Z48815 Encounter for surgical aftercare following surgery on the digestive system: Secondary | ICD-10-CM | POA: Diagnosis not present

## 2020-11-07 DIAGNOSIS — Z4801 Encounter for change or removal of surgical wound dressing: Secondary | ICD-10-CM | POA: Diagnosis not present

## 2020-11-07 DIAGNOSIS — K5652 Intestinal adhesions [bands] with complete obstruction: Secondary | ICD-10-CM | POA: Diagnosis not present

## 2020-11-07 DIAGNOSIS — Z6832 Body mass index (BMI) 32.0-32.9, adult: Secondary | ICD-10-CM | POA: Diagnosis not present

## 2020-11-07 DIAGNOSIS — Z432 Encounter for attention to ileostomy: Secondary | ICD-10-CM | POA: Diagnosis not present

## 2020-11-07 DIAGNOSIS — M3500 Sicca syndrome, unspecified: Secondary | ICD-10-CM | POA: Diagnosis not present

## 2020-11-07 DIAGNOSIS — K632 Fistula of intestine: Secondary | ICD-10-CM | POA: Diagnosis not present

## 2020-11-07 DIAGNOSIS — C181 Malignant neoplasm of appendix: Secondary | ICD-10-CM | POA: Diagnosis not present

## 2020-11-07 DIAGNOSIS — I10 Essential (primary) hypertension: Secondary | ICD-10-CM | POA: Diagnosis not present

## 2020-11-07 DIAGNOSIS — Z452 Encounter for adjustment and management of vascular access device: Secondary | ICD-10-CM | POA: Diagnosis not present

## 2020-11-07 DIAGNOSIS — E7800 Pure hypercholesterolemia, unspecified: Secondary | ICD-10-CM | POA: Diagnosis not present

## 2020-11-07 DIAGNOSIS — E669 Obesity, unspecified: Secondary | ICD-10-CM | POA: Diagnosis not present

## 2020-11-10 DIAGNOSIS — K632 Fistula of intestine: Secondary | ICD-10-CM | POA: Diagnosis not present

## 2020-11-10 DIAGNOSIS — Z452 Encounter for adjustment and management of vascular access device: Secondary | ICD-10-CM | POA: Diagnosis not present

## 2020-11-10 DIAGNOSIS — Z432 Encounter for attention to ileostomy: Secondary | ICD-10-CM | POA: Diagnosis not present

## 2020-11-10 DIAGNOSIS — K5652 Intestinal adhesions [bands] with complete obstruction: Secondary | ICD-10-CM | POA: Diagnosis not present

## 2020-11-10 DIAGNOSIS — C181 Malignant neoplasm of appendix: Secondary | ICD-10-CM | POA: Diagnosis not present

## 2020-11-10 DIAGNOSIS — E669 Obesity, unspecified: Secondary | ICD-10-CM | POA: Diagnosis not present

## 2020-11-10 DIAGNOSIS — Z48815 Encounter for surgical aftercare following surgery on the digestive system: Secondary | ICD-10-CM | POA: Diagnosis not present

## 2020-11-10 DIAGNOSIS — M3500 Sicca syndrome, unspecified: Secondary | ICD-10-CM | POA: Diagnosis not present

## 2020-11-10 DIAGNOSIS — Z9049 Acquired absence of other specified parts of digestive tract: Secondary | ICD-10-CM | POA: Diagnosis not present

## 2020-11-10 DIAGNOSIS — Z6832 Body mass index (BMI) 32.0-32.9, adult: Secondary | ICD-10-CM | POA: Diagnosis not present

## 2020-11-10 DIAGNOSIS — E7800 Pure hypercholesterolemia, unspecified: Secondary | ICD-10-CM | POA: Diagnosis not present

## 2020-11-10 DIAGNOSIS — Z4801 Encounter for change or removal of surgical wound dressing: Secondary | ICD-10-CM | POA: Diagnosis not present

## 2020-11-10 DIAGNOSIS — E46 Unspecified protein-calorie malnutrition: Secondary | ICD-10-CM | POA: Diagnosis not present

## 2020-11-10 DIAGNOSIS — I10 Essential (primary) hypertension: Secondary | ICD-10-CM | POA: Diagnosis not present

## 2020-11-11 DIAGNOSIS — K56609 Unspecified intestinal obstruction, unspecified as to partial versus complete obstruction: Secondary | ICD-10-CM | POA: Diagnosis not present

## 2020-11-12 DIAGNOSIS — F32A Depression, unspecified: Secondary | ICD-10-CM | POA: Diagnosis not present

## 2020-11-12 DIAGNOSIS — R52 Pain, unspecified: Secondary | ICD-10-CM | POA: Diagnosis not present

## 2020-11-12 DIAGNOSIS — C181 Malignant neoplasm of appendix: Secondary | ICD-10-CM | POA: Diagnosis not present

## 2020-11-12 DIAGNOSIS — Z9889 Other specified postprocedural states: Secondary | ICD-10-CM | POA: Diagnosis not present

## 2020-11-12 DIAGNOSIS — Z79899 Other long term (current) drug therapy: Secondary | ICD-10-CM | POA: Diagnosis not present

## 2020-11-12 DIAGNOSIS — F329 Major depressive disorder, single episode, unspecified: Secondary | ICD-10-CM | POA: Diagnosis not present

## 2020-11-12 DIAGNOSIS — R630 Anorexia: Secondary | ICD-10-CM | POA: Diagnosis not present

## 2020-11-12 DIAGNOSIS — G8918 Other acute postprocedural pain: Secondary | ICD-10-CM | POA: Diagnosis not present

## 2020-11-12 DIAGNOSIS — Z515 Encounter for palliative care: Secondary | ICD-10-CM | POA: Diagnosis not present

## 2020-11-13 DIAGNOSIS — C181 Malignant neoplasm of appendix: Secondary | ICD-10-CM | POA: Diagnosis not present

## 2020-11-13 DIAGNOSIS — K632 Fistula of intestine: Secondary | ICD-10-CM | POA: Diagnosis not present

## 2020-11-13 DIAGNOSIS — E46 Unspecified protein-calorie malnutrition: Secondary | ICD-10-CM | POA: Diagnosis not present

## 2020-11-14 DIAGNOSIS — K632 Fistula of intestine: Secondary | ICD-10-CM | POA: Diagnosis not present

## 2020-11-14 DIAGNOSIS — D373 Neoplasm of uncertain behavior of appendix: Secondary | ICD-10-CM | POA: Diagnosis not present

## 2020-11-14 DIAGNOSIS — K56609 Unspecified intestinal obstruction, unspecified as to partial versus complete obstruction: Secondary | ICD-10-CM | POA: Diagnosis not present

## 2020-11-18 DIAGNOSIS — C181 Malignant neoplasm of appendix: Secondary | ICD-10-CM | POA: Diagnosis not present

## 2020-11-18 DIAGNOSIS — K5652 Intestinal adhesions [bands] with complete obstruction: Secondary | ICD-10-CM | POA: Diagnosis not present

## 2020-11-18 DIAGNOSIS — K632 Fistula of intestine: Secondary | ICD-10-CM | POA: Diagnosis not present

## 2020-11-18 DIAGNOSIS — I10 Essential (primary) hypertension: Secondary | ICD-10-CM | POA: Diagnosis not present

## 2020-11-20 DIAGNOSIS — C181 Malignant neoplasm of appendix: Secondary | ICD-10-CM | POA: Diagnosis not present

## 2020-11-20 DIAGNOSIS — E46 Unspecified protein-calorie malnutrition: Secondary | ICD-10-CM | POA: Diagnosis not present

## 2020-11-20 DIAGNOSIS — K632 Fistula of intestine: Secondary | ICD-10-CM | POA: Diagnosis not present

## 2020-11-21 DIAGNOSIS — K632 Fistula of intestine: Secondary | ICD-10-CM | POA: Diagnosis not present

## 2020-11-21 DIAGNOSIS — E46 Unspecified protein-calorie malnutrition: Secondary | ICD-10-CM | POA: Diagnosis not present

## 2020-11-21 DIAGNOSIS — C181 Malignant neoplasm of appendix: Secondary | ICD-10-CM | POA: Diagnosis not present

## 2020-11-22 DIAGNOSIS — C181 Malignant neoplasm of appendix: Secondary | ICD-10-CM | POA: Diagnosis not present

## 2020-11-22 DIAGNOSIS — E46 Unspecified protein-calorie malnutrition: Secondary | ICD-10-CM | POA: Diagnosis not present

## 2020-11-22 DIAGNOSIS — K632 Fistula of intestine: Secondary | ICD-10-CM | POA: Diagnosis not present

## 2020-11-23 DIAGNOSIS — C181 Malignant neoplasm of appendix: Secondary | ICD-10-CM | POA: Diagnosis not present

## 2020-11-23 DIAGNOSIS — N3000 Acute cystitis without hematuria: Secondary | ICD-10-CM | POA: Diagnosis not present

## 2020-11-23 DIAGNOSIS — E46 Unspecified protein-calorie malnutrition: Secondary | ICD-10-CM | POA: Diagnosis not present

## 2020-11-23 DIAGNOSIS — K632 Fistula of intestine: Secondary | ICD-10-CM | POA: Diagnosis not present

## 2020-11-24 DIAGNOSIS — E46 Unspecified protein-calorie malnutrition: Secondary | ICD-10-CM | POA: Diagnosis not present

## 2020-11-24 DIAGNOSIS — C181 Malignant neoplasm of appendix: Secondary | ICD-10-CM | POA: Diagnosis not present

## 2020-11-24 DIAGNOSIS — K632 Fistula of intestine: Secondary | ICD-10-CM | POA: Diagnosis not present

## 2020-11-25 DIAGNOSIS — I10 Essential (primary) hypertension: Secondary | ICD-10-CM | POA: Diagnosis not present

## 2020-11-25 DIAGNOSIS — K632 Fistula of intestine: Secondary | ICD-10-CM | POA: Diagnosis not present

## 2020-11-25 DIAGNOSIS — E46 Unspecified protein-calorie malnutrition: Secondary | ICD-10-CM | POA: Diagnosis not present

## 2020-11-25 DIAGNOSIS — K5652 Intestinal adhesions [bands] with complete obstruction: Secondary | ICD-10-CM | POA: Diagnosis not present

## 2020-11-25 DIAGNOSIS — C181 Malignant neoplasm of appendix: Secondary | ICD-10-CM | POA: Diagnosis not present

## 2020-11-26 DIAGNOSIS — E46 Unspecified protein-calorie malnutrition: Secondary | ICD-10-CM | POA: Diagnosis not present

## 2020-11-26 DIAGNOSIS — K632 Fistula of intestine: Secondary | ICD-10-CM | POA: Diagnosis not present

## 2020-11-26 DIAGNOSIS — C181 Malignant neoplasm of appendix: Secondary | ICD-10-CM | POA: Diagnosis not present

## 2020-12-02 DIAGNOSIS — K5652 Intestinal adhesions [bands] with complete obstruction: Secondary | ICD-10-CM | POA: Diagnosis not present

## 2020-12-05 DIAGNOSIS — E46 Unspecified protein-calorie malnutrition: Secondary | ICD-10-CM | POA: Diagnosis not present

## 2020-12-05 DIAGNOSIS — C181 Malignant neoplasm of appendix: Secondary | ICD-10-CM | POA: Diagnosis not present

## 2020-12-05 DIAGNOSIS — K632 Fistula of intestine: Secondary | ICD-10-CM | POA: Diagnosis not present

## 2020-12-10 DIAGNOSIS — R52 Pain, unspecified: Secondary | ICD-10-CM | POA: Diagnosis not present

## 2020-12-10 DIAGNOSIS — Z9181 History of falling: Secondary | ICD-10-CM | POA: Diagnosis not present

## 2020-12-10 DIAGNOSIS — C181 Malignant neoplasm of appendix: Secondary | ICD-10-CM | POA: Diagnosis not present

## 2020-12-10 DIAGNOSIS — M3500 Sicca syndrome, unspecified: Secondary | ICD-10-CM | POA: Diagnosis not present

## 2020-12-10 DIAGNOSIS — K632 Fistula of intestine: Secondary | ICD-10-CM | POA: Diagnosis not present

## 2020-12-10 DIAGNOSIS — Z9889 Other specified postprocedural states: Secondary | ICD-10-CM | POA: Diagnosis not present

## 2020-12-10 DIAGNOSIS — R638 Other symptoms and signs concerning food and fluid intake: Secondary | ICD-10-CM | POA: Diagnosis not present

## 2020-12-10 DIAGNOSIS — Z9081 Acquired absence of spleen: Secondary | ICD-10-CM | POA: Diagnosis not present

## 2020-12-10 DIAGNOSIS — Z515 Encounter for palliative care: Secondary | ICD-10-CM | POA: Diagnosis not present

## 2020-12-10 DIAGNOSIS — Z79899 Other long term (current) drug therapy: Secondary | ICD-10-CM | POA: Diagnosis not present

## 2020-12-10 DIAGNOSIS — F32A Depression, unspecified: Secondary | ICD-10-CM | POA: Diagnosis not present

## 2020-12-10 DIAGNOSIS — K9419 Other complications of enterostomy: Secondary | ICD-10-CM | POA: Diagnosis not present

## 2020-12-10 DIAGNOSIS — R630 Anorexia: Secondary | ICD-10-CM | POA: Diagnosis not present

## 2020-12-10 DIAGNOSIS — Z87891 Personal history of nicotine dependence: Secondary | ICD-10-CM | POA: Diagnosis not present

## 2020-12-10 DIAGNOSIS — Z6832 Body mass index (BMI) 32.0-32.9, adult: Secondary | ICD-10-CM | POA: Diagnosis not present

## 2020-12-10 DIAGNOSIS — E669 Obesity, unspecified: Secondary | ICD-10-CM | POA: Diagnosis not present

## 2020-12-10 DIAGNOSIS — K565 Intestinal adhesions [bands], unspecified as to partial versus complete obstruction: Secondary | ICD-10-CM | POA: Diagnosis not present

## 2020-12-10 DIAGNOSIS — E7800 Pure hypercholesterolemia, unspecified: Secondary | ICD-10-CM | POA: Diagnosis not present

## 2020-12-10 DIAGNOSIS — I10 Essential (primary) hypertension: Secondary | ICD-10-CM | POA: Diagnosis not present

## 2020-12-10 DIAGNOSIS — K5652 Intestinal adhesions [bands] with complete obstruction: Secondary | ICD-10-CM | POA: Diagnosis not present

## 2020-12-10 DIAGNOSIS — G4700 Insomnia, unspecified: Secondary | ICD-10-CM | POA: Diagnosis not present

## 2020-12-10 DIAGNOSIS — Z452 Encounter for adjustment and management of vascular access device: Secondary | ICD-10-CM | POA: Diagnosis not present

## 2020-12-10 DIAGNOSIS — Z432 Encounter for attention to ileostomy: Secondary | ICD-10-CM | POA: Diagnosis not present

## 2020-12-11 DIAGNOSIS — Z432 Encounter for attention to ileostomy: Secondary | ICD-10-CM | POA: Diagnosis not present

## 2020-12-11 DIAGNOSIS — E669 Obesity, unspecified: Secondary | ICD-10-CM | POA: Diagnosis not present

## 2020-12-11 DIAGNOSIS — Z452 Encounter for adjustment and management of vascular access device: Secondary | ICD-10-CM | POA: Diagnosis not present

## 2020-12-11 DIAGNOSIS — K632 Fistula of intestine: Secondary | ICD-10-CM | POA: Diagnosis not present

## 2020-12-11 DIAGNOSIS — E46 Unspecified protein-calorie malnutrition: Secondary | ICD-10-CM | POA: Diagnosis not present

## 2020-12-11 DIAGNOSIS — C181 Malignant neoplasm of appendix: Secondary | ICD-10-CM | POA: Diagnosis not present

## 2020-12-11 DIAGNOSIS — E7800 Pure hypercholesterolemia, unspecified: Secondary | ICD-10-CM | POA: Diagnosis not present

## 2020-12-11 DIAGNOSIS — Z9081 Acquired absence of spleen: Secondary | ICD-10-CM | POA: Diagnosis not present

## 2020-12-11 DIAGNOSIS — Z6832 Body mass index (BMI) 32.0-32.9, adult: Secondary | ICD-10-CM | POA: Diagnosis not present

## 2020-12-11 DIAGNOSIS — M3500 Sicca syndrome, unspecified: Secondary | ICD-10-CM | POA: Diagnosis not present

## 2020-12-11 DIAGNOSIS — Z9181 History of falling: Secondary | ICD-10-CM | POA: Diagnosis not present

## 2020-12-11 DIAGNOSIS — K5652 Intestinal adhesions [bands] with complete obstruction: Secondary | ICD-10-CM | POA: Diagnosis not present

## 2020-12-11 DIAGNOSIS — I10 Essential (primary) hypertension: Secondary | ICD-10-CM | POA: Diagnosis not present

## 2020-12-11 DIAGNOSIS — Z87891 Personal history of nicotine dependence: Secondary | ICD-10-CM | POA: Diagnosis not present

## 2020-12-13 DIAGNOSIS — E46 Unspecified protein-calorie malnutrition: Secondary | ICD-10-CM | POA: Diagnosis not present

## 2020-12-13 DIAGNOSIS — C181 Malignant neoplasm of appendix: Secondary | ICD-10-CM | POA: Diagnosis not present

## 2020-12-13 DIAGNOSIS — K632 Fistula of intestine: Secondary | ICD-10-CM | POA: Diagnosis not present

## 2020-12-16 DIAGNOSIS — K56609 Unspecified intestinal obstruction, unspecified as to partial versus complete obstruction: Secondary | ICD-10-CM | POA: Diagnosis not present

## 2020-12-19 DIAGNOSIS — C181 Malignant neoplasm of appendix: Secondary | ICD-10-CM | POA: Diagnosis not present

## 2020-12-19 DIAGNOSIS — E46 Unspecified protein-calorie malnutrition: Secondary | ICD-10-CM | POA: Diagnosis not present

## 2020-12-19 DIAGNOSIS — K632 Fistula of intestine: Secondary | ICD-10-CM | POA: Diagnosis not present

## 2020-12-20 DIAGNOSIS — C181 Malignant neoplasm of appendix: Secondary | ICD-10-CM | POA: Diagnosis not present

## 2020-12-20 DIAGNOSIS — K632 Fistula of intestine: Secondary | ICD-10-CM | POA: Diagnosis not present

## 2020-12-20 DIAGNOSIS — E46 Unspecified protein-calorie malnutrition: Secondary | ICD-10-CM | POA: Diagnosis not present

## 2020-12-21 DIAGNOSIS — E46 Unspecified protein-calorie malnutrition: Secondary | ICD-10-CM | POA: Diagnosis not present

## 2020-12-21 DIAGNOSIS — C181 Malignant neoplasm of appendix: Secondary | ICD-10-CM | POA: Diagnosis not present

## 2020-12-21 DIAGNOSIS — K632 Fistula of intestine: Secondary | ICD-10-CM | POA: Diagnosis not present

## 2020-12-22 DIAGNOSIS — K632 Fistula of intestine: Secondary | ICD-10-CM | POA: Diagnosis not present

## 2020-12-22 DIAGNOSIS — E46 Unspecified protein-calorie malnutrition: Secondary | ICD-10-CM | POA: Diagnosis not present

## 2020-12-22 DIAGNOSIS — C181 Malignant neoplasm of appendix: Secondary | ICD-10-CM | POA: Diagnosis not present

## 2020-12-23 DIAGNOSIS — K632 Fistula of intestine: Secondary | ICD-10-CM | POA: Diagnosis not present

## 2020-12-23 DIAGNOSIS — K5652 Intestinal adhesions [bands] with complete obstruction: Secondary | ICD-10-CM | POA: Diagnosis not present

## 2020-12-23 DIAGNOSIS — C181 Malignant neoplasm of appendix: Secondary | ICD-10-CM | POA: Diagnosis not present

## 2020-12-23 DIAGNOSIS — E46 Unspecified protein-calorie malnutrition: Secondary | ICD-10-CM | POA: Diagnosis not present

## 2020-12-24 DIAGNOSIS — E46 Unspecified protein-calorie malnutrition: Secondary | ICD-10-CM | POA: Diagnosis not present

## 2020-12-24 DIAGNOSIS — C181 Malignant neoplasm of appendix: Secondary | ICD-10-CM | POA: Diagnosis not present

## 2020-12-24 DIAGNOSIS — K632 Fistula of intestine: Secondary | ICD-10-CM | POA: Diagnosis not present

## 2020-12-25 DIAGNOSIS — K632 Fistula of intestine: Secondary | ICD-10-CM | POA: Diagnosis not present

## 2020-12-25 DIAGNOSIS — E46 Unspecified protein-calorie malnutrition: Secondary | ICD-10-CM | POA: Diagnosis not present

## 2020-12-25 DIAGNOSIS — K56609 Unspecified intestinal obstruction, unspecified as to partial versus complete obstruction: Secondary | ICD-10-CM | POA: Diagnosis not present

## 2020-12-25 DIAGNOSIS — C181 Malignant neoplasm of appendix: Secondary | ICD-10-CM | POA: Diagnosis not present

## 2020-12-25 DIAGNOSIS — Z789 Other specified health status: Secondary | ICD-10-CM | POA: Diagnosis not present

## 2020-12-25 DIAGNOSIS — D373 Neoplasm of uncertain behavior of appendix: Secondary | ICD-10-CM | POA: Diagnosis not present

## 2021-01-06 DIAGNOSIS — K56609 Unspecified intestinal obstruction, unspecified as to partial versus complete obstruction: Secondary | ICD-10-CM | POA: Diagnosis not present

## 2021-01-06 DIAGNOSIS — D373 Neoplasm of uncertain behavior of appendix: Secondary | ICD-10-CM | POA: Diagnosis not present

## 2021-01-06 DIAGNOSIS — Z452 Encounter for adjustment and management of vascular access device: Secondary | ICD-10-CM | POA: Diagnosis not present

## 2021-01-06 DIAGNOSIS — C800 Disseminated malignant neoplasm, unspecified: Secondary | ICD-10-CM | POA: Diagnosis not present

## 2021-01-08 ENCOUNTER — Ambulatory Visit: Payer: PPO | Attending: Nurse Practitioner | Admitting: Physical Therapy

## 2021-01-08 ENCOUNTER — Other Ambulatory Visit: Payer: Self-pay

## 2021-01-08 ENCOUNTER — Encounter: Payer: Self-pay | Admitting: Physical Therapy

## 2021-01-08 DIAGNOSIS — M546 Pain in thoracic spine: Secondary | ICD-10-CM | POA: Diagnosis not present

## 2021-01-08 DIAGNOSIS — M6281 Muscle weakness (generalized): Secondary | ICD-10-CM | POA: Diagnosis not present

## 2021-01-08 DIAGNOSIS — R262 Difficulty in walking, not elsewhere classified: Secondary | ICD-10-CM

## 2021-01-08 NOTE — Patient Instructions (Signed)
Access Code: [email or web address] URL: [email or web address] Date: 01/08/2021 Prepared by: Amador Cunas  Exercises Standing Lower Cervical and Upper Thoracic Stretch - 1 x daily - 7 x weekly - 3 sets - 2 reps - 15-20 sec hold Seated Thoracic Lumbar Extension with Pectoralis Stretch - 1 x daily - 7 x weekly - 3 sets - 10 reps - 3 sec hold Seated Thoracic Flexion and Rotation with Swiss Ball - 1 x daily - 7 x weekly - 3 sets - 5 reps - 5 sec hold Seated Flexion Stretch with Swiss Ball - 1 x daily - 7 x weekly - 3 sets - 5 reps - 5 sec hold

## 2021-01-08 NOTE — Therapy (Signed)
[place]. [place], [place], [address] Phone: [phone]   Fax:  [phone]  Physical Therapy Evaluation  Patient Details  Name: Kristy Sherman MRN: [id number] Date of Birth: 23-Sep-1950 Referring Provider (PT): Staley   Encounter Date: 01/08/2021   PT End of Session - 01/08/21 1038     Visit Number 1    Date for PT Re-Evaluation 04/02/21    PT Start Time 5027    PT Stop Time 1037    PT Time Calculation (min) 35 min    Activity Tolerance Patient tolerated treatment well    Behavior During Therapy [facility] for tasks assessed/performed             Past Medical History:  Diagnosis Date   ANKLE PAIN, LEFT 10/17/2007   Qualifier: Diagnosis of  By: Jerold Coombe     Hyperlipidemia    Hypertension    Obesity    Sjoegren syndrome     Past Surgical History:  Procedure Laterality Date   ABDOMINAL HYSTERECTOMY     [place] N/A 11/11/2018   Procedure: APPENDECTOMY LAPAROSCOPIC;  Surgeon: Michael Boston, MD;  Location: [facility];  Service: General;  Laterality: N/A;   SPLENECTOMY, TOTAL      There were no vitals filed for this visit.    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 On 09/11/20 pt presented to ED with increased vomiting and abdominal pain; underwent exploratory laparatomy on 09/18/20. Found to have extensive adhesions, small bowel resection performed, and loop ilieostomy created. Pt returned home 10/11/2020. She has been receiving HH PT since returning home, discharged Monday 01/06/21. Had PICC line in place, this was removed Monday 01/06/21. Pt has illiostomy in place now. Pt is uncertain regarding limitations, reports she may have lifting limitations <10 lbs; we will follow up on this with the  MD. Pt reports that she is walking between 10-15 minutes at a time now. Pt states no falls. Pt reports endurance is big limiting factor. Pt also endorses chronic L shoulder pain along with L periscapular pain intermittently painful, worsened when she picked up her 20lb dog at home. Pain is worse with rotation and lifting. Denies N/T or radiating pain.    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 10-15 minutes; up to 0.25 miles    Diagnostic tests xrays/CT scan    Patient Stated Goals increase strength, improve endurance, get back to [facility]    Currently in Pain? Yes    Pain Score 8    when she moves to reach/twist   Pain Location Back   thoracic towards L periscapulars   Pain Orientation Mid;Left    Pain Descriptors / Indicators Sharp    Pain Type Acute pain    Pain Onset More than a month ago    Pain Frequency Intermittent    Aggravating Factors  reaching, lifting, twisting    Pain Relieving Factors rest, tylenol                [facility] PT Assessment - 01/08/21 0001       Assessment   Medical Diagnosis  Deconditioning/LE weakness    Referring Provider (PT) Staley    Onset Date/Surgical Date 09/18/20    Next MD Visit 03/15/2021    Prior Therapy [facility] PT after surgery      Precautions   Precautions None      Restrictions   Weight Bearing Restrictions No      Balance Screen   Has the patient fallen in the past 6 months No    Has the patient had a decrease in activity level because of a fear of falling?  No    Is the patient reluctant to leave their home because of a fear of falling?  No      Home Environment   Additional Comments stairs at home; reports some difficulty with stairs d/t endurance      Prior Function   Level of Independence Independent    Vocation Retired    Leisure  walking, [employer], [employer]      Functional Tests   Functional tests Sit to [employer] to Stand   Comments WFL, mild difficulty with eccentric control      Posture/Postural Control   Posture/Postural Control Postural limitations    Postural Limitations Rounded Shoulders;Forward head    Posture Comments some thoracic kyphosis with seated posture d/t abdominal tightness with upright posture      ROM / Strength   AROM / PROM / Strength AROM;Strength      AROM   AROM Assessment Site Lumbar    Lumbar Flexion limited 25% d/t L sided thoracic pain    Lumbar Extension WFL + pain at end range    Lumbar - Right Side Bend limited 25%    Lumbar - Left Side Bend limited 25%    Lumbar - Right Rotation WFL; slow/guarded movements    Lumbar - Left Rotation WFL; slow/guarded movements      Strength   Overall Strength Comments BLE 4+/5 except B hip abd/ext 4/5      Palpation   Palpation comment ttp L thoracic parapinals; medial scapula border      Transfers   Five time sit to stand comments  12.95 sec      Ambulation/Gait   Gait Comments narrow BOS, hip weakness, ind with ambulation over level surfaces      Standardized Balance Assessment   Standardized Balance Assessment Timed Up and Go Test;Five Times Sit to Stand    Five times sit to stand comments  12.95 sec      Timed Up and Go Test   Normal TUG (seconds) 10.02                        Objective measurements completed on examination: See above findings.               PT Education - 01/08/21 1037     Education Details Pt educated on POC and HEP    Person(s) Educated Patient    Methods Explanation;Demonstration;Handout    Comprehension Verbalized understanding;Returned demonstration              PT Short Term Goals - 01/08/21 1339       PT SHORT TERM GOAL #1   Title Pt will be I with initial HEP    Time 2    Period Weeks    Status New    Target Date 01/22/21               PT  Long Term Goals - 01/08/21 1339       PT LONG TERM GOAL #1   Title Pt will be I with advanced HEP    Time 8    Period Weeks    Status New    Target Date 03/05/21      PT LONG TERM GOAL #2   Title Pt will demo BLE strength 5/5    Time 8    Period Weeks    Status New    Target Date 03/05/21      PT LONG TERM GOAL #3   Title Pt will report able to stand in kitchen to cook meal or >30 min standing    Period Weeks    Status New    Target Date 03/05/21      PT LONG TERM GOAL #4   Title Pt will report able to safely return to [employer]/gym and I with exercise routine    Time 8    Period Weeks    Status New    Target Date 03/05/21                    Plan - 01/08/21 1330     Clinical Impression Statement Pt was seen by this clinic last year following abdominal surgery; had original goal of getting back to gym/[employer] and increasing walking program. On 09/11/20 of this year she was hospitalized with severe abdominal pain and nausea/vomiting. On 09/18/20 underwent an exploratory laparatomy and now has ileostomy. 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 Pt received HHPT from hospital d/c 10/11/20 to 01/06/21. Has HEP from HHPT that she is continuing. Added in thoracic stretching ex's today to address L thoracic/shoulder pain. Pt demos TUG and 5x STS WFL. Some difficulty with eccentric control with STS. BLE strength deficits and quick to fatigue with standing ex's. Pt would benefit from education on safe return to gym ex's, endurance ex's, and functional LE strengthening. Address thoracic pain as indicated. Will clarify lifting precautions with MD and update note accordingly.    Personal Factors and Comorbidities Comorbidity 2    Comorbidities malignant neoplasm of appendix (2020), HTN    Examination-Activity Limitations Lift;Stand;Stairs;Locomotion Level     Stability/Clinical Decision Making Evolving/Moderate complexity    Clinical Decision Making Moderate    Rehab Potential Good    PT Frequency 2x / week    PT Duration 8 weeks    PT Treatment/Interventions ADLs/Self Care Home Management;Neuromuscular re-education;Therapeutic exercise;Therapeutic activities;Functional mobility training;Stair training;Gait training;Patient/family education;Manual techniques;Passive range of motion;Scar mobilization    PT Next Visit Plan education on safe return to gym ex's, endurance ex's, and functional LE strengthening. Address thoracic pain as indicated.    PT Home Exercise Plan see pt instructions    Consulted and Agree with Plan of Care Patient             Patient will benefit from skilled therapeutic intervention in order to improve the following deficits and impairments:  Abnormal gait, Difficulty walking, Decreased endurance, Decreased activity tolerance, Impaired flexibility, Decreased strength, Decreased mobility, Postural dysfunction  Visit Diagnosis: Muscle weakness (generalized)  Difficulty in walking, not elsewhere classified  Pain in thoracic spine     Problem List Patient Active Problem List   Diagnosis Date Noted   Dyspareunia in female 11/11/2018   Atrophic vaginitis 11/11/2018   Acute perforated appendicitis 11/11/2018   Acute appendicitis with perforation and localized peritonitis 11/11/2018   Sjogren's  syndrome ([place])    Obesity    Diverticulosis of colon  11/10/2012   Hyperlipidemia 02/24/2007   Essential hypertension 02/24/2007   Amador Cunas, PT, DPT Donald Prose Regla Fitzgibbon 01/08/2021, 1:41 PM  [place]. [place], [place], [address] Phone: [phone]   Fax:  [phone]  Name: Kristy Sherman MRN: [id number] Date of Birth: [birth year]

## 2021-01-13 DIAGNOSIS — M62838 Other muscle spasm: Secondary | ICD-10-CM | POA: Diagnosis not present

## 2021-01-13 DIAGNOSIS — S29012A Strain of muscle and tendon of back wall of thorax, initial encounter: Secondary | ICD-10-CM | POA: Diagnosis not present

## 2021-01-14 ENCOUNTER — Ambulatory Visit: Payer: PPO | Admitting: Physical Therapy

## 2021-01-17 ENCOUNTER — Ambulatory Visit: Payer: PPO | Admitting: Physical Therapy

## 2021-01-21 ENCOUNTER — Other Ambulatory Visit: Payer: Self-pay

## 2021-01-21 ENCOUNTER — Ambulatory Visit: Payer: PPO | Attending: Nurse Practitioner | Admitting: Physical Therapy

## 2021-01-21 ENCOUNTER — Encounter: Payer: Self-pay | Admitting: Physical Therapy

## 2021-01-21 DIAGNOSIS — M6281 Muscle weakness (generalized): Secondary | ICD-10-CM | POA: Diagnosis not present

## 2021-01-21 DIAGNOSIS — R262 Difficulty in walking, not elsewhere classified: Secondary | ICD-10-CM | POA: Diagnosis not present

## 2021-01-21 DIAGNOSIS — M546 Pain in thoracic spine: Secondary | ICD-10-CM | POA: Diagnosis not present

## 2021-01-21 NOTE — Therapy (Signed)
[place]. [place], [place], [address] Phone: [phone]   Fax:  [phone]  Physical Therapy Treatment  Patient Details  Name: Kristy Sherman MRN: [id number] Date of Birth: [birth year] Referring Provider (PT): Staley   Encounter Date: 01/21/2021   PT End of Session - 01/21/21 1240     Visit Number 2    Date for PT Re-Evaluation 04/02/21    PT Start Time 0929    PT Stop Time 1010    PT Time Calculation (min) 41 min    Activity Tolerance Patient tolerated treatment well    Behavior During Therapy [facility] for tasks assessed/performed             Past Medical History:  Diagnosis Date   ANKLE PAIN, LEFT 10/17/2007   Qualifier: Diagnosis of  By: Jerold Coombe     Hyperlipidemia    Hypertension    Obesity    Sjoegren syndrome     Past Surgical History:  Procedure Laterality Date   ABDOMINAL HYSTERECTOMY     [place] N/A 11/11/2018   Procedure: APPENDECTOMY LAPAROSCOPIC;  Surgeon: Michael Boston, MD;  Location: [facility];  Service: General;  Laterality: N/A;   SPLENECTOMY, TOTAL      There were no vitals filed for this visit.   Subjective Assessment - 01/21/21 0931     Subjective Pt is doing well. She went to the doctor last week due to her back pain and was given a muscle relaxer and now she is taking tylenol for her pain. She was gardening yesterday so she is having a little bit of pain. While sitting or laying down her pain is decreased while moving around its a 6/10. Pt reports she has not been doing her home exercises.    Pain Score 3     Pain Location Back                               [facility] Adult PT Treatment/Exercise - 01/21/21 0001       Exercises   Exercises Lumbar      Lumbar Exercises: Stretches   Single Knee to Chest Stretch 2 reps;30 seconds;Right;Left    Other Lumbar  Stretch Exercise trunk flexion all roll out 2x10, with rotation 2x10    Other Lumbar Stretch Exercise corner 2x30 sec, upper thoracic lower cervical stretch 2x30 sec      Lumbar Exercises: Aerobic   UBE (Upper Arm Bike) L1 x 19min each way      Lumbar Exercises: Standing   Row Left;Right;20 reps;Theraband    Theraband Level (Row) Level 2 (Red)    Shoulder Extension Left;Right;20 reps;Theraband    Theraband Level (Shoulder Extension) Level 2 (Red)      Lumbar Exercises: Seated   Long Arc Quad on Chair 2 sets;10 reps;Weights    LAQ on Chair Weights (lbs) 2#    Other Seated Lumbar Exercises abdominal bracing 2x10    Other Seated Lumbar Exercises diagnals  x10 yellow ball   pt reported pain in her shoulder     Lumbar Exercises: Supine   Bridge 20 reps    Other Supine Lumbar Exercises marching red tband 2x10                      PT  Short Term Goals - 01/21/21 1111       PT SHORT TERM GOAL #1   Title Pt will be I with initial HEP    Time 2    Period Weeks    Status On-going    Target Date 01/22/21               PT Long Term Goals - 01/08/21 1339       PT LONG TERM GOAL #1   Title Pt will be I with advanced HEP    Time 8    Period Weeks    Status New    Target Date 03/05/21      PT LONG TERM GOAL #2   Title Pt will demo BLE strength 5/5    Time 8    Period Weeks    Status New    Target Date 03/05/21      PT LONG TERM GOAL #3   Title Pt will report able to stand in kitchen to cook meal or >30 min standing    Period Weeks    Status New    Target Date 03/05/21      PT LONG TERM GOAL #4   Title Pt will report able to safely return to [employer]/gym and I with exercise routine    Time 8    Period Weeks    Status New    Target Date 03/05/21                   Plan - 01/21/21 1017     Clinical Impression Statement Pt tolerated all exercises well. She needed cueing for upright posture during standing exercises. Pt also reports some shoulder  pain during diagonals with a ball so exercise was stopped. She also reports last week her pain was a 10/10 so she went to the doctor and got some medication and now her pain is a 6/10. Due to the pain she has not completed her HEP.    PT Treatment/Interventions ADLs/Self Care Home Management;Neuromuscular re-education;Therapeutic exercise;Therapeutic activities;Functional mobility training;Stair training;Gait training;Patient/family education;Manual techniques;Passive range of motion;Scar mobilization    PT Next Visit Plan education on safe return to gym ex's, endurance ex's, and functional LE strengthening. Address thoracic pain as indicated.    PT Home Exercise Plan see pt instructions             Patient will benefit from skilled therapeutic intervention in order to improve the following deficits and impairments:  Abnormal gait, Difficulty walking, Decreased endurance, Decreased activity tolerance, Impaired flexibility, Decreased strength, Decreased mobility, Postural dysfunction  Visit Diagnosis: Muscle weakness (generalized)  Difficulty in walking, not elsewhere classified  Pain in thoracic spine     Problem List Patient Active Problem List   Diagnosis Date Noted   Dyspareunia in female 11/11/2018   Atrophic vaginitis 11/11/2018   Acute perforated appendicitis 11/11/2018   Acute appendicitis with perforation and localized peritonitis 11/11/2018   Sjogren's syndrome ([place])    Obesity    Diverticulosis of colon  11/10/2012   Hyperlipidemia 02/24/2007   Essential hypertension 02/24/2007    Dawayne Cirri, SPTA 01/21/2021, 12:41 PM  [place]. [place], [place], [address] Phone: [phone]   Fax:  [phone]  Name: TALEYA WHITCHER MRN: [id number] Date of Birth: 23-Dec-1950

## 2021-01-23 ENCOUNTER — Ambulatory Visit: Payer: PPO | Admitting: Physical Therapy

## 2021-01-23 ENCOUNTER — Encounter: Payer: Self-pay | Admitting: Physical Therapy

## 2021-01-23 ENCOUNTER — Other Ambulatory Visit: Payer: Self-pay

## 2021-01-23 DIAGNOSIS — M546 Pain in thoracic spine: Secondary | ICD-10-CM

## 2021-01-23 DIAGNOSIS — M6281 Muscle weakness (generalized): Secondary | ICD-10-CM

## 2021-01-23 DIAGNOSIS — R262 Difficulty in walking, not elsewhere classified: Secondary | ICD-10-CM

## 2021-01-23 NOTE — Therapy (Signed)
[place]. [place], [place], [address] Phone: [phone]   Fax:  [phone]  Physical Therapy Treatment  Patient Details  Name: Kristy Sherman MRN: [id number] Date of Birth: 22-Jul-1950 Referring Provider (PT): Staley   Encounter Date: 01/23/2021   PT End of Session - 01/23/21 1019     Visit Number 3    Date for PT Re-Evaluation 04/02/21    PT Start Time 0929    PT Stop Time 1011    PT Time Calculation (min) 42 min    Activity Tolerance Patient tolerated treatment well    Behavior During Therapy [facility] for tasks assessed/performed             Past Medical History:  Diagnosis Date   ANKLE PAIN, LEFT 10/17/2007   Qualifier: Diagnosis of  By: Jerold Coombe     Hyperlipidemia    Hypertension    Obesity    Sjoegren syndrome     Past Surgical History:  Procedure Laterality Date   ABDOMINAL HYSTERECTOMY     [place] N/A 11/11/2018   Procedure: APPENDECTOMY LAPAROSCOPIC;  Surgeon: Michael Boston, MD;  Location: [facility];  Service: General;  Laterality: N/A;   SPLENECTOMY, TOTAL      There were no vitals filed for this visit.   Subjective Assessment - 01/23/21 0931     Subjective Pt is doing well. She is still having upper back and L shoulder pain.    Currently in Pain? Yes    Pain Score 4     Pain Location Back    Pain Orientation Left;Mid    Aggravating Factors  reaching, lifitng, twisting    Pain Relieving Factors rest, tylenol                               [facility] Adult PT Treatment/Exercise - 01/23/21 0001       Lumbar Exercises: Aerobic   Nustep L5 x 51min      Lumbar Exercises: Standing   Row Left;Right;Theraband   2x12   Theraband Level (Row) Level 2 (Red)    Shoulder Extension Left;Right;Theraband   2x12   Theraband Level (Shoulder Extension) Level 2 (Red)    Other  Standing Lumbar Exercises step ups 6" 2x10    Other Standing Lumbar Exercises trunk extension against the wall 2x5 yellow ball, trunk rotation to wall with yellow ball 2x10      Lumbar Exercises: Seated   Sit to Stand 20 reps   on airex   Other Seated Lumbar Exercises abdominal bracing 2x10    Other Seated Lumbar Exercises horizontal abduction yellow tband 2x10      Manual Therapy   Manual Therapy Soft tissue mobilization    Soft tissue mobilization L deltoid, upper trap, paraspinal                      PT Short Term Goals - 01/21/21 1111       PT SHORT TERM GOAL #1   Title Pt will be I with initial HEP    Time 2    Period Weeks    Status On-going    Target Date 01/22/21               PT Long Term Goals - 01/08/21 1339  PT LONG TERM GOAL #1   Title Pt will be I with advanced HEP    Time 8    Period Weeks    Status New    Target Date 03/05/21      PT LONG TERM GOAL #2   Title Pt will demo BLE strength 5/5    Time 8    Period Weeks    Status New    Target Date 03/05/21      PT LONG TERM GOAL #3   Title Pt will report able to stand in kitchen to cook meal or >30 min standing    Period Weeks    Status New    Target Date 03/05/21      PT LONG TERM GOAL #4   Title Pt will report able to safely return to [employer]/gym and I with exercise routine    Time 8    Period Weeks    Status New    Target Date 03/05/21                   Plan - 01/23/21 1011     Clinical Impression Statement Pt tolerated all exercises well. She reported L shoulder pain throughout. Soft tissue mobilization was done to the L deltoid, upper trap and paraspinal area which helped to decrease her pain. Pt did fatigue quickly throughout exercises needing rest breaks especially during standing exercises. She needed frequent cueing for posture throughout. While standing pt hangs on Y ligaments needing verbal and tactile cueing to fix.    PT Treatment/Interventions ADLs/Self  Care Home Management;Neuromuscular re-education;Therapeutic exercise;Therapeutic activities;Functional mobility training;Stair training;Gait training;Patient/family education;Manual techniques;Passive range of motion;Scar mobilization    PT Next Visit Plan education on safe return to gym ex's, endurance ex's, and functional LE strengthening. Address thoracic pain as indicated.    PT Home Exercise Plan see pt instructions    Consulted and Agree with Plan of Care Patient             Patient will benefit from skilled therapeutic intervention in order to improve the following deficits and impairments:  Abnormal gait, Difficulty walking, Decreased endurance, Decreased activity tolerance, Impaired flexibility, Decreased strength, Decreased mobility, Postural dysfunction  Visit Diagnosis: Muscle weakness (generalized)  Difficulty in walking, not elsewhere classified  Pain in thoracic spine     Problem List Patient Active Problem List   Diagnosis Date Noted   Dyspareunia in female 11/11/2018   Atrophic vaginitis 11/11/2018   Acute perforated appendicitis 11/11/2018   Acute appendicitis with perforation and localized peritonitis 11/11/2018   Sjogren's syndrome ([place])    Obesity    Diverticulosis of colon  11/10/2012   Hyperlipidemia 02/24/2007   Essential hypertension 02/24/2007    Dawayne Cirri, SPTA 01/23/2021, 10:21 AM  [place]. [place], [place], [address] Phone: [phone]   Fax:  [phone]  Name: MARYJAYNE KLEVEN MRN: [id number] Date of Birth: 1950-12-20

## 2021-01-24 DIAGNOSIS — E559 Vitamin D deficiency, unspecified: Secondary | ICD-10-CM | POA: Diagnosis not present

## 2021-01-24 DIAGNOSIS — I10 Essential (primary) hypertension: Secondary | ICD-10-CM | POA: Diagnosis not present

## 2021-01-24 DIAGNOSIS — Z1159 Encounter for screening for other viral diseases: Secondary | ICD-10-CM | POA: Diagnosis not present

## 2021-01-24 DIAGNOSIS — R8290 Unspecified abnormal findings in urine: Secondary | ICD-10-CM | POA: Diagnosis not present

## 2021-01-24 DIAGNOSIS — Z0000 Encounter for general adult medical examination without abnormal findings: Secondary | ICD-10-CM | POA: Diagnosis not present

## 2021-01-24 DIAGNOSIS — F419 Anxiety disorder, unspecified: Secondary | ICD-10-CM | POA: Diagnosis not present

## 2021-01-24 DIAGNOSIS — D649 Anemia, unspecified: Secondary | ICD-10-CM | POA: Diagnosis not present

## 2021-01-24 DIAGNOSIS — E785 Hyperlipidemia, unspecified: Secondary | ICD-10-CM | POA: Diagnosis not present

## 2021-01-24 DIAGNOSIS — C786 Secondary malignant neoplasm of retroperitoneum and peritoneum: Secondary | ICD-10-CM | POA: Diagnosis not present

## 2021-01-24 DIAGNOSIS — D373 Neoplasm of uncertain behavior of appendix: Secondary | ICD-10-CM | POA: Diagnosis not present

## 2021-01-24 DIAGNOSIS — R634 Abnormal weight loss: Secondary | ICD-10-CM | POA: Diagnosis not present

## 2021-01-24 DIAGNOSIS — Z1389 Encounter for screening for other disorder: Secondary | ICD-10-CM | POA: Diagnosis not present

## 2021-01-24 DIAGNOSIS — L209 Atopic dermatitis, unspecified: Secondary | ICD-10-CM | POA: Diagnosis not present

## 2021-01-24 DIAGNOSIS — Z932 Ileostomy status: Secondary | ICD-10-CM | POA: Diagnosis not present

## 2021-01-27 DIAGNOSIS — Z432 Encounter for attention to ileostomy: Secondary | ICD-10-CM | POA: Diagnosis not present

## 2021-01-28 ENCOUNTER — Other Ambulatory Visit: Payer: Self-pay

## 2021-01-28 ENCOUNTER — Encounter: Payer: Self-pay | Admitting: Physical Therapy

## 2021-01-28 ENCOUNTER — Ambulatory Visit: Payer: PPO | Admitting: Physical Therapy

## 2021-01-28 DIAGNOSIS — R262 Difficulty in walking, not elsewhere classified: Secondary | ICD-10-CM

## 2021-01-28 DIAGNOSIS — M6281 Muscle weakness (generalized): Secondary | ICD-10-CM

## 2021-01-28 DIAGNOSIS — M546 Pain in thoracic spine: Secondary | ICD-10-CM

## 2021-01-28 NOTE — Therapy (Signed)
[place]. [place], [place], [address] Phone: [phone]   Fax:  [phone]  Physical Therapy Treatment  Patient Details  Name: Kristy Sherman MRN: [id number] Date of Birth: 02-07-1951 Referring Provider (PT): Staley   Encounter Date: 01/28/2021   PT End of Session - 01/28/21 1051     Visit Number 4    Date for PT Re-Evaluation 04/02/21    PT Start Time 7616    PT Stop Time 1042    PT Time Calculation (min) 40 min    Activity Tolerance Patient tolerated treatment well    Behavior During Therapy [facility] for tasks assessed/performed             Past Medical History:  Diagnosis Date   ANKLE PAIN, LEFT 10/17/2007   Qualifier: Diagnosis of  By: Jerold Coombe     Hyperlipidemia    Hypertension    Obesity    Sjoegren syndrome     Past Surgical History:  Procedure Laterality Date   ABDOMINAL HYSTERECTOMY     [place] N/A 11/11/2018   Procedure: APPENDECTOMY LAPAROSCOPIC;  Surgeon: Michael Boston, MD;  Location: [facility];  Service: General;  Laterality: N/A;   SPLENECTOMY, TOTAL      There were no vitals filed for this visit.   Subjective Assessment - 01/28/21 1006     Subjective Pt states doing about the same overall. States having some days where pain is improved.    Currently in Pain? Yes    Pain Score 5     Pain Location Back                               [facility] Adult PT Treatment/Exercise - 01/28/21 0001       Lumbar Exercises: Aerobic   UBE (Upper Arm Bike) L2 x 2 min each    Nustep L5 x 5min      Lumbar Exercises: Machines for Strengthening   Other Lumbar Machine Exercise rows and lats 10# 2x10    Other Lumbar Machine Exercise standing shoulder ext 5# 2x10      Lumbar Exercises: Standing   Other Standing Lumbar Exercises step ups 6" 2x10    Other Standing Lumbar Exercises  trunk extension against the wall 2x5 yellow ball, trunk rotation to wall with yellow ball 2x10      Lumbar Exercises: Seated   Sit to Stand 20 reps   2x10 4# chest press   Other Seated Lumbar Exercises abdominal bracing 2x10    Other Seated Lumbar Exercises horizontal abduction yellow tband 2x10                      PT Short Term Goals - 01/21/21 1111       PT SHORT TERM GOAL #1   Title Pt will be I with initial HEP    Time 2    Period Weeks    Status On-going    Target Date 01/22/21               PT Long Term Goals - 01/08/21 1339       PT LONG TERM GOAL #1   Title Pt will be I with advanced HEP    Time 8    Period Weeks    Status  New    Target Date 03/05/21      PT LONG TERM GOAL #2   Title Pt will demo BLE strength 5/5    Time 8    Period Weeks    Status New    Target Date 03/05/21      PT LONG TERM GOAL #3   Title Pt will report able to stand in kitchen to cook meal or >30 min standing    Period Weeks    Status New    Target Date 03/05/21      PT LONG TERM GOAL #4   Title Pt will report able to safely return to [employer]/gym and I with exercise routine    Time 8    Period Weeks    Status New    Target Date 03/05/21                   Plan - 01/28/21 1051     Clinical Impression Statement Progressed pt to machine interventions; pt tolerated well with no increase in mid back pain or L shoulder pain. Cues for upright standing/form with shoulder extension. Was limited with STS w/ chest press d/t L shoulder pain. Kept machine interventions <10 lbs this rx; assess response next rx. Pt reports per MD able to progress weight as tolerated.    PT Treatment/Interventions ADLs/Self Care Home Management;Neuromuscular re-education;Therapeutic exercise;Therapeutic activities;Functional mobility training;Stair training;Gait training;Patient/family education;Manual techniques;Passive range of motion;Scar mobilization    PT Next Visit Plan education on  safe return to gym ex's, endurance ex's, and functional LE strengthening. Address thoracic pain as indicated.    Consulted and Agree with Plan of Care Patient             Patient will benefit from skilled therapeutic intervention in order to improve the following deficits and impairments:  Abnormal gait, Difficulty walking, Decreased endurance, Decreased activity tolerance, Impaired flexibility, Decreased strength, Decreased mobility, Postural dysfunction  Visit Diagnosis: Muscle weakness (generalized)  Difficulty in walking, not elsewhere classified  Pain in thoracic spine     Problem List Patient Active Problem List   Diagnosis Date Noted   Dyspareunia in female 11/11/2018   Atrophic vaginitis 11/11/2018   Acute perforated appendicitis 11/11/2018   Acute appendicitis with perforation and localized peritonitis 11/11/2018   Sjogren's syndrome ([place])    Obesity    Diverticulosis of colon  11/10/2012   Hyperlipidemia 02/24/2007   Essential hypertension 02/24/2007   Amador Cunas, PT, DPT Donald Prose Tagen Brethauer 01/28/2021, 10:53 AM  [place]. [place], [place], [address] Phone: [phone]   Fax:  [phone]  Name: Kristy Sherman MRN: [id number] Date of Birth: 04/03/1951

## 2021-01-30 ENCOUNTER — Ambulatory Visit: Payer: PPO | Admitting: Physical Therapy

## 2021-01-31 DIAGNOSIS — E785 Hyperlipidemia, unspecified: Secondary | ICD-10-CM | POA: Diagnosis not present

## 2021-01-31 DIAGNOSIS — G8929 Other chronic pain: Secondary | ICD-10-CM | POA: Diagnosis not present

## 2021-01-31 DIAGNOSIS — I10 Essential (primary) hypertension: Secondary | ICD-10-CM | POA: Diagnosis not present

## 2021-02-04 ENCOUNTER — Other Ambulatory Visit: Payer: Self-pay

## 2021-02-04 ENCOUNTER — Encounter: Payer: Self-pay | Admitting: Physical Therapy

## 2021-02-04 ENCOUNTER — Ambulatory Visit: Payer: PPO | Admitting: Physical Therapy

## 2021-02-04 DIAGNOSIS — M6281 Muscle weakness (generalized): Secondary | ICD-10-CM | POA: Diagnosis not present

## 2021-02-04 DIAGNOSIS — R262 Difficulty in walking, not elsewhere classified: Secondary | ICD-10-CM

## 2021-02-04 DIAGNOSIS — M546 Pain in thoracic spine: Secondary | ICD-10-CM

## 2021-02-04 NOTE — Therapy (Signed)
[place]. [place], [place], [address] Phone: [phone]   Fax:  [phone]  Physical Therapy Treatment  Patient Details  Name: Kristy Sherman MRN: [id number] Date of Birth: October 19, 1950 Referring Provider (PT): Staley   Encounter Date: 02/04/2021   PT End of Session - 02/04/21 1012     Visit Number 5    Date for PT Re-Evaluation 04/02/21    PT Start Time 0930    PT Stop Time 1010    PT Time Calculation (min) 40 min    Activity Tolerance Patient tolerated treatment well    Behavior During Therapy [facility] for tasks assessed/performed             Past Medical History:  Diagnosis Date   ANKLE PAIN, LEFT 10/17/2007   Qualifier: Diagnosis of  By: Jerold Coombe     Hyperlipidemia    Hypertension    Obesity    Sjoegren syndrome     Past Surgical History:  Procedure Laterality Date   ABDOMINAL HYSTERECTOMY     [place] N/A 11/11/2018   Procedure: APPENDECTOMY LAPAROSCOPIC;  Surgeon: Michael Boston, MD;  [facility];  Service: General;  Laterality: N/A;   SPLENECTOMY, TOTAL      There were no vitals filed for this visit.   Subjective Assessment - 02/04/21 0933     Subjective Pt is doing well. She didnt come in last treatment due to being in too much pain and sore from last treatment.    Currently in Pain? Yes    Pain Score 4     Pain Location Back    Pain Orientation Right                               [facility] Adult PT Treatment/Exercise - 02/04/21 0001       Lumbar Exercises: Aerobic   Recumbent Bike L1.5 x 22mn      Lumbar Exercises: Machines for Strengthening   Other Lumbar Machine Exercise rows and lats 10# 2x10, trunk extension x10    Other Lumbar Machine Exercise AR press 5# x10      Lumbar Exercises: Standing   Other Standing Lumbar Exercises step ups 6" 2x10   2#    Other Standing Lumbar Exercises W's against the wall x10 trunk extension yellow ball x10      Lumbar Exercises: Seated   Sit to Stand 20 reps   2x10 4# chest press, on airex   Other Seated Lumbar Exercises abdominal bracing 2x10    Other Seated Lumbar Exercises horizontal abduction yellow tband 2x10                      PT Short Term Goals - 01/21/21 1111       PT SHORT TERM GOAL #1   Title Pt will be I with initial HEP    Time 2    Period Weeks    Status On-going    Target Date 01/22/21               PT Long Term Goals - 01/08/21 1339       PT LONG TERM GOAL #1   Title Pt will be I with advanced HEP    Time 8    Period Weeks  Status New    Target Date 03/05/21      PT LONG TERM GOAL #2   Title Pt will demo BLE strength 5/5    Time 8    Period Weeks    Status New    Target Date 03/05/21      PT LONG TERM GOAL #3   Title Pt will report able to stand in kitchen to cook meal or >30 min standing    Period Weeks    Status New    Target Date 03/05/21      PT LONG TERM GOAL #4   Title Pt will report able to safely return to [employer]/gym and I with exercise routine    Time 8    Period Weeks    Status New    Target Date 03/05/21                   Plan - 02/04/21 1010     Clinical Impression Statement Pt reports she was having pain after last treatment and was sore. Today we continued to strengthen UE as well as core and some LE. Pt rerpoted shoulder pain during AR press so exercise was stopped. Cotninued to keep machine interventaions <10lbs due to lifting restrictions. Pt required verbal and tactile cueing for abdominal bracing and rows to prevent shoulder hiking.    PT Treatment/Interventions ADLs/Self Care Home Management;Neuromuscular re-education;Therapeutic exercise;Therapeutic activities;Functional mobility training;Stair training;Gait training;Patient/family education;Manual techniques;Passive range of motion;Scar mobilization    PT Next  Visit Plan education on safe return to gym ex's, endurance ex's, and functional LE strengthening. Address thoracic pain as indicated.    PT Home Exercise Plan see pt instructions             Patient will benefit from skilled therapeutic intervention in order to improve the following deficits and impairments:  Abnormal gait, Difficulty walking, Decreased endurance, Decreased activity tolerance, Impaired flexibility, Decreased strength, Decreased mobility, Postural dysfunction  Visit Diagnosis: Muscle weakness (generalized)  Difficulty in walking, not elsewhere classified  Pain in thoracic spine     Problem List Patient Active Problem List   Diagnosis Date Noted   Dyspareunia in female 11/11/2018   Atrophic vaginitis 11/11/2018   Acute perforated appendicitis 11/11/2018   Acute appendicitis with perforation and localized peritonitis 11/11/2018   Sjogren's syndrome ([place])    Obesity    Diverticulosis of colon  11/10/2012   Hyperlipidemia 02/24/2007   Essential hypertension 02/24/2007    Dawayne Cirri, SPTA 02/04/2021, 10:13 AM  [place]. [place], [place], [address] Phone: [phone]   Fax:  [phone]  Name: NIKKOL CONKEL MRN: [id number] Date of Birth: 1951-01-19

## 2021-02-06 ENCOUNTER — Ambulatory Visit: Payer: PPO | Admitting: Physical Therapy

## 2021-02-06 ENCOUNTER — Other Ambulatory Visit: Payer: Self-pay

## 2021-02-06 ENCOUNTER — Encounter: Payer: Self-pay | Admitting: Physical Therapy

## 2021-02-06 DIAGNOSIS — R262 Difficulty in walking, not elsewhere classified: Secondary | ICD-10-CM

## 2021-02-06 DIAGNOSIS — M6281 Muscle weakness (generalized): Secondary | ICD-10-CM | POA: Diagnosis not present

## 2021-02-06 DIAGNOSIS — M546 Pain in thoracic spine: Secondary | ICD-10-CM

## 2021-02-06 NOTE — Therapy (Signed)
[place]. [place], [place], [address] Phone: [phone]   Fax:  [phone]  Physical Therapy Treatment  Patient Details  Name: Kristy Sherman MRN: [id number] Date of Birth: Aug 16, 1950 Referring Provider (PT): Staley   Encounter Date: 02/06/2021   PT End of Session - 02/06/21 1045     Visit Number 6    Date for PT Re-Evaluation 04/02/21    PT Start Time 1000    PT Stop Time 1050    PT Time Calculation (min) 50 min    Activity Tolerance Patient tolerated treatment well    Behavior During Therapy [facility] for tasks assessed/performed             Past Medical History:  Diagnosis Date   ANKLE PAIN, LEFT 10/17/2007   Qualifier: Diagnosis of  By: Jerold Coombe     Hyperlipidemia    Hypertension    Obesity    Sjoegren syndrome     Past Surgical History:  Procedure Laterality Date   ABDOMINAL HYSTERECTOMY     [place] N/A 11/11/2018   Procedure: APPENDECTOMY LAPAROSCOPIC;  Surgeon: Michael Boston, MD;  [facility];  Service: General;  Laterality: N/A;   SPLENECTOMY, TOTAL      There were no vitals filed for this visit.   Subjective Assessment - 02/06/21 1003     Subjective Pt reports doing much better after last tx session.    Currently in Pain? Yes    Pain Score 2     Pain Location Back    Pain Orientation Right                               [facility] Adult PT Treatment/Exercise - 02/06/21 0001       Lumbar Exercises: Aerobic   UBE (Upper Arm Bike) L2 x3 min each    Recumbent Bike L2 x 5 min      Lumbar Exercises: Machines for Strengthening   Other Lumbar Machine Exercise rows and lats 10# 2x10, trunk extension x10      Lumbar Exercises: Standing   Other Standing Lumbar Exercises W backs against wall x10; thoracic extension over pball x10 3 sec hold      Lumbar Exercises:  Seated   Sit to Stand 20 reps   with 4# chest press   Other Seated Lumbar Exercises abdominal bracing 2x10    Other Seated Lumbar Exercises horizontal abduction yellow tband 2x10      Modalities   Modalities Moist Heat      Moist Heat Therapy   Number Minutes Moist Heat 5 Minutes    Moist Heat Location Shoulder                      PT Short Term Goals - 01/21/21 1111       PT SHORT TERM GOAL #1   Title Pt will be I with initial HEP    Time 2    Period Weeks    Status On-going    Target Date 01/22/21               PT Long Term Goals - 01/08/21 1339       PT LONG TERM GOAL #1   Title Pt will be I with advanced HEP  Time 8    Period Weeks    Status New    Target Date 03/05/21      PT LONG TERM GOAL #2   Title Pt will demo BLE strength 5/5    Time 8    Period Weeks    Status New    Target Date 03/05/21      PT LONG TERM GOAL #3   Title Pt will report able to stand in kitchen to cook meal or >30 min standing    Period Weeks    Status New    Target Date 03/05/21      PT LONG TERM GOAL #4   Title Pt will report able to safely return to [employer]/gym and I with exercise routine    Time 8    Period Weeks    Status New    Target Date 03/05/21                   Plan - 02/06/21 1045     Clinical Impression Statement Pt tolerated progression of TE well. Increased soreness with any activities involving L shoulder above shoulder level. Cues to avoid compensation with W backs. Machine interventions <10# d/t lifting restrictions. Pt wanting to learn more machine ex's that she can complete at the gym after discharge.    PT Treatment/Interventions ADLs/Self Care Home Management;Neuromuscular re-education;Therapeutic exercise;Therapeutic activities;Functional mobility training;Stair training;Gait training;Patient/family education;Manual techniques;Passive range of motion;Scar mobilization    PT Next Visit Plan education on safe return to gym ex's,  endurance ex's, and functional LE strengthening. Address thoracic pain as indicated.    Consulted and Agree with Plan of Care Patient             Patient will benefit from skilled therapeutic intervention in order to improve the following deficits and impairments:  Abnormal gait, Difficulty walking, Decreased endurance, Decreased activity tolerance, Impaired flexibility, Decreased strength, Decreased mobility, Postural dysfunction  Visit Diagnosis: Muscle weakness (generalized)  Difficulty in walking, not elsewhere classified  Pain in thoracic spine     Problem List Patient Active Problem List   Diagnosis Date Noted   Dyspareunia in female 11/11/2018   Atrophic vaginitis 11/11/2018   Acute perforated appendicitis 11/11/2018   Acute appendicitis with perforation and localized peritonitis 11/11/2018   Sjogren's syndrome ([place])    Obesity    Diverticulosis of colon  11/10/2012   Hyperlipidemia 02/24/2007   Essential hypertension 02/24/2007   Amador Cunas, PT, DPT Donald Prose Iesha [place] 02/06/2021, 10:47 AM  [place]. [place], [place], [address] Phone: [phone]   Fax:  [phone]  Name: ARIUS PENISTON MRN: [id number] Date of Birth: 1951/04/19

## 2021-02-10 DIAGNOSIS — Z515 Encounter for palliative care: Secondary | ICD-10-CM | POA: Diagnosis not present

## 2021-02-10 DIAGNOSIS — F32A Depression, unspecified: Secondary | ICD-10-CM | POA: Diagnosis not present

## 2021-02-10 DIAGNOSIS — G4700 Insomnia, unspecified: Secondary | ICD-10-CM | POA: Diagnosis not present

## 2021-02-10 DIAGNOSIS — C181 Malignant neoplasm of appendix: Secondary | ICD-10-CM | POA: Diagnosis not present

## 2021-02-11 DIAGNOSIS — M545 Low back pain, unspecified: Secondary | ICD-10-CM | POA: Diagnosis not present

## 2021-02-11 DIAGNOSIS — G8929 Other chronic pain: Secondary | ICD-10-CM | POA: Diagnosis not present

## 2021-02-11 DIAGNOSIS — M25519 Pain in unspecified shoulder: Secondary | ICD-10-CM | POA: Diagnosis not present

## 2021-02-11 DIAGNOSIS — B353 Tinea pedis: Secondary | ICD-10-CM | POA: Diagnosis not present

## 2021-02-11 DIAGNOSIS — M7022 Olecranon bursitis, left elbow: Secondary | ICD-10-CM | POA: Diagnosis not present

## 2021-02-18 ENCOUNTER — Other Ambulatory Visit: Payer: Self-pay

## 2021-02-18 ENCOUNTER — Ambulatory Visit: Payer: PPO | Attending: Nurse Practitioner | Admitting: Physical Therapy

## 2021-02-18 DIAGNOSIS — R262 Difficulty in walking, not elsewhere classified: Secondary | ICD-10-CM | POA: Diagnosis not present

## 2021-02-18 DIAGNOSIS — M6281 Muscle weakness (generalized): Secondary | ICD-10-CM | POA: Diagnosis not present

## 2021-02-18 NOTE — Therapy (Signed)
[place]. [place], [place], [address] Phone: [phone]   Fax:  [phone]  Physical Therapy Treatment  Patient Details  Name: Kristy Sherman MRN: [id number] Date of Birth: August 26, 1950 Referring Provider (PT): Staley   Encounter Date: 02/18/2021   PT End of Session - 02/18/21 1057     Visit Number 7    Date for PT Re-Evaluation 04/02/21    PT Start Time 6294    PT Stop Time 1100    PT Time Calculation (min) 45 min             Past Medical History:  Diagnosis Date   ANKLE PAIN, LEFT 10/17/2007   Qualifier: Diagnosis of  By: Jerold Coombe     Hyperlipidemia    Hypertension    Obesity    Sjoegren syndrome     Past Surgical History:  Procedure Laterality Date   ABDOMINAL HYSTERECTOMY     [place] N/A 11/11/2018   Procedure: APPENDECTOMY LAPAROSCOPIC;  Surgeon: Michael Boston, MD;  [facility];  Service: General;  Laterality: N/A;   SPLENECTOMY, TOTAL      There were no vitals filed for this visit.   Subjective Assessment - 02/18/21 1020     Subjective doing okay this morning. at gym yesterday and walked a mile - very pleased    Currently in Pain? No/denies                               [facility] Adult PT Treatment/Exercise - 02/18/21 0001       Lumbar Exercises: Aerobic   UBE (Upper Arm Bike) L 2 2 min fwd and back    Nustep L 5 8 min      Lumbar Exercises: Machines for Strengthening   Cybex Knee Extension 10# 2 sets 10    Cybex Knee Flexion 20# 2 sets 10    Other Lumbar Machine Exercise rows and lats 20# 2x10, trunk flex/extension x15 black tband                      PT Short Term Goals - 02/18/21 1029       PT SHORT TERM GOAL #1   Title Pt will be I with initial HEP    Status Achieved               PT Long Term Goals - 02/18/21 1029        PT LONG TERM GOAL #1   Title Pt will be I with advanced HEP    Status Partially Met      PT LONG TERM GOAL #2   Title Pt will demo BLE strength 5/5    Status On-going      PT LONG TERM GOAL #3   Title Pt will report able to stand in kitchen to cook meal or >30 min standing    Status Achieved      PT LONG TERM GOAL #4   Title Pt will report able to safely return to [employer]/gym and I with exercise routine    Status Partially Met                   Plan - 02/18/21 1058     Clinical Impression Statement progressing with goals. educ on  gym transition- educ on machines,use and safety with handout given.    PT Treatment/Interventions ADLs/Self Care Home Management;Neuromuscular re-education;Therapeutic exercise;Therapeutic activities;Functional mobility training;Stair training;Gait training;Patient/family education;Manual techniques;Passive range of motion;Scar mobilization    PT Next Visit Plan decrease to 1 x a week to transition to gym             Patient will benefit from skilled therapeutic intervention in order to improve the following deficits and impairments:  Abnormal gait, Difficulty walking, Decreased endurance, Decreased activity tolerance, Impaired flexibility, Decreased strength, Decreased mobility, Postural dysfunction  Visit Diagnosis: Muscle weakness (generalized)     Problem List Patient Active Problem List   Diagnosis Date Noted   Dyspareunia in female 11/11/2018   Atrophic vaginitis 11/11/2018   Acute perforated appendicitis 11/11/2018   Acute appendicitis with perforation and localized peritonitis 11/11/2018   Sjogren's syndrome ([place])    Obesity    Diverticulosis of colon  11/10/2012   Hyperlipidemia 02/24/2007   Essential hypertension 02/24/2007    Kristy Sherman,ANGIE PTA 02/18/2021, 10:59 AM  [place]. [place], [place], [address] Phone: [phone]   Fax:  [phone]  Name:  Kristy Sherman MRN: [id number] Date of Birth: [birth year]

## 2021-02-20 ENCOUNTER — Ambulatory Visit: Payer: PPO | Admitting: Physical Therapy

## 2021-02-27 ENCOUNTER — Ambulatory Visit: Payer: PPO | Admitting: Physical Therapy

## 2021-02-27 ENCOUNTER — Other Ambulatory Visit: Payer: Self-pay

## 2021-02-27 DIAGNOSIS — M6281 Muscle weakness (generalized): Secondary | ICD-10-CM

## 2021-02-27 DIAGNOSIS — R262 Difficulty in walking, not elsewhere classified: Secondary | ICD-10-CM

## 2021-02-27 NOTE — Therapy (Signed)
[place]. [place], [place], [address] Phone: [phone]   Fax:  [phone]  Physical Therapy Treatment  Patient Details  Name: Kristy Sherman MRN: [id number] Date of Birth: February 10, 1951 Referring Provider (PT): Staley   Encounter Date: 02/27/2021   PT End of Session - 02/27/21 1523     Visit Number 8    Date for PT Re-Evaluation 04/02/21    PT Start Time 8466    PT Stop Time 5993    PT Time Calculation (min) 45 min             Past Medical History:  Diagnosis Date   ANKLE PAIN, LEFT 10/17/2007   Qualifier: Diagnosis of  By: Jerold Coombe     Hyperlipidemia    Hypertension    Obesity    Sjoegren syndrome     Past Surgical History:  Procedure Laterality Date   ABDOMINAL HYSTERECTOMY     [place] N/A 11/11/2018   Procedure: APPENDECTOMY LAPAROSCOPIC;  Surgeon: Michael Boston, MD;  [facility];  Service: General;  Laterality: N/A;   SPLENECTOMY, TOTAL      There were no vitals filed for this visit.   Subjective Assessment - 02/27/21 1450     Subjective overall doing well. been to gym but need to schedule with trainer to assure I am doing it right    Currently in Pain? No/denies                               [facility] Adult PT Treatment/Exercise - 02/27/21 0001       Lumbar Exercises: Aerobic   UBE (Upper Arm Bike) L 4 2 min fwd/2 min backward    Nustep L 6 8 min      Lumbar Exercises: Machines for Strengthening   Cybex Lumbar Extension black tband flex and ext 20 each    Other Lumbar Machine Exercise rows and lats 20# 2x10, trunk flex/extension x15 black tband      Lumbar Exercises: Standing   Row Strengthening;Power tower;Both;10 reps   20#   Shoulder Extension Strengthening;Power Tower;Both;10 reps   20#   Other Standing Lumbar Exercises chest press power tower 10 # 10  x   3# press with leg mvmt on airex 3 sets   Other Standing Lumbar Exercises resisted gait 5 x 4 way 30#      Lumbar Exercises: Seated   Sit to Stand 20 reps   wt ball                     PT Short Term Goals - 02/18/21 1029       PT SHORT TERM GOAL #1   Title Pt will be I with initial HEP    Status Achieved               PT Long Term Goals - 02/27/21 1506       PT LONG TERM GOAL #1   Title Pt will be I with advanced HEP    Baseline progressing at gym, scheduling with a trainer to assure using machines correcty    Status Partially Met      PT LONG TERM GOAL #2   Title Pt will demo BLE strength 5/5    Status Partially Met  PT LONG TERM GOAL #3   Title Pt will report able to stand in kitchen to cook meal or >30 min standing    Status Achieved      PT LONG TERM GOAL #4   Title Pt will report able to safely return to [employer]/gym and I with exercise routine    Status Partially Met                   Plan - 02/27/21 1523     Clinical Impression Statement progressing with goals. pt has been at gym and awaiting appt with trainer to assure using machines correctly.added combo ex today and pt was more fatigued    PT Treatment/Interventions ADLs/Self Care Home Management;Neuromuscular re-education;Therapeutic exercise;Therapeutic activities;Functional mobility training;Stair training;Gait training;Patient/family education;Manual techniques;Passive range of motion;Scar mobilization    PT Next Visit Plan decrease to 1 x a week to transition to gym             Patient will benefit from skilled therapeutic intervention in order to improve the following deficits and impairments:  Abnormal gait, Difficulty walking, Decreased endurance, Decreased activity tolerance, Impaired flexibility, Decreased strength, Decreased mobility, Postural dysfunction  Visit Diagnosis: Muscle weakness (generalized)  Difficulty in walking, not elsewhere  classified     Problem List Patient Active Problem List   Diagnosis Date Noted   Dyspareunia in female 11/11/2018   Atrophic vaginitis 11/11/2018   Acute perforated appendicitis 11/11/2018   Acute appendicitis with perforation and localized peritonitis 11/11/2018   Sjogren's syndrome ([place])    Obesity    Diverticulosis of colon  11/10/2012   Hyperlipidemia 02/24/2007   Essential hypertension 02/24/2007    Delma Drone,ANGIE PTA 02/27/2021, 3:25 PM  [place]. [place], [place], [address] Phone: [phone]   Fax:  [phone]  Name: Kristy Sherman MRN: [id number] Date of Birth: 1950/12/17

## 2021-03-05 DIAGNOSIS — Z432 Encounter for attention to ileostomy: Secondary | ICD-10-CM | POA: Diagnosis not present

## 2021-03-06 ENCOUNTER — Ambulatory Visit: Payer: PPO | Admitting: Physical Therapy

## 2021-03-06 ENCOUNTER — Other Ambulatory Visit: Payer: Self-pay

## 2021-03-06 DIAGNOSIS — M6281 Muscle weakness (generalized): Secondary | ICD-10-CM

## 2021-03-06 NOTE — Therapy (Signed)
[place]. [place], [place], [address] Phone: [phone]   Fax:  [phone]  Physical Therapy Treatment  Patient Details  Name: Kristy Sherman MRN: [id number] Date of Birth: [birth year] Referring Provider (PT): Staley   Encounter Date: 03/06/2021   PT End of Session - 03/06/21 1216     Visit Number 9    Date for PT Re-Evaluation 04/02/21    PT Start Time 9767    PT Stop Time 1225    PT Time Calculation (min) 40 min             Past Medical History:  Diagnosis Date   ANKLE PAIN, LEFT 10/17/2007   Qualifier: Diagnosis of  By: Jerold Coombe     Hyperlipidemia    Hypertension    Obesity    Sjoegren syndrome     Past Surgical History:  Procedure Laterality Date   ABDOMINAL HYSTERECTOMY     [place] N/A 11/11/2018   Procedure: APPENDECTOMY LAPAROSCOPIC;  Surgeon: Michael Boston, MD;  Location: [facility];  Service: General;  Laterality: N/A;   SPLENECTOMY, TOTAL      There were no vitals filed for this visit.   Subjective Assessment - 03/06/21 1147     Subjective already walked a mile this morning and back to chair yoga. still need to schedule with trainer for wts    Currently in Pain? No/denies                               [facility] Adult PT Treatment/Exercise - 03/06/21 0001       Lumbar Exercises: Aerobic   Nustep L 6 8 min      Lumbar Exercises: Machines for Strengthening   Cybex Lumbar Extension black tband flex and ext 20 each    Cybex Knee Extension 10# 2 sets 10    Cybex Knee Flexion 20# 2 sets 10    Leg Press 40# 2 sets 10    Other Lumbar Machine Exercise rows and lats 25# 10 each and then dropped back to 20# 2nd set      Lumbar Exercises: Standing   Other Standing Lumbar Exercises 10# cablel pulley hip 4 way 10 each                      PT Short Term  Goals - 02/18/21 1029       PT SHORT TERM GOAL #1   Title Pt will be I with initial HEP    Status Achieved               PT Long Term Goals - 03/06/21 1151       PT LONG TERM GOAL #1   Title Pt will be I with advanced HEP    Baseline progressing at gym, scheduling with a trainer to assure using machines correcty    Status Partially Met      PT LONG TERM GOAL #2   Title Pt will demo BLE strength 5/5    Status Partially Met      PT LONG TERM GOAL #3   Title Pt will report able to stand in kitchen to cook meal or >30 min standing    Status Achieved      PT LONG TERM GOAL #4  Title Pt will report able to safely return to [employer]/gym and I with exercise routine    Status Partially Met                   Plan - 03/06/21 1216     Clinical Impression Statement added leg press and hip ex today ( cuing needed for spee dand control) added UE wt but only tolerated one set. pt up to walking 1 mile and back to chair yoga. still awaiting to get with [facility] staff for [facility] machines. progressing with goals.    PT Treatment/Interventions ADLs/Self Care Home Management;Neuromuscular re-education;Therapeutic exercise;Therapeutic activities;Functional mobility training;Stair training;Gait training;Patient/family education;Manual techniques;Passive range of motion;Scar mobilization    PT Next Visit Plan decrease to 1 x a week to transition to gym             Patient will benefit from skilled therapeutic intervention in order to improve the following deficits and impairments:  Abnormal gait, Difficulty walking, Decreased endurance, Decreased activity tolerance, Impaired flexibility, Decreased strength, Decreased mobility, Postural dysfunction  Visit Diagnosis: Muscle weakness (generalized)     Problem List Patient Active Problem List   Diagnosis Date Noted   Dyspareunia in female 11/11/2018   Atrophic vaginitis 11/11/2018   Acute perforated appendicitis 11/11/2018   Acute  appendicitis with perforation and localized peritonitis 11/11/2018   Sjogren's syndrome ([place])    Obesity    Diverticulosis of colon  11/10/2012   Hyperlipidemia 02/24/2007   Essential hypertension 02/24/2007    Kristy Sherman,ANGIE PTA 03/06/2021, 12:19 PM  [place]. [place], [place], [address] Phone: [phone]   Fax:  [phone]  Name: Kristy Sherman MRN: [id number] Date of Birth: 02-Jul-1951

## 2021-03-13 ENCOUNTER — Other Ambulatory Visit: Payer: Self-pay

## 2021-03-13 ENCOUNTER — Ambulatory Visit: Payer: PPO | Attending: Nurse Practitioner | Admitting: Physical Therapy

## 2021-03-13 DIAGNOSIS — M546 Pain in thoracic spine: Secondary | ICD-10-CM | POA: Diagnosis not present

## 2021-03-13 DIAGNOSIS — M6281 Muscle weakness (generalized): Secondary | ICD-10-CM | POA: Insufficient documentation

## 2021-03-13 NOTE — Therapy (Signed)
[place]. [place], [place], [address] Phone: [phone]   Fax:  [phone]  Physical Therapy Treatment 10th visit Progress Note Reporting Period 01/08/21 to 03/13/21  See note below for Objective Data and Assessment of Progress/Goals.      Patient Details  Name: Kristy Sherman MRN: [id number] Date of Birth: [birth year] Referring Provider (PT): Staley   Encounter Date: 03/13/2021   PT End of Session - 03/13/21 0959     Visit Number 10    Date for PT Re-Evaluation 04/02/21    PT Start Time 0930    PT Stop Time 8828    PT Time Calculation (min) 45 min             Past Medical History:  Diagnosis Date   ANKLE PAIN, LEFT 10/17/2007   Qualifier: Diagnosis of  By: Jerold Coombe     Hyperlipidemia    Hypertension    Obesity    Sjoegren syndrome     Past Surgical History:  Procedure Laterality Date   ABDOMINAL HYSTERECTOMY     [place] N/A 11/11/2018   Procedure: APPENDECTOMY LAPAROSCOPIC;  Surgeon: Michael Boston, MD;  Location: [facility];  Service: General;  Laterality: N/A;   SPLENECTOMY, TOTAL      There were no vitals filed for this visit.   Subjective Assessment - 03/13/21 0937     Subjective walking is improving with time. " I think I pulled back with chair yoga had to take pain pill last night and cant remember last time I had to do that"    Currently in Pain? Yes    Pain Score 3     Pain Location Back    Pain Orientation Left;Upper                               [facility] Adult PT Treatment/Exercise - 03/13/21 0001       Lumbar Exercises: Aerobic   Tread Mill SW 1.5 min each side    Nustep L 6 8 min      Lumbar Exercises: Machines for Strengthening   Cybex Lumbar Extension black tband flex and ext 20 each    Cybex Knee Extension 10# 3 sets 10    Cybex Knee Flexion 20# 3  sets 10    Leg Press 40# 3  sets 10    Other Lumbar Machine Exercise rows and lats 25# 2 sets 10    Other Lumbar Machine Exercise AR press, circles and trunk rotation with pulleys                      PT Short Term Goals - 02/18/21 1029       PT SHORT TERM GOAL #1   Title Pt will be I with initial HEP    Status Achieved               PT Long Term Goals - 03/13/21 0955       PT LONG TERM GOAL #1   Title Pt will be I with advanced HEP    Baseline progressing at gym, scheduling with a trainer to assure using machines correcty    Status Partially Met      PT LONG TERM GOAL #2   Title Pt will demo BLE  strength 5/5    Baseline tests well but does fatigue quickly    Status Partially Met      PT LONG TERM GOAL #3   Title Pt will report able to stand in kitchen to cook meal or >30 min standing    Status Achieved      PT LONG TERM GOAL #4   Title Pt will report able to safely return to [employer]/gym and I with exercise routine    Status Partially Met                   Plan - 03/13/21 0956     Clinical Impression Statement continue to add wts and reps to ex for continued strengthening. pt did arrive with some left upper back pain but did well with interventions and no increase in pain.pt strength is improving as noted by increased wt and reps but does fatigue and need breaks. pt still [facility] and waiting to get with trainer to assure correct machine use.progressing towards LTGs    PT Treatment/Interventions ADLs/Self Care Home Management;Neuromuscular re-education;Therapeutic exercise;Therapeutic activities;Functional mobility training;Stair training;Gait training;Patient/family education;Manual techniques;Passive range of motion;Scar mobilization    PT Next Visit Plan decrease to 1 x a week to transition to gym             Patient will benefit from skilled therapeutic intervention in order to improve the following deficits and  impairments:  Abnormal gait, Difficulty walking, Decreased endurance, Decreased activity tolerance, Impaired flexibility, Decreased strength, Decreased mobility, Postural dysfunction  Visit Diagnosis: Muscle weakness (generalized)  Pain in thoracic spine     Problem List Patient Active Problem List   Diagnosis Date Noted   Dyspareunia in female 11/11/2018   Atrophic vaginitis 11/11/2018   Acute perforated appendicitis 11/11/2018   Acute appendicitis with perforation and localized peritonitis 11/11/2018   Sjogren's syndrome ([place])    Obesity    Diverticulosis of colon  11/10/2012   Hyperlipidemia 02/24/2007   Essential hypertension 02/24/2007   Hall Busing, PT, DPT  Enza Shone,ANGIE PTA 03/13/2021, 9:59 AM  [place]. [place], [place], [address] Phone: [phone]   Fax:  [phone]  Name: KYRA LAFFEY MRN: [id number] Date of Birth: 02-26-1951

## 2021-03-14 DIAGNOSIS — G8929 Other chronic pain: Secondary | ICD-10-CM | POA: Diagnosis not present

## 2021-03-14 DIAGNOSIS — M25512 Pain in left shoulder: Secondary | ICD-10-CM | POA: Diagnosis not present

## 2021-03-14 DIAGNOSIS — D373 Neoplasm of uncertain behavior of appendix: Secondary | ICD-10-CM | POA: Diagnosis not present

## 2021-03-14 DIAGNOSIS — M545 Low back pain, unspecified: Secondary | ICD-10-CM | POA: Diagnosis not present

## 2021-03-20 ENCOUNTER — Ambulatory Visit: Payer: PPO | Admitting: Physical Therapy

## 2021-03-20 ENCOUNTER — Other Ambulatory Visit: Payer: Self-pay

## 2021-03-20 DIAGNOSIS — M6281 Muscle weakness (generalized): Secondary | ICD-10-CM

## 2021-03-20 DIAGNOSIS — M546 Pain in thoracic spine: Secondary | ICD-10-CM

## 2021-03-20 NOTE — Therapy (Signed)
[facility] [address] [place], [place], [address] Phone: [phone]   Fax:  [phone]  Physical Therapy Treatment  Patient Details  Name: Kristy Sherman MRN: [id number] Date of Birth: 08/15/1950 Referring Provider (PT): Staley   Encounter Date: 03/20/2021   PT End of Session - 03/20/21 0955     Visit Number 11    Date for PT Re-Evaluation 04/02/21    PT Start Time 0930    PT Stop Time 1015    PT Time Calculation (min) 45 min             Past Medical History:  Diagnosis Date   ANKLE PAIN, LEFT 10/17/2007   Qualifier: Diagnosis of  By: Lowne DO, Yvonne     Hyperlipidemia    Hypertension    Obesity    Sjoegren syndrome     Past Surgical History:  Procedure Laterality Date   ABDOMINAL HYSTERECTOMY     ABDOMINAL SURGERY     CESAREAN SECTION  1980, 1982, 1986   CHOLECYSTECTOMY     LAPAROSCOPIC APPENDECTOMY N/A 11/11/2018   Procedure: APPENDECTOMY LAPAROSCOPIC;  Surgeon: Gross, Steven, MD;  Location: [facility];  Service: General;  Laterality: N/A;   SPLENECTOMY, TOTAL      There were no vitals filed for this visit.   Subjective Assessment - 03/20/21 0932     Subjective I was OOT and it fatigued me alittle    Currently in Pain? Yes    Pain Score 4     Pain Location Back    Pain Orientation Left;Upper                               [facility] Adult PT Treatment/Exercise - 03/20/21 0001       Lumbar Exercises: Aerobic   UBE (Upper Arm Bike) L 4 2 min fwd/2 min backward    Nustep L 6 8 min      Lumbar Exercises: Machines for Strengthening   Cybex Lumbar Extension black tband flex and ext 20 each    Cybex Knee Extension 10# 3 sets 10    Cybex Knee Flexion 20# 3 sets 10    Leg Press 40# 3  sets 10    Other Lumbar Machine Exercise rows and lats 25# 2 sets 10      Lumbar Exercises: Seated   Sit to Stand 20 reps   wt ball 10 x OH press, 10x chest press     Manual Therapy   Manual Therapy  Soft tissue mobilization;Myofascial release    Soft tissue mobilization L Rhomboid    Myofascial Release DN to L Rhomboid              Trigger Point Dry Needling - 03/20/21 0001     Consent Given? Yes    Muscles Treated Upper Quadrant Rhomboids   L side   Rhomboids Response Twitch response elicited                     PT Short Term Goals - 02/18/21 1029       PT SHORT TERM GOAL #1   Title Pt will be I with initial HEP    Status Achieved               PT Long Term Goals - 03/20/21 0956       PT LONG TERM GOAL #1   Title Pt will   be I with advanced HEP    Status Partially Met      PT LONG TERM GOAL #2   Title Pt will demo BLE strength 5/5    Status Achieved      PT LONG TERM GOAL #3   Title Pt will report able to stand in kitchen to cook meal or >30 min standing    Status Achieved      PT LONG TERM GOAL #4   Title Pt will report able to safely return to [employer]/gym and I with exercise routine    Status Partially Met                   Plan - 03/20/21 0956     Clinical Impression Statement not at gym much since last session d/t OOT. still c/o left upper back pain - TP left rhom so tried DN to see if helps with pain. progressing ex and working to independant gym program    PT Treatment/Interventions ADLs/Self Care Home Management;Neuromuscular re-education;Therapeutic exercise;Therapeutic activities;Functional mobility training;Stair training;Gait training;Patient/family education;Manual techniques;Passive range of motion;Scar mobilization    PT Next Visit Plan assess DN and progress             Patient will benefit from skilled therapeutic intervention in order to improve the following deficits and impairments:  Abnormal gait, Difficulty walking, Decreased endurance, Decreased activity tolerance, Impaired flexibility, Decreased strength, Decreased mobility, Postural dysfunction  Visit Diagnosis: Muscle weakness (generalized)  Pain in  thoracic spine     Problem List Patient Active Problem List   Diagnosis Date Noted   Dyspareunia in female 11/11/2018   Atrophic vaginitis 11/11/2018   Acute perforated appendicitis 11/11/2018   Acute appendicitis with perforation and localized peritonitis 11/11/2018   Sjogren's syndrome ([facility])    Obesity    Diverticulosis of colon  11/10/2012   Hyperlipidemia 02/24/2007   Essential hypertension 02/24/2007    PAYSEUR,ANGIE, PTA 03/20/2021, 10:24 AM  [facility] [address] [place], [place], [address] Phone: [phone]   Fax:  [phone]  Name: Kristy Sherman MRN: [id number] Date of Birth: 10/21/1950    

## 2021-03-27 ENCOUNTER — Ambulatory Visit: Payer: PPO | Admitting: Physical Therapy

## 2021-03-27 ENCOUNTER — Other Ambulatory Visit: Payer: Self-pay

## 2021-03-27 DIAGNOSIS — M6281 Muscle weakness (generalized): Secondary | ICD-10-CM | POA: Diagnosis not present

## 2021-03-27 DIAGNOSIS — M546 Pain in thoracic spine: Secondary | ICD-10-CM

## 2021-03-27 NOTE — Therapy (Signed)
[place]. [place], [place], [address] Phone: [phone]   Fax:  [phone]  Physical Therapy Treatment and Discharge Summary  Patient Details  Name: Kristy Sherman MRN: [id number] Date of Birth: [birth year] Referring Provider (PT): Staley   Encounter Date: 03/27/2021   PT End of Session - 03/27/21 0924     Visit Number 12    Date for PT Re-Evaluation 04/02/21    PT Start Time 0915    PT Stop Time 1000    PT Time Calculation (min) 45 min             Past Medical History:  Diagnosis Date   ANKLE PAIN, LEFT 10/17/2007   Qualifier: Diagnosis of  By: Jerold Coombe     Hyperlipidemia    Hypertension    Obesity    Sjoegren syndrome     Past Surgical History:  Procedure Laterality Date   ABDOMINAL HYSTERECTOMY     [place] N/A 11/11/2018   Procedure: APPENDECTOMY LAPAROSCOPIC;  Surgeon: Michael Boston, MD;  Location: [facility];  Service: General;  Laterality: N/A;   SPLENECTOMY, TOTAL      There were no vitals filed for this visit.   Subjective Assessment - 03/27/21 0918     Subjective pain has moved down in back,surgeon is ordering xrays. walking and at gym " trying wts,emailed trainer"    Currently in Pain? Yes    Pain Score 4     Pain Location Back    Pain Orientation Left;Mid                               [facility] Adult PT Treatment/Exercise - 03/27/21 0001       Lumbar Exercises: Aerobic   Nustep L 6 8 min      Lumbar Exercises: Machines for Strengthening   Cybex Lumbar Extension black tband flex and ext 20 each    Cybex Knee Extension 10# 3 sets 10    Cybex Knee Flexion 20# 3 sets 10    Leg Press 40# 3  sets 10    Other Lumbar Machine Exercise rows and lats 25# 2 sets 10    Other Lumbar Machine Exercise AR press, circles and trunk rotation with pulleys                        PT Short Term Goals - 02/18/21 1029       PT SHORT TERM GOAL #1   Title Pt will be I with initial HEP    Status Achieved               PT Long Term Goals - 03/27/21 0923       PT LONG TERM GOAL #1   Title Pt will be I with advanced HEP    Status Achieved      PT LONG TERM GOAL #2   Title Pt will demo BLE strength 5/5    Status Achieved      PT LONG TERM GOAL #3   Title Pt will report able to stand in kitchen to cook meal or >30 min standing    Status Achieved      PT LONG TERM GOAL #4   Title Pt will report able to safely return to  [employer]/gym and I with exercise routine    Status Achieved                   Plan - 03/27/21 0924     Clinical Impression Statement all goals met. pt has returned to walking and at gym    PT Treatment/Interventions ADLs/Self Care Home Management;Neuromuscular re-education;Therapeutic exercise;Therapeutic activities;Functional mobility training;Stair training;Gait training;Patient/family education;Manual techniques;Passive range of motion;Scar mobilization    PT Next Visit Plan D/C             Patient will benefit from skilled therapeutic intervention in order to improve the following deficits and impairments:  Abnormal gait, Difficulty walking, Decreased endurance, Decreased activity tolerance, Impaired flexibility, Decreased strength, Decreased mobility, Postural dysfunction  Visit Diagnosis: Muscle weakness (generalized)  Pain in thoracic spine     Problem List Patient Active Problem List   Diagnosis Date Noted   Dyspareunia in female 11/11/2018   Atrophic vaginitis 11/11/2018   Acute perforated appendicitis 11/11/2018   Acute appendicitis with perforation and localized peritonitis 11/11/2018   Sjogren's syndrome ([place])    Obesity    Diverticulosis of colon  11/10/2012   Hyperlipidemia 02/24/2007   Essential hypertension 02/24/2007   PHYSICAL THERAPY DISCHARGE SUMMARY   Patient agrees  to discharge. Patient goals were met. Patient is being discharged due to meeting the stated rehab goals.  [name],ANGIE, PTA 03/27/2021, 9:34 AM  Juel Burrow, PT, DPT 03/27/2021, 09:39 AM  [place]. [place], [place], [address] Phone: [phone]   Fax:  [phone]  Name: Kristy Sherman MRN: [id number] Date of Birth: 1951-06-09

## 2021-04-07 DIAGNOSIS — Z432 Encounter for attention to ileostomy: Secondary | ICD-10-CM | POA: Diagnosis not present

## 2021-04-08 DIAGNOSIS — M25512 Pain in left shoulder: Secondary | ICD-10-CM | POA: Diagnosis not present

## 2021-04-09 DIAGNOSIS — C181 Malignant neoplasm of appendix: Secondary | ICD-10-CM | POA: Diagnosis not present

## 2021-04-09 DIAGNOSIS — K8689 Other specified diseases of pancreas: Secondary | ICD-10-CM | POA: Diagnosis not present

## 2021-04-09 DIAGNOSIS — Z23 Encounter for immunization: Secondary | ICD-10-CM | POA: Diagnosis not present

## 2021-04-09 DIAGNOSIS — D373 Neoplasm of uncertain behavior of appendix: Secondary | ICD-10-CM | POA: Diagnosis not present

## 2021-04-09 DIAGNOSIS — Z79899 Other long term (current) drug therapy: Secondary | ICD-10-CM | POA: Diagnosis not present

## 2021-04-09 DIAGNOSIS — K56609 Unspecified intestinal obstruction, unspecified as to partial versus complete obstruction: Secondary | ICD-10-CM | POA: Diagnosis not present

## 2021-04-15 ENCOUNTER — Encounter: Payer: Self-pay | Admitting: Endocrinology

## 2021-04-15 ENCOUNTER — Other Ambulatory Visit: Payer: Self-pay

## 2021-04-15 ENCOUNTER — Ambulatory Visit (INDEPENDENT_AMBULATORY_CARE_PROVIDER_SITE_OTHER): Payer: PPO | Admitting: Endocrinology

## 2021-04-15 LAB — VITAMIN D 25 HYDROXY (VIT D DEFICIENCY, FRACTURES): VITD: 33.04 ng/mL (ref 30.00–100.00)

## 2021-04-15 LAB — TSH: TSH: 1.91 u[IU]/mL (ref 0.35–5.50)

## 2021-04-15 NOTE — Patient Instructions (Signed)
Blood tests are requested for you today.  We'll let you know about the results.  Please continue the same Vitamin-D pills.

## 2021-04-15 NOTE — Progress Notes (Signed)
Subjective:    Patient ID: Kristy Sherman, female    DOB: 1951/07/03, 70 y.o.   MRN: [id number]  HPI Pt is referred by Dr Fara Olden, for hypercalcemia.  Pt was noted to have hypercalcemia in mid-2022 (it was low in early 2022).  she has never had osteoporosis, urolithiasis, thyroid probs, parathyroid probs, sarcoidosis, PUD, pancreatitis, recent severe injury, or bony fracture.  He does not take vitamin-A supplement.  Pt denies taking antacids, Li++, or HCTZ.  She takes Vit-D, 2000 units/d.  She has lost 50 lbs x 1 year.   Past Medical History:  Diagnosis Date   ANKLE PAIN, LEFT 10/17/2007   Qualifier: Diagnosis of  By: Jerold Coombe     Hyperlipidemia    Hypertension    Obesity    Sjoegren syndrome     Past Surgical History:  Procedure Laterality Date   ABDOMINAL HYSTERECTOMY     [place], [place] N/A 11/11/2018   Procedure: APPENDECTOMY LAPAROSCOPIC;  Surgeon: Michael Boston, MD;  Location: [facility];  Service: General;  Laterality: N/A;   SPLENECTOMY, TOTAL      Social History   Socioeconomic History   Marital status: Married    Spouse name: Not on file   Number of children: Not on file   Years of education: Not on file   Highest education level: Not on file  Occupational History   Not on file  Tobacco Use   Smoking status: Never   Smokeless tobacco: Never  Substance and Sexual Activity   Alcohol use: Yes    Alcohol/week: 2.0 standard drinks    Types: 2 Glasses of wine per week   Drug use: No   Sexual activity: Yes    Birth control/protection: Surgical, Post-menopausal    Comment: BTL  Other Topics Concern   Not on file  Social History Narrative   Nurse retired 2019 from [employer] Strain: Not on file  Food Insecurity: Not on file  Transportation Needs: Not on file  Physical Activity: Not on file  Stress: Not on file   Social Connections: Not on file  Intimate Partner Violence: Not on file    Current Outpatient Medications on File Prior to Visit  Medication Sig Dispense Refill   amLODipine (NORVASC) 10 MG tablet Take 10 mg by mouth daily.     Aspirin (BAYER LOW DOSE PO) Take by mouth.     atorvastatin (LIPITOR) 10 MG tablet Take 10 mg by mouth daily.     hydrochlorothiazide (HYDRODIURIL) 25 MG tablet Take 25 mg by mouth daily.     hyoscyamine (LEVSIN SL) 0.125 MG SL tablet Place 1 tablet (0.125 mg total) under the tongue every 4 (four) hours as needed for cramping (abdominal pain). 30 tablet 0   lidocaine (LIDODERM) 5 % Place 1 patch onto the skin daily. Remove & Discard patch within 12 hours or as directed by MD 15 patch 0   ondansetron (ZOFRAN ODT) 4 MG disintegrating tablet Take 1 tablet (4 mg total) by mouth every 8 (eight) hours as needed. 20 tablet 0   oxyCODONE-acetaminophen (PERCOCET/ROXICET) 5-325 MG tablet Take 1 tablet by mouth every 6 (six) hours as needed for severe pain. 10 tablet 0   promethazine (PHENERGAN) 25 MG suppository Place 1 suppository (25 mg total) rectally every 6 (six) hours as needed for nausea or vomiting.  12 each 0   sucralfate (CARAFATE) 1 GM/10ML suspension Take 10 mLs (1 g total) by mouth 4 (four) times daily -  with meals and at bedtime. 420 mL 0   traMADol (ULTRAM) 50 MG tablet Take 1-2 tablets (50-100 mg total) by mouth every 6 (six) hours as needed for moderate pain or severe pain. 20 tablet 0   No current facility-administered medications on file prior to visit.    No Known Allergies  Family History  Problem Relation Age of Onset   Colon cancer Maternal Grandmother 35   Asthma Other    Hypertension Other    Hypercalcemia Neg Hx     BP 118/60 (BP Location: Right Arm, Patient Position: Sitting, Cuff Size: Large)   Pulse 69   Ht 5\' 5"  (1.651 m)   Wt 168 lb (76.2 kg)   SpO2 99%   BMI 27.96 kg/m      Review of Systems Denies polyuria and back pain.   Depression is well-controlled.      Objective:   Physical Exam VS: see vs page GEN: no distress HEAD: head: no deformity eyes: no periorbital swelling, no proptosis external nose and ears are normal NECK: supple, thyroid is not enlarged CHEST WALL: no kyphosis.  LUNGS: clear to auscultation CV: reg rate and rhythm, no murmur.  MUSCULOSKELETAL: gait is normal and steady EXTEMITIES: no deformity.  no leg edema NEURO:  readily moves all 4's.  sensation is intact to touch on all 4's SKIN:  Normal texture and temperature.  No rash or suspicious lesion is visible.   NODES:  None palpable at the neck.   PSYCH: alert, well-oriented.  Does not appear anxious nor depressed.    Vit-D=normal.  Lab Results  Component Value Date   CALCIUM 10.1 09/13/2020   I have reviewed outside records, and summarized:  Pt was noted to have elevated Ca++, and referred here.  As pt has abd malignancy, question of this as a cause of hypercalcemia was raised.       Assessment & Plan:  Hypercalcemia, new to me, uncertain etiology and prognosis.  Vit-D def: well-controlled   Patient Instructions  Blood tests are requested for you today.  We'll let you know about the results.  Please continue the same Vitamin-D pills.

## 2021-04-22 ENCOUNTER — Other Ambulatory Visit: Payer: Self-pay | Admitting: Endocrinology

## 2021-04-22 DIAGNOSIS — R778 Other specified abnormalities of plasma proteins: Secondary | ICD-10-CM | POA: Insufficient documentation

## 2021-04-22 LAB — PTH, INTACT AND CALCIUM
Calcium: 10.5 mg/dL — ABNORMAL HIGH (ref 8.6–10.4)
PTH: 29 pg/mL (ref 16–77)

## 2021-04-22 LAB — VITAMIN D 1,25 DIHYDROXY
Vitamin D 1, 25 (OH)2 Total: 44 pg/mL (ref 18–72)
Vitamin D2 1, 25 (OH)2: <8 pg/mL
Vitamin D3 1, 25 (OH)2: 44 pg/mL

## 2021-04-22 LAB — PROTEIN ELECTROPHORESIS, SERUM
Albumin ELP: 4.2 g/dL (ref 3.8–4.8)
Alpha 1: 0.3 g/dL (ref 0.2–0.3)
Alpha 2: 0.7 g/dL (ref 0.5–0.9)
Beta 2: 0.6 g/dL — ABNORMAL HIGH (ref 0.2–0.5)
Beta Globulin: 0.6 g/dL (ref 0.4–0.6)
Gamma Globulin: 1.6 g/dL (ref 0.8–1.7)
Total Protein: 7.8 g/dL (ref 6.1–8.1)

## 2021-04-22 LAB — PTH-RELATED PEPTIDE: PTH-Related Protein (PTH-RP): 17 pg/mL (ref 11–20)

## 2021-04-22 LAB — VITAMIN A: Vitamin A (Retinoic Acid): 60 ug/dL (ref 38–98)

## 2021-04-25 DIAGNOSIS — S299XXA Unspecified injury of thorax, initial encounter: Secondary | ICD-10-CM | POA: Diagnosis not present

## 2021-04-28 ENCOUNTER — Encounter: Payer: Self-pay | Admitting: Endocrinology

## 2021-05-01 ENCOUNTER — Other Ambulatory Visit: Payer: PPO

## 2021-05-01 ENCOUNTER — Other Ambulatory Visit: Payer: Self-pay

## 2021-05-01 DIAGNOSIS — R778 Other specified abnormalities of plasma proteins: Secondary | ICD-10-CM | POA: Diagnosis not present

## 2021-05-05 ENCOUNTER — Other Ambulatory Visit: Payer: Self-pay | Admitting: Endocrinology

## 2021-05-05 ENCOUNTER — Other Ambulatory Visit: Payer: PPO

## 2021-05-05 ENCOUNTER — Other Ambulatory Visit: Payer: Self-pay

## 2021-05-05 DIAGNOSIS — R779 Abnormality of plasma protein, unspecified: Secondary | ICD-10-CM

## 2021-05-05 LAB — IMMUNOFIXATION ELECTROPHORESIS
IgG (Immunoglobin G), Serum: 1489 mg/dL (ref 600–1540)
IgM, Serum: 94 mg/dL (ref 50–300)
Immunoglobulin A: 441 mg/dL — ABNORMAL HIGH (ref 70–320)

## 2021-05-06 ENCOUNTER — Encounter: Payer: Self-pay | Admitting: Endocrinology

## 2021-05-06 DIAGNOSIS — D490 Neoplasm of unspecified behavior of digestive system: Secondary | ICD-10-CM | POA: Diagnosis not present

## 2021-05-06 DIAGNOSIS — M4624 Osteomyelitis of vertebra, thoracic region: Secondary | ICD-10-CM | POA: Diagnosis not present

## 2021-05-06 DIAGNOSIS — M4644 Discitis, unspecified, thoracic region: Secondary | ICD-10-CM | POA: Diagnosis not present

## 2021-05-06 DIAGNOSIS — M868X8 Other osteomyelitis, other site: Secondary | ICD-10-CM | POA: Diagnosis not present

## 2021-05-06 LAB — EXTRA URINE SPECIMEN

## 2021-05-06 LAB — CALCIUM, URINE, 24 HOUR: Calcium, 24H Urine: 124 mg/24 h

## 2021-05-07 ENCOUNTER — Telehealth: Payer: Self-pay | Admitting: Physician Assistant

## 2021-05-07 NOTE — Telephone Encounter (Signed)
Scheduled appt per 10/24 referral. Pt is aware of appt date and time.

## 2021-05-09 DIAGNOSIS — Z432 Encounter for attention to ileostomy: Secondary | ICD-10-CM | POA: Diagnosis not present

## 2021-05-16 DIAGNOSIS — C181 Malignant neoplasm of appendix: Secondary | ICD-10-CM | POA: Diagnosis not present

## 2021-05-16 DIAGNOSIS — M4624 Osteomyelitis of vertebra, thoracic region: Secondary | ICD-10-CM | POA: Diagnosis not present

## 2021-05-21 ENCOUNTER — Inpatient Hospital Stay: Payer: PPO

## 2021-05-21 ENCOUNTER — Encounter: Payer: Self-pay | Admitting: Physician Assistant

## 2021-05-21 ENCOUNTER — Other Ambulatory Visit: Payer: Self-pay

## 2021-05-21 ENCOUNTER — Inpatient Hospital Stay: Payer: PPO | Attending: Physician Assistant | Admitting: Physician Assistant

## 2021-05-21 VITALS — BP 136/75 | HR 84 | Temp 97.8°F | Resp 17 | Ht 65.0 in | Wt 169.4 lb

## 2021-05-21 DIAGNOSIS — E785 Hyperlipidemia, unspecified: Secondary | ICD-10-CM | POA: Diagnosis not present

## 2021-05-21 DIAGNOSIS — Z9049 Acquired absence of other specified parts of digestive tract: Secondary | ICD-10-CM | POA: Diagnosis not present

## 2021-05-21 DIAGNOSIS — R768 Other specified abnormal immunological findings in serum: Secondary | ICD-10-CM | POA: Diagnosis not present

## 2021-05-21 DIAGNOSIS — I10 Essential (primary) hypertension: Secondary | ICD-10-CM | POA: Insufficient documentation

## 2021-05-21 DIAGNOSIS — Z932 Ileostomy status: Secondary | ICD-10-CM | POA: Insufficient documentation

## 2021-05-21 DIAGNOSIS — M3500 Sicca syndrome, unspecified: Secondary | ICD-10-CM | POA: Diagnosis not present

## 2021-05-21 DIAGNOSIS — Z85038 Personal history of other malignant neoplasm of large intestine: Secondary | ICD-10-CM | POA: Insufficient documentation

## 2021-05-21 DIAGNOSIS — E669 Obesity, unspecified: Secondary | ICD-10-CM | POA: Insufficient documentation

## 2021-05-21 DIAGNOSIS — Z87891 Personal history of nicotine dependence: Secondary | ICD-10-CM | POA: Diagnosis not present

## 2021-05-21 LAB — CMP (CANCER CENTER ONLY)
ALT: 17 U/L (ref 0–44)
AST: 24 U/L (ref 15–41)
Albumin: 3.9 g/dL (ref 3.5–5.0)
Alkaline Phosphatase: 124 U/L (ref 38–126)
Anion gap: 7 (ref 5–15)
BUN: 19 mg/dL (ref 8–23)
CO2: 26 mmol/L (ref 22–32)
Calcium: 10.2 mg/dL (ref 8.9–10.3)
Chloride: 106 mmol/L (ref 98–111)
Creatinine: 0.88 mg/dL (ref 0.44–1.00)
GFR, Estimated: >60 mL/min (ref >60)
Glucose, Bld: 89 mg/dL (ref 70–99)
Potassium: 4.1 mmol/L (ref 3.5–5.1)
Sodium: 139 mmol/L (ref 135–145)
Total Bilirubin: 0.4 mg/dL (ref 0.3–1.2)
Total Protein: 7.9 g/dL (ref 6.5–8.1)

## 2021-05-21 LAB — CBC WITH DIFFERENTIAL (CANCER CENTER ONLY)
Abs Immature Granulocytes: 0.01 10*3/uL (ref 0.00–0.07)
Basophils Absolute: 0 10*3/uL (ref 0.0–0.1)
Basophils Relative: 0 %
Eosinophils Absolute: 0.1 10*3/uL (ref 0.0–0.5)
Eosinophils Relative: 1 %
HCT: 33.5 % — ABNORMAL LOW (ref 36.0–46.0)
Hemoglobin: 11.4 g/dL — ABNORMAL LOW (ref 12.0–15.0)
Immature Granulocytes: 0 %
Lymphocytes Relative: 21 %
Lymphs Abs: 1.1 10*3/uL (ref 0.7–4.0)
MCH: 31.9 pg (ref 26.0–34.0)
MCHC: 34 g/dL (ref 30.0–36.0)
MCV: 93.8 fL (ref 80.0–100.0)
Monocytes Absolute: 0.4 10*3/uL (ref 0.1–1.0)
Monocytes Relative: 7 %
Neutro Abs: 3.7 10*3/uL (ref 1.7–7.7)
Neutrophils Relative %: 71 %
Platelet Count: 329 10*3/uL (ref 150–400)
RBC: 3.57 MIL/uL — ABNORMAL LOW (ref 3.87–5.11)
RDW: 15.9 % — ABNORMAL HIGH (ref 11.5–15.5)
WBC Count: 5.2 10*3/uL (ref 4.0–10.5)
nRBC: 0 % (ref 0.0–0.2)

## 2021-05-21 LAB — LACTATE DEHYDROGENASE: LDH: 183 U/L (ref 98–192)

## 2021-05-21 NOTE — Progress Notes (Signed)
[place] [phone]   Fax:([phone] [place] NOTE  Patient Care Team: Leeroy Cha, MD as PCP - General (Internal Medicine) Michael Boston, MD as Consulting Physician (General Surgery) Ena Dawley, MD as Consulting Physician (Obstetrics and Gynecology)  Hematological/Oncological History 1) 04/15/2021: SPEP revaled elevated beta-2 globulin at 0.6. No evidence of monoclonal protein was detected  2) 05/01/2021: Immunofixation revealed elevated IgA 441. A poorly-defined area of restricted protein mobility is detected  and is reactive with lambda light chain antisera.   3) 05/21/2021: Establish care with Dede Query PA-C  CHIEF COMPLAINTS/PURPOSE OF CONSULTATION:  "Elevated immunoglobulin level.  "  HISTORY OF PRESENTING ILLNESS:  Kristy Sherman 70 y.o. 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   On exam today, Kristy Sherman reports that her energy levels are fairly stable.  She is able to complete all her ADLs independently.  She has a good appetite and denies any recent weight loss.  She denies any nausea, vomiting or abdominal pain.  Patient has an ileostomy that was placed in March 2022 after presenting with an obstruction.  She reports good output from her ostomy.  He denies easy bruising or signs of bleeding.  Patient reports left shoulder pain that has been chronic.  She denies any fevers, chills, night sweats, shortness of breath, chest pain, cough, or cough.  She has no other complaints.  Rest of the 10 point ROS is below.  MEDICAL HISTORY:  Past Medical History:  Diagnosis Date   ANKLE PAIN, LEFT 10/17/2007   Qualifier: Diagnosis of  By: Jerold Coombe     Hyperlipidemia    Hypertension    Obesity    Sjoegren syndrome     SURGICAL HISTORY: Past Surgical History:  Procedure  Laterality Date   ABDOMINAL HYSTERECTOMY     with [place], [place] N/A 11/11/2018   Procedure: APPENDECTOMY LAPAROSCOPIC;  Surgeon: Michael Boston, MD;  Location: [facility];  Service: General;  Laterality: N/A;   SPLENECTOMY, TOTAL      SOCIAL HISTORY: Social History   Socioeconomic History   Marital status: Married    Spouse name: Not on file   Number of children: Not on file   Years of education: Not on file   Highest education level: Not on file  Occupational History   Not on file  Tobacco Use   Smoking status: Former    Years: 5.00    Types: Cigarettes   Smokeless tobacco: Never   Tobacco comments:    Quit smoking 1979.   Substance and Sexual Activity   Alcohol use: Not Currently    Comment: occasional   Drug use: No   Sexual activity: Yes    Birth control/protection: Surgical, Post-menopausal    Comment: BTL  Other Topics Concern   Not on file  Social History Narrative   Nurse retired 2019 from [employer]   Social Determinants of Health   Financial Resource Strain: Not on file  Food Insecurity: Not on file  Transportation Needs: Not on file  Physical Activity: Not on file  Stress: Not on file  Social Connections: Not on file  Intimate Partner Violence: Not on file    FAMILY HISTORY: Family History  Problem Relation Age of Onset   Colon cancer Maternal Grandmother 55  Asthma Other    Hypertension Other    Hypercalcemia Neg Hx     ALLERGIES:  has No Known Allergies.  MEDICATIONS:  Current Outpatient Medications  Medication Sig Dispense Refill   amLODipine (NORVASC) 10 MG tablet Take 10 mg by mouth daily.     Cholecalciferol (VITAMIN D3 PO) Take 1,000 Units by mouth daily.     Pediatric Multivitamins-Iron (FLINTSTONES COMPLETE PO) Take by mouth daily. 2 chewable tablets PO daily     traZODone (DESYREL) 50 MG tablet Take 50 mg by mouth at bedtime as needed for  sleep.     Aspirin (BAYER LOW DOSE PO) Take by mouth.     atorvastatin (LIPITOR) 10 MG tablet Take 10 mg by mouth daily.     hydrochlorothiazide (HYDRODIURIL) 25 MG tablet Take 25 mg by mouth daily.     hyoscyamine (LEVSIN SL) 0.125 MG SL tablet Place 1 tablet (0.125 mg total) under the tongue every 4 (four) hours as needed for cramping (abdominal pain). 30 tablet 0   lidocaine (LIDODERM) 5 % Place 1 patch onto the skin daily. Remove & Discard patch within 12 hours or as directed by MD 15 patch 0   ondansetron (ZOFRAN ODT) 4 MG disintegrating tablet Take 1 tablet (4 mg total) by mouth every 8 (eight) hours as needed. 20 tablet 0   oxyCODONE-acetaminophen (PERCOCET/ROXICET) 5-325 MG tablet Take 1 tablet by mouth every 6 (six) hours as needed for severe pain. 10 tablet 0   promethazine (PHENERGAN) 25 MG suppository Place 1 suppository (25 mg total) rectally every 6 (six) hours as needed for nausea or vomiting. 12 each 0   sucralfate (CARAFATE) 1 GM/10ML suspension Take 10 mLs (1 g total) by mouth 4 (four) times daily -  with meals and at bedtime. 420 mL 0   traMADol (ULTRAM) 50 MG tablet Take 1-2 tablets (50-100 mg total) by mouth every 6 (six) hours as needed for moderate pain or severe pain. 20 tablet 0   No current facility-administered medications for this visit.    REVIEW OF SYSTEMS:   Constitutional: ( - ) fevers, ( - )  chills , ( - ) night sweats Eyes: ( - ) blurriness of vision, ( - ) double vision, ( - ) watery eyes Ears, nose, mouth, throat, and face: ( - ) mucositis, ( - ) sore throat Respiratory: ( - ) cough, ( - ) dyspnea, ( - ) wheezes Cardiovascular: ( - ) palpitation, ( - ) chest discomfort, ( - ) lower extremity swelling Gastrointestinal:  ( - ) nausea, ( - ) heartburn, ( - ) change in bowel habits Skin: ( - ) abnormal skin rashes Lymphatics: ( - ) new lymphadenopathy, ( - ) easy bruising Neurological: ( - ) numbness, ( - ) tingling, ( - ) new weaknesses Behavioral/Psych: ( -  ) mood change, ( - ) new changes  All other systems were reviewed with the patient and are negative.  PHYSICAL EXAMINATION: ECOG PERFORMANCE STATUS: 0 - Asymptomatic  Vitals:   05/21/21 1050  BP: 136/75  Pulse: 84  Resp: 17  Temp: 97.8 F (36.6 C)  SpO2: 100%   Filed Weights   05/21/21 1050  Weight: 169 lb 6.4 oz (76.8 kg)    GENERAL: well appearing female in NAD  SKIN: skin color, texture, turgor are normal, no rashes or significant lesions EYES: conjunctiva are pink and non-injected, sclera clear OROPHARYNX: no exudate, no erythema; lips, buccal mucosa, and tongue normal  NECK: supple, non-tender  LYMPH:  no palpable lymphadenopathy in the cervical, axillary or supraclavicular lymph nodes.  LUNGS: clear to auscultation and percussion with normal breathing effort HEART: regular rate & rhythm and no murmurs and no lower extremity edema ABDOMEN: soft, non-tender, non-distended, normal bowel sounds. Ostomy in place without any evidence or erythema or discharge.  Musculoskeletal: no cyanosis of digits and no clubbing  PSYCH: alert & oriented x 3, fluent speech NEURO: no focal motor/sensory deficits  LABORATORY DATA:  I have reviewed the data as listed CBC Latest Ref Rng & Units 05/21/2021 09/13/2020 09/11/2020  WBC 4.0 - 10.5 K/uL 5.2 7.6 7.6  Hemoglobin 12.0 - 15.0 g/dL 11.4(L) 12.7 13.3  Hematocrit 36.0 - 46.0 % 33.5(L) 38.5 39.7  Platelets 150 - 400 K/uL 329 320 327    CMP Latest Ref Rng & Units 05/21/2021 04/15/2021 09/13/2020  Glucose 70 - 99 mg/dL 89 - 133(H)  BUN 8 - 23 mg/dL 19 - 23  Creatinine 0.44 - 1.00 mg/dL 0.88 - 1.07(H)  Sodium 135 - 145 mmol/L 139 - 139  Potassium 3.5 - 5.1 mmol/L 4.1 - 3.8  Chloride 98 - 111 mmol/L 106 - 102  CO2 22 - 32 mmol/L 26 - 25  Calcium 8.9 - 10.3 mg/dL 10.2 10.5(H) 10.1  Total Protein 6.5 - 8.1 g/dL 7.9 7.8 7.8  Total Bilirubin 0.3 - 1.2 mg/dL 0.4 - 0.6  Alkaline Phos 38 - 126 U/L 124 - 98  AST 15 - 41 U/L 24 - 35  ALT 0 - 44 U/L 17  - 40   ASSESSMENT & PLAN Kristy Sherman is a 70 y.o. female who presents to the clinic for evaluation for elevated IgA levels in the setting of borderline hypercalcemia. I reviewed differentials for elevated IgA including inflammatory process, infectious process and bone marrow disorders. Prior serum protein electrophoresis did not indicate monoclonal protein. Suspect likely underlying cause of elevated antibody levels is from recent findings from MRI that suggest active discitis versus osteomyelitis at T7 and T8.  Patient underwent percutaneous intervertebral disc base biopsy at T7-8 on 05/16/2021, pathology results pending.  Recommend to proceed with serologic evaluation and check CBC, CMP, LDH, SPEP with IFE and serum free light chains.  If there is no evidence of monoclonal protein, no further work-up is recommended.  #Elevated IgA levels: --Likely secondary to inflammatory versus infectious process based on MRI thoracic spine from 04/25/2021 that showed active discitis versus osteomyelitis at T7 and T8 --Labs today to check CBC, CMP, LDH, SPEP with IFE and serum free light chains.   --If there is evidence of monoclonal protein, we will proceed with full MGUS evaluation including UPEP and bone met survey. Otherwise no additional workup is needed.  --RTC based on above workup.   #Low grade appendiceal neoplasm: --Under the care of Dr. Clovis Riley at [facility] --Diagnosed after underwent laparoscopic appendectomy on 11/11/2018. Pathology came back as low grade appendiceal neoplasm. --Underwent extensive cytoreductive surgery and HIPEC on 02/19/2020. 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  --Currently under surveillance.   Orders Placed This Encounter  Procedures   CBC with Differential ([place] Only)    Standing Status:   Future    Number of Occurrences:   1     Standing Expiration Date:   05/21/2022   CMP ([place] only)    Standing Status:   Future    Number of Occurrences:  1    Standing Expiration Date:   05/21/2022   Lactate dehydrogenase (LDH)    Standing Status:   Future    Number of Occurrences:   1    Standing Expiration Date:   05/21/2022   Multiple Myeloma Panel (SPEP&IFE w/QIG)    Standing Status:   Future    Number of Occurrences:   1    Standing Expiration Date:   05/21/2022   Kappa/lambda light chains    Standing Status:   Future    Number of Occurrences:   1    Standing Expiration Date:   05/21/2022    All questions were answered. The patient knows to call the clinic with any problems, questions or concerns.  I have spent a total of 60 minutes minutes of face-to-face and non-face-to-face time, preparing to see the patient, obtaining and/or reviewing separately obtained history, performing a medically appropriate examination, counseling and educating the patient, ordering tests, documenting clinical information in the electronic health record, and care coordination.   Dede Query, PA-C Department of Hematology/Oncology [place] at [facility] Phone: [phone]  Patient was seen with Dr. Lorenso Courier.   I have read the above note and personally examined the patient. I agree with the assessment and plan as noted above.  Briefly Kristy Sherman is a 70 year old female who presents for evaluation of increased IgA.  During her prior SPEP there was no evidence of a monoclonal protein.  We will do a full monoclonal gammopathy work-up in order to assure that there is not an underlying condition.  We will pursue SPEP, serum free light chains, as well as CBC, CMP, and LDH.  In the event that there is no evidence of monoclonal protein there is no need for routine follow-up in our clinic.   Ledell Peoples, MD Department of Hematology/Oncology [place] at [facility] Phone:  [phone] Pager: [phone] Email: [email or web address]

## 2021-05-22 LAB — KAPPA/LAMBDA LIGHT CHAINS
Kappa free light chain: 41 mg/L — ABNORMAL HIGH (ref 3.3–19.4)
Kappa, lambda light chain ratio: 1.75 — ABNORMAL HIGH (ref 0.26–1.65)
Lambda free light chains: 23.4 mg/L (ref 5.7–26.3)

## 2021-05-23 DIAGNOSIS — M4644 Discitis, unspecified, thoracic region: Secondary | ICD-10-CM | POA: Diagnosis not present

## 2021-05-26 LAB — MULTIPLE MYELOMA PANEL, SERUM
Albumin SerPl Elph-Mcnc: 3.8 g/dL (ref 2.9–4.4)
Albumin/Glob SerPl: 1.1 (ref 0.7–1.7)
Alpha 1: 0.2 g/dL (ref 0.0–0.4)
Alpha2 Glob SerPl Elph-Mcnc: 0.7 g/dL (ref 0.4–1.0)
B-Globulin SerPl Elph-Mcnc: 1.2 g/dL (ref 0.7–1.3)
Gamma Glob SerPl Elph-Mcnc: 1.5 g/dL (ref 0.4–1.8)
Globulin, Total: 3.6 g/dL (ref 2.2–3.9)
IgA: 428 mg/dL — ABNORMAL HIGH (ref 87–352)
IgG (Immunoglobin G), Serum: 1483 mg/dL (ref 586–1602)
IgM (Immunoglobulin M), Srm: 92 mg/dL (ref 26–217)
Total Protein ELP: 7.4 g/dL (ref 6.0–8.5)

## 2021-05-29 ENCOUNTER — Telehealth: Payer: Self-pay | Admitting: Physician Assistant

## 2021-05-29 NOTE — Telephone Encounter (Signed)
I spoke to Ms. Kristy Sherman to review the lab results from 05/21/2021. There is no evidence of monoclonal protein. There is mild increase in serum kappa free light chain and kappa/lambda ratio. The recommendation is not to pursue additional workup at this time. Patient can follow up with the clinic as needed.

## 2021-05-30 DIAGNOSIS — R32 Unspecified urinary incontinence: Secondary | ICD-10-CM | POA: Diagnosis not present

## 2021-05-30 DIAGNOSIS — R350 Frequency of micturition: Secondary | ICD-10-CM | POA: Diagnosis not present

## 2021-05-30 DIAGNOSIS — N898 Other specified noninflammatory disorders of vagina: Secondary | ICD-10-CM | POA: Diagnosis not present

## 2021-05-30 DIAGNOSIS — Z1231 Encounter for screening mammogram for malignant neoplasm of breast: Secondary | ICD-10-CM | POA: Diagnosis not present

## 2021-05-30 DIAGNOSIS — Z01411 Encounter for gynecological examination (general) (routine) with abnormal findings: Secondary | ICD-10-CM | POA: Diagnosis not present

## 2021-05-30 DIAGNOSIS — Z6829 Body mass index (BMI) 29.0-29.9, adult: Secondary | ICD-10-CM | POA: Diagnosis not present

## 2021-05-30 DIAGNOSIS — Z049 Encounter for examination and observation for unspecified reason: Secondary | ICD-10-CM | POA: Diagnosis not present

## 2021-06-10 ENCOUNTER — Other Ambulatory Visit: Payer: Self-pay | Admitting: Obstetrics & Gynecology

## 2021-06-10 DIAGNOSIS — Z049 Encounter for examination and observation for unspecified reason: Secondary | ICD-10-CM

## 2021-06-11 DIAGNOSIS — Z432 Encounter for attention to ileostomy: Secondary | ICD-10-CM | POA: Diagnosis not present

## 2021-06-17 DIAGNOSIS — M4644 Discitis, unspecified, thoracic region: Secondary | ICD-10-CM | POA: Diagnosis not present

## 2021-06-17 DIAGNOSIS — M4624 Osteomyelitis of vertebra, thoracic region: Secondary | ICD-10-CM | POA: Diagnosis not present

## 2021-06-29 ENCOUNTER — Ambulatory Visit
Admission: RE | Admit: 2021-06-29 | Discharge: 2021-06-29 | Disposition: A | Discharge status: Home / Self Care | Payer: PPO | Source: Ambulatory Visit | Attending: Obstetrics & Gynecology | Admitting: Obstetrics & Gynecology

## 2021-06-29 ENCOUNTER — Other Ambulatory Visit: Payer: Self-pay

## 2021-06-29 DIAGNOSIS — Z9049 Acquired absence of other specified parts of digestive tract: Secondary | ICD-10-CM | POA: Diagnosis not present

## 2021-06-29 DIAGNOSIS — Z049 Encounter for examination and observation for unspecified reason: Secondary | ICD-10-CM

## 2021-06-29 DIAGNOSIS — Z85038 Personal history of other malignant neoplasm of large intestine: Secondary | ICD-10-CM | POA: Diagnosis not present

## 2021-06-29 DIAGNOSIS — K769 Liver disease, unspecified: Secondary | ICD-10-CM | POA: Diagnosis not present

## 2021-06-29 DIAGNOSIS — Z90710 Acquired absence of both cervix and uterus: Secondary | ICD-10-CM | POA: Diagnosis not present

## 2021-06-29 MED ORDER — GADOBENATE DIMEGLUMINE 529 MG/ML IV SOLN
15.0000 mL | Freq: Once | INTRAVENOUS | Status: AC | PRN
  Administered 2021-06-29: 14:00:00 15 mL via INTRAVENOUS

## 2021-07-16 DIAGNOSIS — Z432 Encounter for attention to ileostomy: Secondary | ICD-10-CM | POA: Diagnosis not present

## 2021-07-17 ENCOUNTER — Other Ambulatory Visit: Payer: Self-pay

## 2021-07-17 ENCOUNTER — Ambulatory Visit (INDEPENDENT_AMBULATORY_CARE_PROVIDER_SITE_OTHER): Payer: PPO | Admitting: Endocrinology

## 2021-07-17 ENCOUNTER — Encounter: Payer: Self-pay | Admitting: Endocrinology

## 2021-07-17 NOTE — Patient Instructions (Addendum)
Please continue the same Vitamin-D pills and amlodipine.   I would be happy to see you back here as needed (especially if the calcium is high).

## 2021-07-17 NOTE — Progress Notes (Signed)
Subjective:    Patient ID: Kristy Sherman, female    DOB: Nov 29, 1950, 71 y.o.   MRN: [id number]  HPI Pt returns for f/u of hypercalcemia (dx'ed mid-2022 (it was low in early 2022); she has never had osteoporosis, urolithiasis, or bony fracture; She takes Vit-D, 2000 units/d; she has lost 50 lbs x 1 year; w/u was neg, except 24HR urine Ca++ of 124 mg).  pt states she feels well in general. Past Medical History:  Diagnosis Date   ANKLE PAIN, LEFT 10/17/2007   Qualifier: Diagnosis of  By: Jerold Coombe     Hyperlipidemia    Hypertension    Obesity    Sjoegren syndrome     Past Surgical History:  Procedure Laterality Date   ABDOMINAL HYSTERECTOMY     with [place], [place] N/A 11/11/2018   Procedure: APPENDECTOMY LAPAROSCOPIC;  Surgeon: Michael Boston, MD;  Location: [facility];  Service: General;  Laterality: N/A;   SPLENECTOMY, TOTAL      Social History   Socioeconomic History   Marital status: Married    Spouse name: Not on file   Number of children: Not on file   Years of education: Not on file   Highest education level: Not on file  Occupational History   Not on file  Tobacco Use   Smoking status: Former    Years: 5.00    Types: Cigarettes   Smokeless tobacco: Never   Tobacco comments:    Quit smoking 1979.   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     Cholecalciferol (VITAMIN D3 PO) Take 1,000 Units by mouth daily.     Pediatric Multivitamins-Iron (FLINTSTONES COMPLETE PO) Take by mouth daily. 2 chewable tablets PO daily     traZODone (DESYREL) 50 MG tablet Take 50 mg by mouth at bedtime as needed for sleep.     Aspirin (BAYER LOW DOSE PO) Take by mouth.     atorvastatin (LIPITOR) 10 MG tablet Take 10 mg by mouth daily.     hyoscyamine (LEVSIN SL) 0.125 MG SL tablet Place 1 tablet (0.125 mg total) under the tongue every 4 (four) hours as needed for cramping (abdominal pain). 30 tablet 0   lidocaine (LIDODERM) 5 % Place 1 patch onto the skin daily. Remove & Discard patch within 12 hours or as directed by MD 15 patch 0   ondansetron (ZOFRAN ODT) 4 MG disintegrating tablet Take 1 tablet (4 mg total) by mouth every 8 (eight) hours as needed. 20 tablet 0   oxyCODONE-acetaminophen (PERCOCET/ROXICET) 5-325 MG tablet Take 1 tablet by mouth every 6 (six) hours as needed  for severe pain. 10 tablet 0   promethazine (PHENERGAN) 25 MG suppository Place 1 suppository (25 mg total) rectally every 6 (six) hours as needed for nausea or vomiting. 12 each 0   sucralfate (CARAFATE) 1 GM/10ML suspension Take 10 mLs (1 g total) by mouth 4 (four) times daily -  with meals and at bedtime. 420 mL 0   traMADol (ULTRAM) 50 MG tablet Take 1-2 tablets (50-100 mg total) by mouth every 6 (six) hours as needed for moderate pain or severe pain. 20 tablet 0   No current facility-administered medications on file prior to visit.    No Known Allergies  Family History  Problem Relation Age of Onset   Colon cancer Maternal Grandmother 71   Asthma Other    Hypertension Other    Hypercalcemia Neg Hx     BP 104/62 (BP Location: Right Arm, Patient Position: Sitting, Cuff Size: Normal)    Pulse 80    Ht 5\' 5"  (1.651 m)    Wt 173 lb  (78.5 kg)    SpO2 99%    BMI 28.79 kg/m    Review of Systems She has regained a few lbs.      Objective:   Physical Exam VITAL SIGNS:  See vs page.   GENERAL: no distress.   EXT: trace bilat leg edema.     Lab Results  Component Value Date   PTH 29 04/15/2021   CALCIUM 10.2 05/21/2021   Lab Results  Component Value Date   ALT 17 05/21/2021   AST 24 05/21/2021   ALKPHOS 124 05/21/2021   BILITOT 0.4 05/21/2021      Assessment & Plan:  Hypercalcemia: stable.   Patient Instructions  Please continue the same Vitamin-D pills and amlodipine.   I would be happy to see you back here as needed (especially if the calcium is high).

## 2021-07-22 ENCOUNTER — Other Ambulatory Visit: Payer: Self-pay

## 2021-07-22 ENCOUNTER — Ambulatory Visit (HOSPITAL_COMMUNITY)
Admission: RE | Admit: 2021-07-22 | Discharge: 2021-07-22 | Disposition: A | Discharge status: Home / Self Care | Payer: PPO | Source: Ambulatory Visit | Attending: Nurse Practitioner | Admitting: Nurse Practitioner

## 2021-07-22 DIAGNOSIS — L24B1 Irritant contact dermatitis related to digestive stoma or fistula: Secondary | ICD-10-CM | POA: Diagnosis not present

## 2021-07-22 DIAGNOSIS — Y838 Other surgical procedures as the cause of abnormal reaction of the patient, or of later complication, without mention of misadventure at the time of the procedure: Secondary | ICD-10-CM | POA: Diagnosis not present

## 2021-07-22 DIAGNOSIS — Z432 Encounter for attention to ileostomy: Secondary | ICD-10-CM | POA: Diagnosis not present

## 2021-07-22 DIAGNOSIS — T8189XA Other complications of procedures, not elsewhere classified, initial encounter: Secondary | ICD-10-CM | POA: Insufficient documentation

## 2021-07-22 MED ORDER — SILVER NITRATE-POT NITRATE 75-25 % EX MISC
CUTANEOUS | 0 refills | Status: AC
Start: 2021-07-23 — End: 2021-08-22

## 2021-07-22 NOTE — Progress Notes (Signed)
[facility]   Reason for visit:  Loop ileostomy with peristomal breakdown.  HPI:  Primary appendiceal adenocarcinoma with peritonitis and perforation.  S/P creation of loop ileostomy ROS  Review of Systems  Gastrointestinal:        Midline abdominal wound has healed, scar present.  Creasing to peristomal skin at 3 and 9 o'clock.  Hypergranulation at 6 o'clock from exposure to effluent/moisture   Skin:  Positive for wound.       Silver nitrate to peristomal hypergranulation  All other systems reviewed and are negative. Vital signs:  BP 133/83 (BP Location: Right Arm)    Pulse 83    Temp 98 F (36.7 C) (Oral)    Resp 18  Exam:  Physical Exam Constitutional:      Appearance: Normal appearance. She is normal weight.  Abdominal:     Palpations: Abdomen is soft.     Comments: Scarring to midline abdomen Peristomal creasing.  Soft abdomen with skin folds, requires convex pouch.  Skin:    General: Skin is warm and dry.  Neurological:     General: No focal deficit present.     Mental Status: She is alert.  Psychiatric:        Mood and Affect: Mood normal.        Behavior: Behavior normal.        Judgment: Judgment normal.    Stoma type/location:  RLQ ileostomy Stomal assessment/size:  1 1/4" pink , os points down directly to this area.  Patient is applying barrier ring to presized pouch, which is larger than her stoma and exposing peristomal skin to effluent and moisture.   Peristomal assessment:  hypergranulation tissue at 6:00.  Raised  Treatment options for stomal/peristomal skin: Barrier ring to be placed directly on skin, against the stoma to protect skin.  Output: soft brown stool Ostomy pouching: 2pc. Sytem with presized barrier and barrier ring  Education provided:  To treat hypergranulation tissue, I applied silver nitrate x 2 applications.  Tissue became dark and flat.  Stopped cauterizaton with NS syringe.  Pouched as normal with barrier ring fitting snugly  against stoma.  Rx called in to [facility].  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     Impression/dx  Hypergranulation tissue to peristomal skin Discussion  Rationale for silver nitrate Pouching by applying barrier ring directly to skin to fit against stoma.  No exposed skin. Plan  Call in silver nitrate.   See back as needed.     Visit time: 60 minutes.   Domenic Moras FNP-BC

## 2021-07-22 NOTE — Discharge Instructions (Signed)
Silver nitrate called in to [facility] When applying, tissue will turn black. Irrigate affected tissue with NS after application to stop cauterization NS syringes provided.  Barrier ring applied directly to skin, wrapping around stoma with no exposed skin.

## 2021-07-30 DIAGNOSIS — M4644 Discitis, unspecified, thoracic region: Secondary | ICD-10-CM | POA: Diagnosis not present

## 2021-08-01 DIAGNOSIS — R350 Frequency of micturition: Secondary | ICD-10-CM | POA: Diagnosis not present

## 2021-08-01 DIAGNOSIS — I10 Essential (primary) hypertension: Secondary | ICD-10-CM | POA: Diagnosis not present

## 2021-08-01 DIAGNOSIS — Z87440 Personal history of urinary (tract) infections: Secondary | ICD-10-CM | POA: Diagnosis not present

## 2021-08-01 DIAGNOSIS — D373 Neoplasm of uncertain behavior of appendix: Secondary | ICD-10-CM | POA: Diagnosis not present

## 2021-08-05 DIAGNOSIS — Z8249 Family history of ischemic heart disease and other diseases of the circulatory system: Secondary | ICD-10-CM | POA: Diagnosis not present

## 2021-08-05 DIAGNOSIS — Z79899 Other long term (current) drug therapy: Secondary | ICD-10-CM | POA: Diagnosis not present

## 2021-08-05 DIAGNOSIS — Z8615 Personal history of latent tuberculosis infection: Secondary | ICD-10-CM | POA: Diagnosis not present

## 2021-08-05 DIAGNOSIS — M4644 Discitis, unspecified, thoracic region: Secondary | ICD-10-CM | POA: Diagnosis not present

## 2021-08-05 DIAGNOSIS — Z8349 Family history of other endocrine, nutritional and metabolic diseases: Secondary | ICD-10-CM | POA: Diagnosis not present

## 2021-08-05 DIAGNOSIS — Z833 Family history of diabetes mellitus: Secondary | ICD-10-CM | POA: Diagnosis not present

## 2021-08-06 DIAGNOSIS — M4644 Discitis, unspecified, thoracic region: Secondary | ICD-10-CM | POA: Diagnosis not present

## 2021-08-20 DIAGNOSIS — Z432 Encounter for attention to ileostomy: Secondary | ICD-10-CM | POA: Diagnosis not present

## 2021-08-26 DIAGNOSIS — M4644 Discitis, unspecified, thoracic region: Secondary | ICD-10-CM | POA: Diagnosis not present

## 2021-08-26 DIAGNOSIS — D849 Immunodeficiency, unspecified: Secondary | ICD-10-CM | POA: Diagnosis not present

## 2021-09-10 DIAGNOSIS — M4644 Discitis, unspecified, thoracic region: Secondary | ICD-10-CM | POA: Diagnosis not present

## 2021-09-10 DIAGNOSIS — Z452 Encounter for adjustment and management of vascular access device: Secondary | ICD-10-CM | POA: Diagnosis not present

## 2021-09-11 DIAGNOSIS — M4644 Discitis, unspecified, thoracic region: Secondary | ICD-10-CM | POA: Diagnosis not present

## 2021-09-16 DIAGNOSIS — M4644 Discitis, unspecified, thoracic region: Secondary | ICD-10-CM | POA: Diagnosis not present

## 2021-09-19 DIAGNOSIS — M4644 Discitis, unspecified, thoracic region: Secondary | ICD-10-CM | POA: Diagnosis not present

## 2021-09-22 DIAGNOSIS — Z432 Encounter for attention to ileostomy: Secondary | ICD-10-CM | POA: Diagnosis not present

## 2021-09-22 DIAGNOSIS — M4644 Discitis, unspecified, thoracic region: Secondary | ICD-10-CM | POA: Diagnosis not present

## 2021-09-26 DIAGNOSIS — M4644 Discitis, unspecified, thoracic region: Secondary | ICD-10-CM | POA: Diagnosis not present

## 2021-09-29 DIAGNOSIS — M4644 Discitis, unspecified, thoracic region: Secondary | ICD-10-CM | POA: Diagnosis not present

## 2021-10-03 DIAGNOSIS — M4644 Discitis, unspecified, thoracic region: Secondary | ICD-10-CM | POA: Diagnosis not present

## 2021-10-06 DIAGNOSIS — M4644 Discitis, unspecified, thoracic region: Secondary | ICD-10-CM | POA: Diagnosis not present

## 2021-10-08 DIAGNOSIS — D373 Neoplasm of uncertain behavior of appendix: Secondary | ICD-10-CM | POA: Diagnosis not present

## 2021-10-08 DIAGNOSIS — M4644 Discitis, unspecified, thoracic region: Secondary | ICD-10-CM | POA: Diagnosis not present

## 2021-10-08 DIAGNOSIS — D3A092 Benign carcinoid tumor of the stomach: Secondary | ICD-10-CM | POA: Diagnosis not present

## 2021-10-08 DIAGNOSIS — R590 Localized enlarged lymph nodes: Secondary | ICD-10-CM | POA: Diagnosis not present

## 2021-10-08 DIAGNOSIS — M4624 Osteomyelitis of vertebra, thoracic region: Secondary | ICD-10-CM | POA: Diagnosis not present

## 2021-10-08 DIAGNOSIS — C181 Malignant neoplasm of appendix: Secondary | ICD-10-CM | POA: Diagnosis not present

## 2021-10-10 DIAGNOSIS — M4644 Discitis, unspecified, thoracic region: Secondary | ICD-10-CM | POA: Diagnosis not present

## 2021-10-13 DIAGNOSIS — M4644 Discitis, unspecified, thoracic region: Secondary | ICD-10-CM | POA: Diagnosis not present

## 2021-10-17 DIAGNOSIS — M4644 Discitis, unspecified, thoracic region: Secondary | ICD-10-CM | POA: Diagnosis not present

## 2021-10-24 DIAGNOSIS — Z432 Encounter for attention to ileostomy: Secondary | ICD-10-CM | POA: Diagnosis not present

## 2021-11-24 DIAGNOSIS — Z79899 Other long term (current) drug therapy: Secondary | ICD-10-CM | POA: Diagnosis not present

## 2021-11-24 DIAGNOSIS — C181 Malignant neoplasm of appendix: Secondary | ICD-10-CM | POA: Diagnosis not present

## 2021-11-24 DIAGNOSIS — D373 Neoplasm of uncertain behavior of appendix: Secondary | ICD-10-CM | POA: Diagnosis not present

## 2021-11-24 DIAGNOSIS — Z9889 Other specified postprocedural states: Secondary | ICD-10-CM | POA: Diagnosis not present

## 2021-11-24 DIAGNOSIS — I10 Essential (primary) hypertension: Secondary | ICD-10-CM | POA: Diagnosis not present

## 2021-11-24 DIAGNOSIS — Z932 Ileostomy status: Secondary | ICD-10-CM | POA: Diagnosis not present

## 2021-11-24 DIAGNOSIS — Z87891 Personal history of nicotine dependence: Secondary | ICD-10-CM | POA: Diagnosis not present

## 2021-11-26 DIAGNOSIS — Z8509 Personal history of malignant neoplasm of other digestive organs: Secondary | ICD-10-CM | POA: Diagnosis not present

## 2021-11-26 DIAGNOSIS — Z1211 Encounter for screening for malignant neoplasm of colon: Secondary | ICD-10-CM | POA: Diagnosis not present

## 2021-11-26 DIAGNOSIS — C181 Malignant neoplasm of appendix: Secondary | ICD-10-CM | POA: Diagnosis not present

## 2021-11-27 DIAGNOSIS — N952 Postmenopausal atrophic vaginitis: Secondary | ICD-10-CM | POA: Diagnosis not present

## 2021-11-27 DIAGNOSIS — C181 Malignant neoplasm of appendix: Secondary | ICD-10-CM | POA: Diagnosis not present

## 2021-11-27 DIAGNOSIS — N398 Other specified disorders of urinary system: Secondary | ICD-10-CM | POA: Diagnosis not present

## 2021-11-27 DIAGNOSIS — N941 Unspecified dyspareunia: Secondary | ICD-10-CM | POA: Diagnosis not present

## 2021-11-27 DIAGNOSIS — Z932 Ileostomy status: Secondary | ICD-10-CM | POA: Diagnosis not present

## 2021-12-01 DIAGNOSIS — E785 Hyperlipidemia, unspecified: Secondary | ICD-10-CM | POA: Diagnosis not present

## 2021-12-01 DIAGNOSIS — M4644 Discitis, unspecified, thoracic region: Secondary | ICD-10-CM | POA: Diagnosis not present

## 2021-12-01 DIAGNOSIS — I10 Essential (primary) hypertension: Secondary | ICD-10-CM | POA: Diagnosis not present

## 2021-12-01 DIAGNOSIS — G8929 Other chronic pain: Secondary | ICD-10-CM | POA: Diagnosis not present

## 2021-12-01 DIAGNOSIS — Z9229 Personal history of other drug therapy: Secondary | ICD-10-CM | POA: Diagnosis not present

## 2021-12-19 DIAGNOSIS — Z432 Encounter for attention to ileostomy: Secondary | ICD-10-CM | POA: Diagnosis not present

## 2022-01-14 DIAGNOSIS — Z9889 Other specified postprocedural states: Secondary | ICD-10-CM | POA: Diagnosis not present

## 2022-01-14 DIAGNOSIS — C181 Malignant neoplasm of appendix: Secondary | ICD-10-CM | POA: Diagnosis not present

## 2022-01-14 DIAGNOSIS — D373 Neoplasm of uncertain behavior of appendix: Secondary | ICD-10-CM | POA: Diagnosis not present

## 2022-01-15 DIAGNOSIS — Z432 Encounter for attention to ileostomy: Secondary | ICD-10-CM | POA: Diagnosis not present

## 2022-01-28 DIAGNOSIS — D373 Neoplasm of uncertain behavior of appendix: Secondary | ICD-10-CM | POA: Diagnosis not present

## 2022-01-28 DIAGNOSIS — R7309 Other abnormal glucose: Secondary | ICD-10-CM | POA: Diagnosis not present

## 2022-01-29 DIAGNOSIS — I10 Essential (primary) hypertension: Secondary | ICD-10-CM | POA: Diagnosis not present

## 2022-01-29 DIAGNOSIS — Z932 Ileostomy status: Secondary | ICD-10-CM | POA: Diagnosis not present

## 2022-01-29 DIAGNOSIS — D373 Neoplasm of uncertain behavior of appendix: Secondary | ICD-10-CM | POA: Diagnosis not present

## 2022-01-29 DIAGNOSIS — E785 Hyperlipidemia, unspecified: Secondary | ICD-10-CM | POA: Diagnosis not present

## 2022-01-29 DIAGNOSIS — Z0000 Encounter for general adult medical examination without abnormal findings: Secondary | ICD-10-CM | POA: Diagnosis not present

## 2022-01-29 DIAGNOSIS — C786 Secondary malignant neoplasm of retroperitoneum and peritoneum: Secondary | ICD-10-CM | POA: Diagnosis not present

## 2022-01-29 DIAGNOSIS — Z1331 Encounter for screening for depression: Secondary | ICD-10-CM | POA: Diagnosis not present

## 2022-01-29 DIAGNOSIS — L209 Atopic dermatitis, unspecified: Secondary | ICD-10-CM | POA: Diagnosis not present

## 2022-01-29 DIAGNOSIS — M4620 Osteomyelitis of vertebra, site unspecified: Secondary | ICD-10-CM | POA: Diagnosis not present

## 2022-01-29 DIAGNOSIS — Z9081 Acquired absence of spleen: Secondary | ICD-10-CM | POA: Diagnosis not present

## 2022-01-29 DIAGNOSIS — Z8739 Personal history of other diseases of the musculoskeletal system and connective tissue: Secondary | ICD-10-CM | POA: Diagnosis not present

## 2022-01-29 DIAGNOSIS — E559 Vitamin D deficiency, unspecified: Secondary | ICD-10-CM | POA: Diagnosis not present

## 2022-01-29 DIAGNOSIS — M3500 Sicca syndrome, unspecified: Secondary | ICD-10-CM | POA: Diagnosis not present

## 2022-01-29 DIAGNOSIS — M85859 Other specified disorders of bone density and structure, unspecified thigh: Secondary | ICD-10-CM | POA: Diagnosis not present

## 2022-02-12 DIAGNOSIS — K5730 Diverticulosis of large intestine without perforation or abscess without bleeding: Secondary | ICD-10-CM | POA: Diagnosis not present

## 2022-02-12 DIAGNOSIS — Z432 Encounter for attention to ileostomy: Secondary | ICD-10-CM | POA: Diagnosis not present

## 2022-02-12 DIAGNOSIS — Z800 Family history of malignant neoplasm of digestive organs: Secondary | ICD-10-CM | POA: Diagnosis not present

## 2022-02-12 DIAGNOSIS — Z9049 Acquired absence of other specified parts of digestive tract: Secondary | ICD-10-CM | POA: Diagnosis not present

## 2022-02-12 DIAGNOSIS — Z9081 Acquired absence of spleen: Secondary | ICD-10-CM | POA: Diagnosis not present

## 2022-02-12 DIAGNOSIS — K9189 Other postprocedural complications and disorders of digestive system: Secondary | ICD-10-CM | POA: Diagnosis not present

## 2022-02-12 DIAGNOSIS — Z8249 Family history of ischemic heart disease and other diseases of the circulatory system: Secondary | ICD-10-CM | POA: Diagnosis not present

## 2022-02-12 DIAGNOSIS — Z90711 Acquired absence of uterus with remaining cervical stump: Secondary | ICD-10-CM | POA: Diagnosis not present

## 2022-02-12 DIAGNOSIS — I10 Essential (primary) hypertension: Secondary | ICD-10-CM | POA: Diagnosis not present

## 2022-02-12 DIAGNOSIS — Z515 Encounter for palliative care: Secondary | ICD-10-CM | POA: Diagnosis not present

## 2022-02-12 DIAGNOSIS — Z85048 Personal history of other malignant neoplasm of rectum, rectosigmoid junction, and anus: Secondary | ICD-10-CM | POA: Diagnosis not present

## 2022-02-12 DIAGNOSIS — Z87891 Personal history of nicotine dependence: Secondary | ICD-10-CM | POA: Diagnosis not present

## 2022-03-18 DIAGNOSIS — D373 Neoplasm of uncertain behavior of appendix: Secondary | ICD-10-CM | POA: Diagnosis not present

## 2022-03-18 DIAGNOSIS — D3A092 Benign carcinoid tumor of the stomach: Secondary | ICD-10-CM | POA: Diagnosis not present

## 2022-05-06 DIAGNOSIS — G8929 Other chronic pain: Secondary | ICD-10-CM | POA: Diagnosis not present

## 2022-05-06 DIAGNOSIS — E785 Hyperlipidemia, unspecified: Secondary | ICD-10-CM | POA: Diagnosis not present

## 2022-05-06 DIAGNOSIS — I10 Essential (primary) hypertension: Secondary | ICD-10-CM | POA: Diagnosis not present

## 2022-05-27 ENCOUNTER — Emergency Department (HOSPITAL_BASED_OUTPATIENT_CLINIC_OR_DEPARTMENT_OTHER)
Admission: EM | Admit: 2022-05-27 | Discharge: 2022-05-28 | Disposition: A | Discharge status: Other Acute Inpatient Hospital | Payer: PPO | Attending: Emergency Medicine | Admitting: Emergency Medicine

## 2022-05-27 ENCOUNTER — Encounter (HOSPITAL_BASED_OUTPATIENT_CLINIC_OR_DEPARTMENT_OTHER): Payer: Self-pay | Admitting: Emergency Medicine

## 2022-05-27 ENCOUNTER — Emergency Department (HOSPITAL_BASED_OUTPATIENT_CLINIC_OR_DEPARTMENT_OTHER): Payer: PPO

## 2022-05-27 ENCOUNTER — Other Ambulatory Visit: Payer: Self-pay

## 2022-05-27 DIAGNOSIS — K56600 Partial intestinal obstruction, unspecified as to cause: Secondary | ICD-10-CM | POA: Diagnosis not present

## 2022-05-27 DIAGNOSIS — R112 Nausea with vomiting, unspecified: Secondary | ICD-10-CM | POA: Diagnosis not present

## 2022-05-27 DIAGNOSIS — Z79899 Other long term (current) drug therapy: Secondary | ICD-10-CM | POA: Diagnosis not present

## 2022-05-27 DIAGNOSIS — N2889 Other specified disorders of kidney and ureter: Secondary | ICD-10-CM | POA: Diagnosis not present

## 2022-05-27 DIAGNOSIS — R1011 Right upper quadrant pain: Secondary | ICD-10-CM | POA: Diagnosis not present

## 2022-05-27 DIAGNOSIS — R1010 Upper abdominal pain, unspecified: Secondary | ICD-10-CM

## 2022-05-27 DIAGNOSIS — Z4682 Encounter for fitting and adjustment of non-vascular catheter: Secondary | ICD-10-CM | POA: Diagnosis not present

## 2022-05-27 DIAGNOSIS — I10 Essential (primary) hypertension: Secondary | ICD-10-CM | POA: Diagnosis not present

## 2022-05-27 LAB — COMPREHENSIVE METABOLIC PANEL
ALT: 23 U/L (ref 0–44)
AST: 33 U/L (ref 15–41)
Albumin: 4.2 g/dL (ref 3.5–5.0)
Alkaline Phosphatase: 118 U/L (ref 38–126)
Anion gap: 12 (ref 5–15)
BUN: 23 mg/dL (ref 8–23)
CO2: 24 mmol/L (ref 22–32)
Calcium: 9.5 mg/dL (ref 8.9–10.3)
Chloride: 102 mmol/L (ref 98–111)
Creatinine, Ser: 0.75 mg/dL (ref 0.44–1.00)
GFR, Estimated: >60 mL/min (ref >60)
Glucose, Bld: 154 mg/dL — ABNORMAL HIGH (ref 70–99)
Potassium: 3.5 mmol/L (ref 3.5–5.1)
Sodium: 138 mmol/L (ref 135–145)
Total Bilirubin: 0.7 mg/dL (ref 0.3–1.2)
Total Protein: 8.3 g/dL — ABNORMAL HIGH (ref 6.5–8.1)

## 2022-05-27 LAB — CBC WITH DIFFERENTIAL/PLATELET
Abs Immature Granulocytes: 0.04 10*3/uL (ref 0.00–0.07)
Basophils Absolute: 0 10*3/uL (ref 0.0–0.1)
Basophils Relative: 0 %
Eosinophils Absolute: 0 10*3/uL (ref 0.0–0.5)
Eosinophils Relative: 0 %
HCT: 40.3 % (ref 36.0–46.0)
Hemoglobin: 13.6 g/dL (ref 12.0–15.0)
Immature Granulocytes: 0 %
Lymphocytes Relative: 11 %
Lymphs Abs: 1.3 10*3/uL (ref 0.7–4.0)
MCH: 32.2 pg (ref 26.0–34.0)
MCHC: 33.7 g/dL (ref 30.0–36.0)
MCV: 95.5 fL (ref 80.0–100.0)
Monocytes Absolute: 0.7 10*3/uL (ref 0.1–1.0)
Monocytes Relative: 5 %
Neutro Abs: 10 10*3/uL — ABNORMAL HIGH (ref 1.7–7.7)
Neutrophils Relative %: 84 %
Platelets: 309 10*3/uL (ref 150–400)
RBC: 4.22 MIL/uL (ref 3.87–5.11)
RDW: 14.3 % (ref 11.5–15.5)
WBC: 12 10*3/uL — ABNORMAL HIGH (ref 4.0–10.5)
nRBC: 0 % (ref 0.0–0.2)

## 2022-05-27 LAB — URINALYSIS, ROUTINE W REFLEX MICROSCOPIC
Bilirubin Urine: NEGATIVE
Glucose, UA: NEGATIVE mg/dL
Hgb urine dipstick: NEGATIVE
Ketones, ur: 15 mg/dL — AB
Leukocytes,Ua: NEGATIVE
Nitrite: NEGATIVE
Protein, ur: NEGATIVE mg/dL
Specific Gravity, Urine: 1.015 (ref 1.005–1.030)
pH: 7.5 (ref 5.0–8.0)

## 2022-05-27 LAB — LIPASE, BLOOD: Lipase: 29 U/L (ref 11–51)

## 2022-05-27 MED ORDER — SODIUM CHLORIDE 0.9 % IV BOLUS
1000.0000 mL | Freq: Once | INTRAVENOUS | Status: AC
  Administered 2022-05-27: 1000 mL via INTRAVENOUS

## 2022-05-27 MED ORDER — IOHEXOL 300 MG/ML  SOLN
100.0000 mL | Freq: Once | INTRAMUSCULAR | Status: AC | PRN
  Administered 2022-05-27: 100 mL via INTRAVENOUS

## 2022-05-27 MED ORDER — MORPHINE SULFATE (PF) 4 MG/ML IV SOLN
4.0000 mg | Freq: Once | INTRAVENOUS | Status: AC
  Administered 2022-05-27: 4 mg via INTRAVENOUS
  Filled 2022-05-27: qty 1

## 2022-05-27 MED ORDER — ONDANSETRON HCL 4 MG/2ML IJ SOLN
4.0000 mg | Freq: Once | INTRAMUSCULAR | Status: AC
  Administered 2022-05-27: 4 mg via INTRAVENOUS
  Filled 2022-05-27: qty 2

## 2022-05-27 NOTE — ED Provider Notes (Signed)
[facility] EMERGENCY DEPARTMENT Provider Note   CSN: [id number] Arrival date & time: 05/27/22  2031     History  Chief Complaint  Patient presents with   Abdominal Pain   Emesis    Kristy Sherman is a 71 y.o. female.  Patient presents the emergency department complaining of upper abdominal pain, nausea, vomiting which began at approximately 4 30-5 o'clock this afternoon.  Patient's husband reports that he and the patient were walking dogs together at approximate 4 PM and the patient was feeling perfectly fine.  He states that approximate 4 30-5 she began to have severe pain followed by nausea and multiple episodes of emesis.  The patient denies any shortness of breath, chest pain, diarrhea, or constipation at this time.  Patient with past medical history significant for appendicitis with appendiceal neoplasm, supracervical hysterectomy, bilateral salpingo-oophorectomy, splenectomy, cholecystectomy, laminectomy, peritonectomy of the right upper quadrant, bowel obstruction with loop ileostomy.  Beginning with the appendicitis all events have occurred since 2020. Most recent surgery was with Dr. Ronnette Hila  HPI     Home Medications Prior to Admission medications   Medication Sig Start Date End Date Taking? Authorizing Provider  amLODipine (NORVASC) 10 MG tablet Take 10 mg by mouth daily.    [provider]  Aspirin (BAYER LOW DOSE PO) Take by mouth.    [provider]  atorvastatin (LIPITOR) 10 MG tablet Take 10 mg by mouth daily. 10/24/18   [provider]  Cholecalciferol (VITAMIN D3 PO) Take 1,000 Units by mouth daily.    [provider]  hyoscyamine (LEVSIN SL) 0.125 MG SL tablet Place 1 tablet (0.125 mg total) under the tongue every 4 (four) hours as needed for cramping (abdominal pain). 09/13/20   Orpah Greek, MD  lidocaine (LIDODERM) 5 % Place 1 patch onto the skin daily. Remove & Discard patch within 12 hours or as  directed by MD 02/14/19   Ward, Ozella Almond, PA-C  ondansetron (ZOFRAN ODT) 4 MG disintegrating tablet Take 1 tablet (4 mg total) by mouth every 8 (eight) hours as needed. 09/13/20   Orpah Greek, MD  oxyCODONE-acetaminophen (PERCOCET/ROXICET) 5-325 MG tablet Take 1 tablet by mouth every 6 (six) hours as needed for severe pain. 09/11/20   Horton, Barbette Hair, MD  Pediatric Multivitamins-[facility] COMPLETE PO) Take by mouth daily. 2 chewable tablets PO daily    [provider]  promethazine (PHENERGAN) 25 MG suppository Place 1 suppository (25 mg total) rectally every 6 (six) hours as needed for nausea or vomiting. 09/13/20   Orpah Greek, MD  sucralfate (CARAFATE) 1 GM/10ML suspension Take 10 mLs (1 g total) by mouth 4 (four) times daily -  with meals and at bedtime. 09/13/20   Orpah Greek, MD  traMADol (ULTRAM) 50 MG tablet Take 1-2 tablets (50-100 mg total) by mouth every 6 (six) hours as needed for moderate pain or severe pain. 11/12/18   Michael Boston, MD  traZODone (DESYREL) 50 MG tablet Take 50 mg by mouth at bedtime as needed for sleep.    [provider]      Allergies    Patient has no known allergies.    Review of Systems   Review of Systems  Respiratory:  Negative for shortness of breath.   Cardiovascular:  Negative for chest pain.  Gastrointestinal:  Positive for abdominal pain, constipation, nausea and vomiting.  Genitourinary:  Negative for dysuria.    Physical Exam Updated Vital Signs BP (!) 139/122  Pulse 92   Temp 97.8 F (36.6 C)   Resp 14   Ht '5\' 6"'$  (1.676 m)   Wt 80.7 kg   SpO2 93%   BMI 28.73 kg/m  Physical Exam Vitals and nursing note reviewed.  Constitutional:      General: She is in acute distress.     Appearance: She is normal weight.  HENT:     Head: Normocephalic and atraumatic.     Mouth/Throat:     Mouth: Mucous membranes are moist.  Eyes:     General: No scleral icterus. Cardiovascular:     Rate  and Rhythm: Normal rate and regular rhythm.     Heart sounds: Normal heart sounds.  Pulmonary:     Effort: Pulmonary effort is normal.     Breath sounds: Normal breath sounds.  Abdominal:     General: Abdomen is flat. There is no distension.     Palpations: Abdomen is soft.     Tenderness: There is abdominal tenderness in the right upper quadrant, epigastric area and left upper quadrant.  Skin:    General: Skin is warm and dry.     Capillary Refill: Capillary refill takes less than 2 seconds.  Neurological:     Mental Status: She is alert.     ED Results / Procedures / Treatments   Labs (all labs ordered are listed, but only abnormal results are displayed) Labs Reviewed  COMPREHENSIVE METABOLIC PANEL - Abnormal; Notable for the following components:      Result Value   Glucose, Bld 154 (*)    Total Protein 8.3 (*)    All other components within normal limits  URINALYSIS, ROUTINE W REFLEX MICROSCOPIC - Abnormal; Notable for the following components:   Ketones, ur 15 (*)    All other components within normal limits  CBC WITH DIFFERENTIAL/PLATELET - Abnormal; Notable for the following components:   WBC 12.0 (*)    Neutro Abs 10.0 (*)    All other components within normal limits  LIPASE, BLOOD    EKG EKG Interpretation  Date/Time:  Wednesday May 27 2022 20:48:50 EST Ventricular Rate:  80 PR Interval:  165 QRS Duration: 144 QT Interval:  431 QTC Calculation: 498 R Axis:   -67 Text Interpretation: Sinus rhythm Atrial premature complex RBBB and LAFB Confirmed by Nanda Quinton [phone]) on 05/27/2022 9:21:28 PM  Radiology CT Abdomen Pelvis W Contrast  Result Date: 05/27/2022 CLINICAL DATA:  Upper abdominal pain with nausea vomiting history of appendiceal adenocarcinoma with peritonitis and perforation status post loop ileostomy EXAM: CT ABDOMEN AND PELVIS WITH CONTRAST TECHNIQUE: Multidetector CT imaging of the abdomen and pelvis was performed using the standard protocol  following bolus administration of intravenous contrast. RADIATION DOSE REDUCTION: This exam was performed according to the departmental dose-optimization program which includes automated exposure control, adjustment of the mA and/or kV according to patient size and/or use of iterative reconstruction technique. CONTRAST:  136m OMNIPAQUE IOHEXOL 300 MG/ML  SOLN COMPARISON:  MRI 06/29/2021, CT 09/11/2020, 11/11/2018 FINDINGS: Lower chest: Lung bases demonstrate no acute airspace disease. Calcified benign subpleural nodule in the right base. Mild subpleural reticulation. Hepatobiliary: Status post cholecystectomy. Similar intra and extrahepatic biliary dilatation, common bile duct dilated up to 12 mm. Measuring 17 mm decreased from 32 mm previously. No suspicious new lesions in the liver. Pancreas: Unremarkable. No pancreatic ductal dilatation or surrounding inflammatory changes. Spleen: Splenectomy. Multiple soft tissue nodules in the left upper quadrant probably represent regenerating splenules. Adrenals/Urinary Tract: Adrenal glands are normal.  Kidneys show no hydronephrosis. Mildly prominent right renal collecting system. The bladder is unremarkable Stomach/Bowel: Stomach nonenlarged. There may be mild wall thickening of the gastric antrum. Status post appendectomy. According to prior records, patient is status post ileostomy takedown and reanastomosis. Fluid-filled dilated small bowel measuring up to 3.4 cm. Fecalized segment of small bowel in the pelvis and right upper abdomen. Adhesed appearance of small bowel to the anterior peritoneal surface. Vascular/Lymphatic: Mild aortic atherosclerosis. No aneurysm. No suspicious lymph nodes. Reproductive: Status post hysterectomy. No adnexal masses. Other: No free air. No ascites. Generalized peritoneal thickening, most evident in the right upper quadrant. Musculoskeletal: No acute osseous abnormality. There are degenerative changes. IMPRESSION: 1. Patient is status post  right abdominal ileostomy takedown and reanastomosis. Multiple fluid-filled mildly dilated loops of small bowel with several segments of fecalized appearing small bowel, findings are suspicious for at least partial bowel obstruction, possibly related to adhesive disease. 2. Status post appendectomy. Generalized peritoneal thickening, which probably corresponds to the history of HIPEC but should be correlated with the patient's interval imaging. 3. Status post cholecystectomy. Mild intra and extrahepatic biliary dilatation without significant change. 4. Splenectomy. Probable multiple splenules in the left upper quadrant Electronically Signed   By: Donavan Foil M.D.   On: 05/27/2022 22:33    Procedures Procedures    Medications Ordered in ED Medications  ondansetron [facility]) injection 4 mg (4 mg Intravenous Given 05/27/22 2130)  morphine (PF) 4 MG/ML injection 4 mg (4 mg Intravenous Given 05/27/22 2130)  sodium chloride 0.9 % bolus 1,000 mL (1,000 mLs Intravenous New Bag/Given 05/27/22 2131)  iohexol (OMNIPAQUE) 300 MG/ML solution 100 mL (100 mLs Intravenous Contrast Given 05/27/22 2146)    ED Course/ Medical Decision Making/ A&P                           Medical Decision Making Amount and/or Complexity of Data Reviewed Labs: ordered. Radiology: ordered.  Risk Prescription drug management.   This patient presents to the ED for concern of abdominal pain, nausea, vomiting, this involves an extensive number of treatment options, and is a complaint that carries with it a high risk of complications and morbidity.  The differential diagnosis includes small bowel obstruction, malignancy, liver failure, and others   Co morbidities that complicate the patient evaluation  History of multiple abdominal surgeries including splenectomy, appendectomy, cholecystectomy   Additional history obtained:  Additional history obtained from patient's husband External records from outside source obtained  and reviewed including notes from [facility] documenting patient's surgical history   Lab Tests:  I Ordered, and personally interpreted labs.  The pertinent results include: WBC 12.0, grossly unremarkable CMP, urinalysis, lipase   Imaging Studies ordered:  I ordered imaging studies including CT abdomen pelvis with contrast I independently visualized and interpreted imaging which showed  1. Patient is status post right abdominal ileostomy takedown and  reanastomosis. Multiple fluid-filled mildly dilated loops of small  bowel with several segments of fecalized appearing small bowel,  findings are suspicious for at least partial bowel obstruction,  possibly related to adhesive disease.  2. Status post appendectomy. Generalized peritoneal thickening,  which probably corresponds to the history of HIPEC but should be  correlated with the patient's interval imaging.  3. Status post cholecystectomy. Mild intra and extrahepatic biliary  dilatation without significant change.  4. Splenectomy. Probable multiple splenules in the left upper  quadrant   I agree with the radiologist interpretation   Cardiac Monitoring: /  EKG:  The patient was maintained on a cardiac monitor.  I personally viewed and interpreted the cardiac monitored which showed an underlying rhythm of: Sinus rhythm    Consultations Obtained:  I requested consultation with the Dr. Vernard Gambles with [facility], [facility], surgery, and discussed lab and imaging findings as well as pertinent plan - they recommend: admission, accept patient for transfer, recommend NG tube   Problem List / ED Course / Critical interventions / Medication management   I ordered medication including morphine and Zofran for nausea, saline for fluid resuscitation Reevaluation of the patient after these medicines showed that the patient improved I have reviewed the patients home medicines and have made adjustments as needed  Test /  Admission - Considered:  The patient has evidence on CT scan of possible partial small bowel obstruction.  Patient is extensive surgical history involving the abdomen and would benefit from admission for further evaluation and management of possible SBO. NG tube ordered         Final Clinical Impression(s) / ED Diagnoses Final diagnoses:  Pain of upper abdomen  Partial small bowel obstruction (HCC)  Nausea and vomiting, unspecified vomiting type    Rx / DC Orders ED Discharge Orders     None         Ronny Bacon 05/28/22 0004    Margette Fast, MD 05/29/22 [phone]

## 2022-05-27 NOTE — ED Notes (Signed)
Notified PT urine sample is needed,

## 2022-05-27 NOTE — ED Triage Notes (Signed)
Upper abdominal pain and n/v since 5 pm today. Vomit is yellow with food. LBM this morning - normal. Denies urinary sx.

## 2022-05-28 ENCOUNTER — Emergency Department (HOSPITAL_BASED_OUTPATIENT_CLINIC_OR_DEPARTMENT_OTHER): Payer: PPO

## 2022-05-28 DIAGNOSIS — Z4682 Encounter for fitting and adjustment of non-vascular catheter: Secondary | ICD-10-CM | POA: Diagnosis not present

## 2022-05-28 DIAGNOSIS — K56600 Partial intestinal obstruction, unspecified as to cause: Secondary | ICD-10-CM | POA: Diagnosis not present

## 2022-05-28 DIAGNOSIS — Z85038 Personal history of other malignant neoplasm of large intestine: Secondary | ICD-10-CM | POA: Diagnosis not present

## 2022-05-28 DIAGNOSIS — Z4659 Encounter for fitting and adjustment of other gastrointestinal appliance and device: Secondary | ICD-10-CM | POA: Diagnosis not present

## 2022-05-28 DIAGNOSIS — I10 Essential (primary) hypertension: Secondary | ICD-10-CM | POA: Diagnosis not present

## 2022-05-28 DIAGNOSIS — K56609 Unspecified intestinal obstruction, unspecified as to partial versus complete obstruction: Secondary | ICD-10-CM | POA: Diagnosis not present

## 2022-05-28 DIAGNOSIS — Z9221 Personal history of antineoplastic chemotherapy: Secondary | ICD-10-CM | POA: Diagnosis not present

## 2022-05-28 DIAGNOSIS — D373 Neoplasm of uncertain behavior of appendix: Secondary | ICD-10-CM | POA: Diagnosis not present

## 2022-05-28 DIAGNOSIS — E7800 Pure hypercholesterolemia, unspecified: Secondary | ICD-10-CM | POA: Diagnosis not present

## 2022-05-28 DIAGNOSIS — Z79899 Other long term (current) drug therapy: Secondary | ICD-10-CM | POA: Diagnosis not present

## 2022-05-28 DIAGNOSIS — Z9049 Acquired absence of other specified parts of digestive tract: Secondary | ICD-10-CM | POA: Diagnosis not present

## 2022-05-28 DIAGNOSIS — K5651 Intestinal adhesions [bands], with partial obstruction: Secondary | ICD-10-CM | POA: Diagnosis not present

## 2022-05-28 DIAGNOSIS — Z87891 Personal history of nicotine dependence: Secondary | ICD-10-CM | POA: Diagnosis not present

## 2022-05-28 DIAGNOSIS — Z9081 Acquired absence of spleen: Secondary | ICD-10-CM | POA: Diagnosis not present

## 2022-05-28 DIAGNOSIS — Z90710 Acquired absence of both cervix and uterus: Secondary | ICD-10-CM | POA: Diagnosis not present

## 2022-05-28 MED ORDER — MORPHINE SULFATE (PF) 4 MG/ML IV SOLN
4.0000 mg | Freq: Once | INTRAVENOUS | Status: AC
  Administered 2022-05-28: 4 mg via INTRAVENOUS
  Filled 2022-05-28: qty 1

## 2022-05-28 MED ORDER — LIDOCAINE VISCOUS HCL 2 % MT SOLN
15.0000 mL | Freq: Once | OROMUCOSAL | Status: AC
  Administered 2022-05-28: 15 mL via OROMUCOSAL
  Filled 2022-05-28: qty 15

## 2022-06-23 DIAGNOSIS — Z01411 Encounter for gynecological examination (general) (routine) with abnormal findings: Secondary | ICD-10-CM | POA: Diagnosis not present

## 2022-06-23 DIAGNOSIS — R32 Unspecified urinary incontinence: Secondary | ICD-10-CM | POA: Diagnosis not present

## 2022-06-23 DIAGNOSIS — Z6829 Body mass index (BMI) 29.0-29.9, adult: Secondary | ICD-10-CM | POA: Diagnosis not present

## 2022-06-23 DIAGNOSIS — N895 Stricture and atresia of vagina: Secondary | ICD-10-CM | POA: Diagnosis not present

## 2022-06-23 DIAGNOSIS — Z1231 Encounter for screening mammogram for malignant neoplasm of breast: Secondary | ICD-10-CM | POA: Diagnosis not present

## 2022-07-07 DIAGNOSIS — U071 COVID-19: Secondary | ICD-10-CM | POA: Diagnosis not present

## 2022-07-17 DIAGNOSIS — Z23 Encounter for immunization: Secondary | ICD-10-CM | POA: Diagnosis not present

## 2022-07-17 DIAGNOSIS — D373 Neoplasm of uncertain behavior of appendix: Secondary | ICD-10-CM | POA: Diagnosis not present

## 2022-07-17 DIAGNOSIS — C786 Secondary malignant neoplasm of retroperitoneum and peritoneum: Secondary | ICD-10-CM | POA: Diagnosis not present

## 2022-07-17 DIAGNOSIS — U071 COVID-19: Secondary | ICD-10-CM | POA: Diagnosis not present

## 2022-07-17 DIAGNOSIS — I10 Essential (primary) hypertension: Secondary | ICD-10-CM | POA: Diagnosis not present

## 2022-07-20 ENCOUNTER — Ambulatory Visit: Payer: PPO | Attending: Obstetrics & Gynecology

## 2022-07-20 ENCOUNTER — Other Ambulatory Visit: Payer: Self-pay

## 2022-07-20 DIAGNOSIS — N393 Stress incontinence (female) (male): Secondary | ICD-10-CM | POA: Diagnosis not present

## 2022-07-20 DIAGNOSIS — M6281 Muscle weakness (generalized): Secondary | ICD-10-CM | POA: Diagnosis not present

## 2022-07-20 DIAGNOSIS — M62838 Other muscle spasm: Secondary | ICD-10-CM | POA: Diagnosis not present

## 2022-07-20 DIAGNOSIS — R293 Abnormal posture: Secondary | ICD-10-CM | POA: Insufficient documentation

## 2022-07-20 DIAGNOSIS — N941 Unspecified dyspareunia: Secondary | ICD-10-CM | POA: Insufficient documentation

## 2022-07-20 DIAGNOSIS — R279 Unspecified lack of coordination: Secondary | ICD-10-CM | POA: Diagnosis not present

## 2022-07-20 NOTE — Patient Instructions (Signed)
Scar tissue mobilization: The first phase of working on scar tissue is without lotion or oil. You will develop a feel for where your scar is restricted, or not moving as much as the skin around it. These are the areas you want to focus on. Begin by placing fingers on scar tissue; gently press the skin gently in one direction. Do not let your fingers slide over the skin, but pull any adhesions under it. Maintain pressure for several seconds. Perform this technique in different directions over each restricted spot you find. Pressure does not need to be intense, but only comfortable. Perform for 5-10 minutes daily.  Scar tissue desensitization: In order to decrease the sensitivity of scar tissue, use oil or lotion (perform after scar tissue mobilization). Very gently massage over and around scar, not increasing discomfort at all. Perform for several minutes daily.    The knack: Use this technique while coughing, laughing, sneezing, or with any activities that causes you to leak urine a little. Right before you perform one of these activities that increase pressure in the abdomen and pushes a little urine out, perform a pelvic floor muscle contraction and hold. If that does not completely stop the leaking, try tightening your thighs together in addition to performing a pelvic floor muscle contraction. Make sure you are not trying to stifle a cough, sneeze, or laugh; allow these activities in full as it will cause less pressure down into the bladder and pelvic floor muscles.   [facility] [address] [place], [place] [address] Phone # [phone] Fax [phone]

## 2022-07-20 NOTE — Therapy (Signed)
OUTPATIENT PHYSICAL THERAPY FEMALE PELVIC EVALUATION   Patient Name: Kristy Sherman MRN: [id number] DOB:1950-12-05, 72 y.o., female Today's Date: 07/20/2022  END OF SESSION:  PT End of Session - 07/20/22 1530     Visit Number 1    Date for PT Re-Evaluation 10/12/22    Authorization Type [employer]    PT Start Time 1530    PT Stop Time 1610    PT Time Calculation (min) 40 min    Activity Tolerance Patient tolerated treatment well    Behavior During Therapy [facility] for tasks assessed/performed             Past Medical History:  Diagnosis Date   ANKLE PAIN, LEFT 10/17/2007   Qualifier: Diagnosis of  By: Jerold Coombe     Hyperlipidemia    Hypertension    Obesity    Sjoegren syndrome    Past Surgical History:  Procedure Laterality Date   ABDOMINAL HYSTERECTOMY     with HIPEC   ABDOMINAL [place] N/A 11/11/2018   Procedure: APPENDECTOMY LAPAROSCOPIC;  Surgeon: Michael Boston, MD;  Location: [facility] ORS;  Service: General;  Laterality: N/A;   SPLENECTOMY, TOTAL     Patient Active Problem List   Diagnosis Date Noted   Elevated immunoglobulin A 05/21/2021   Abnormality of plasma protein 05/05/2021   Abnormal serum protein test 04/22/2021   Hypercalcemia 04/15/2021   Dyspareunia in female 11/11/2018   Atrophic vaginitis 11/11/2018   Acute perforated appendicitis 11/11/2018   Acute appendicitis with perforation and localized peritonitis 11/11/2018   Sjogren's syndrome ([place])    Obesity    Diverticulosis of colon  11/10/2012   Hyperlipidemia 02/24/2007   Essential hypertension 02/24/2007    PCP: Leeroy Cha, MD  REFERRING PROVIDER: Waymon Amato, MD  REFERRING DIAG: N89.5 (ICD-10-CM) - Stricture and atresia of vagina  THERAPY DIAG:  Muscle weakness (generalized)  Abnormal posture  Other muscle spasm  Unspecified lack of coordination  Dyspareunia,  female  Stress incontinence (female) (female)  Rationale for Evaluation and Treatment: Rehabilitation  ONSET DATE: 3-4 years ago  SUBJECTIVE:                                                                                                                                                                                           07/20/22 SUBJECTIVE STATEMENT: Pt states that she is here due to painful intercourse and controlling her bladder (3-4 years). She started estradiol cream, but stopped taking it until just recently.  Fluid intake: Yes: a lot of water  PAIN:  Are you having pain? Yes NPRS scale: 10/10 Pain location: Vaginal  Pain type: burning, sharp, and tight Pain description: intermittent   Aggravating factors: intercourse Relieving factors: no vaginal penetration  PRECAUTIONS: None  WEIGHT BEARING RESTRICTIONS: No  FALLS:  Has patient fallen in last 6 months? No  LIVING ENVIRONMENT: Lives with: lives with their family Lives in: House/apartment  OCCUPATION: retired  PLOF: Independent  PATIENT GOALS: to be able to have intimacy if possible; not to wear pad for urinary incontinence  PERTINENT HISTORY:  Abdominal hysterectomy/splenectomy/peritonectomy (Rt upper quadrant)/cholecystectomy with antineoplastic agent administered 02/19/20, ileostomy for small bowel obstruction and lysis of adhesions 09/2020, c-section x3 Sexual abuse: No  BOWEL MOVEMENT: Pain with bowel movement: No Type of bowel movement:Frequency daily and Strain No Fully empty rectum: Yes: sometimes takes more than one trip to the bathroom Leakage: No Pads: No Fiber supplement: No *used to have constipation issues - just had ileostomy reversal August 3rd  URINATION: Pain with urination: No Fully empty bladder: Yes: she will leak after urinating though/post void dribbling Stream: Strong Urgency: No Frequency: 3-5x/day Leakage: Coughing, Sneezing, Laughing, and for no reason Pads: Yes: 1 during  the day and 1 at night  INTERCOURSE: Pain with intercourse: Initial Penetration and During Penetration Ability to have vaginal penetration:  Yes: very painful Climax: yes Marinoff Scale: 3/3 *using uberlube  PREGNANCY: Vaginal deliveries 0 Tearing No C-section deliveries 3 Currently pregnant No  PROLAPSE: None   OBJECTIVE:  07/20/22:  COGNITION: Overall cognitive status: Within functional limits for tasks assessed     SENSATION: Light touch: Appears intact Proprioception: Appears intact  MUSCLE LENGTH:   FUNCTIONAL TESTS:    GAIT: Comments: Lt trendelenburg  POSTURE: rounded shoulders, forward head, decreased lumbar lordosis, increased thoracic kyphosis, and posterior pelvic tilt  PELVIC ALIGNMENT:  LUMBARAROM/PROM:  A/PROM A/PROM  eval  Flexion   Extension   Right lateral flexion   Left lateral flexion   Right rotation   Left rotation    (Blank rows = not tested)  LOWER EXTREMITY ROM:   LOWER EXTREMITY MMT:  MMT Right eval Left eval  Hip flexion    Hip extension    Hip abduction    Hip adduction    Hip internal rotation    Hip external rotation    Knee flexion    Knee extension    Ankle dorsiflexion    Ankle plantarflexion    Ankle inversion    Ankle eversion     PALPATION:   General  Significant abdominal scar tissue and restriction, especially over bladder                External Perineal Exam WNL                             Internal Pelvic Floor restriction and burning throughout superficial pelvic floor; restriction/high tone in all deep layers, but no discomfort reported; flexed coccyx  Patient confirms identification and approves PT to assess internal pelvic floor and treatment Yes  PELVIC MMT:   MMT eval  Vaginal 3/5, 3 second endurance, 5 repeat contraction with poor coordination and difficulty with full relaxation  Internal Anal Sphincter   External Anal Sphincter   Puborectalis   Diastasis Recti 1 finger width  separation above umbilicus - difficulty assessing lower due to extensive scar tissue  (Blank rows = not tested)        TONE: high  PROLAPSE: Unable  to see any this treatment session  TODAY'S TREATMENT:                                                                                                                              DATE: 07/20/22  EVAL  Neuromuscular re-education: Pelvic floor contraction training with breath coordination Practice the knack with internal feedback Therapeutic activities: The Recruitment consultant    PATIENT EDUCATION:  Education details: see above Person educated: Patient Education method: Explanation, Demonstration, Tactile cues, Verbal cues, and Handouts Education comprehension: verbalized understanding  HOME EXERCISE PROGRAM: [id number]  ASSESSMENT:  CLINICAL IMPRESSION: Patient is a 72 y.o. female who was seen today for physical therapy evaluation and treatment for dyspareunia and stress urinary incontinence. Exam findings notable for abnormal posture, abnormal gait, decreasedpelvic floor strength 3/5, decreased pelvic floor endurance 3 seconds, poor coordination with repeat contractions, difficulty with full relaxation of pelvic floor, introital restriction, pain and tenderness with burning throughout superficial pelvic floor muscles, and high tone deep pelvic floor muscles. Signs and symptoms are most consistent with poor pelvic floor muscle motor control, increased pelvic floor muscle tone, and abdominal scar tissue restriction that is impacting pelvic floor. Initial treatment consisted of pelvic floor A/ROM training with breath coordination for improved proprioception, dilator education, the knack, and self-abdominal scar tissue mobilization. Pt will continue to benefit from skilled PT intervention in order to decrease stress urinary incontinence, improve dyspareunia, and address impairments.   OBJECTIVE IMPAIRMENTS: decreased  activity tolerance, decreased coordination, decreased endurance, decreased strength, hypomobility, increased fascial restrictions, increased muscle spasms, impaired tone, postural dysfunction, and pain.   ACTIVITY LIMITATIONS: continence  PARTICIPATION LIMITATIONS: interpersonal relationship  PERSONAL FACTORS: 3+ comorbidities: Abdominal hysterectomy/splenectomy/peritonectomy (Rt upper quadrant)/cholecystectomy with antineoplastic agent administered 02/19/20, ileostomy for small bowel obstruction and lysis of adhesions 09/2020, c-section x3  are also affecting patient's functional outcome.   REHAB POTENTIAL: Good  CLINICAL DECISION MAKING: Stable/uncomplicated  EVALUATION COMPLEXITY: Low   GOALS: Goals reviewed with patient? Yes  SHORT TERM GOALS: Target date: 08/27/22  Pt will be independent with HEP.   Baseline: Goal status: INITIAL  2.  Pt will be independent with the knack, urge suppression technique, and double voiding in order to improve bladder habits and decrease urinary incontinence.   Baseline:  Goal status: INITIAL  3.  Pt will be independent with diaphragmatic breathing and down training activities in order to improve pelvic floor relaxation.   LONG TERM GOALS: Target date: 10/12/22  Pt will be independent with HEP.   Baseline:  Goal status: INITIAL  2.  Pt will demonstrate normal pelvic floor muscle tone and A/ROM, able to achieve 4/5 strength with contractions and 10 sec endurance, in order to provide appropriate lumbopelvic support in functional activities.   Baseline:  Goal status: INITIAL  3.  Pt will report no leaks with laughing, coughing, sneezing in order to improve comfort with interpersonal relationships and community activities.   Baseline:  Goal  status: INITIAL  4.  Pt will report 0/10 pain with vaginal penetration in order to improve intimate relationship with partner.    Baseline:  Goal status: INITIAL    PLAN:  PT FREQUENCY:  1-2x/week  PT DURATION: 12 weeks  PLANNED INTERVENTIONS: Therapeutic exercises, Therapeutic activity, Neuromuscular re-education, Balance training, Gait training, Patient/Family education, Self Care, Joint mobilization, Dry Needling, Biofeedback, and Manual therapy  PLAN FOR NEXT SESSION: double voiding; toileting mechanics; core training; scar tissue mobilization in abdomen with focus over bladder   Heather Roberts, PT, DPT01/08/245:16 PM

## 2022-08-26 ENCOUNTER — Ambulatory Visit: Payer: PPO | Attending: Obstetrics & Gynecology

## 2022-08-26 DIAGNOSIS — M6281 Muscle weakness (generalized): Secondary | ICD-10-CM | POA: Insufficient documentation

## 2022-08-26 DIAGNOSIS — N393 Stress incontinence (female) (male): Secondary | ICD-10-CM | POA: Insufficient documentation

## 2022-08-26 DIAGNOSIS — N941 Unspecified dyspareunia: Secondary | ICD-10-CM | POA: Insufficient documentation

## 2022-08-26 DIAGNOSIS — R279 Unspecified lack of coordination: Secondary | ICD-10-CM | POA: Diagnosis not present

## 2022-08-26 DIAGNOSIS — M62838 Other muscle spasm: Secondary | ICD-10-CM | POA: Diagnosis not present

## 2022-08-26 DIAGNOSIS — R293 Abnormal posture: Secondary | ICD-10-CM | POA: Diagnosis not present

## 2022-08-26 NOTE — Therapy (Signed)
OUTPATIENT PHYSICAL THERAPY TREATMENT NOTE   Patient Name: Kristy Sherman MRN: [id number] DOB:1950-10-01, 72 y.o., female Today's Date: 08/26/2022  PCP: Leeroy Cha, MD REFERRING PROVIDER: Waymon Amato, MD  END OF SESSION:   PT End of Session - 08/26/22 1404     Visit Number 2    Date for PT Re-Evaluation 10/12/22    Authorization Type [employer]    PT Start Time 1415    PT Stop Time 1500    PT Time Calculation (min) 45 min    Activity Tolerance Patient tolerated treatment well    Behavior During Therapy [facility] for tasks assessed/performed             Past Medical History:  Diagnosis Date   ANKLE PAIN, LEFT 10/17/2007   Qualifier: Diagnosis of  By: Jerold Coombe     Hyperlipidemia    Hypertension    Obesity    Sjoegren syndrome    Past Surgical History:  Procedure Laterality Date   ABDOMINAL HYSTERECTOMY     with [place], 1982, [place] N/A 11/11/2018   Procedure: APPENDECTOMY LAPAROSCOPIC;  Surgeon: Michael Boston, MD;  Location: [facility];  Service: General;  Laterality: N/A;   SPLENECTOMY, TOTAL     Patient Active Problem List   Diagnosis Date Noted   Elevated immunoglobulin A 05/21/2021   Abnormality of plasma protein 05/05/2021   Abnormal serum protein test 04/22/2021   Hypercalcemia 04/15/2021   Dyspareunia in female 11/11/2018   Atrophic vaginitis 11/11/2018   Acute perforated appendicitis 11/11/2018   Acute appendicitis with perforation and localized peritonitis 11/11/2018   Sjogren's syndrome ([place])    Obesity    Diverticulosis of colon  11/10/2012   Hyperlipidemia 02/24/2007   Essential hypertension 02/24/2007    REFERRING DIAG: N89.5 (ICD-10-CM) - Stricture and atresia of vagina  THERAPY DIAG:  Muscle weakness (generalized)  Abnormal posture  Other muscle spasm  Unspecified lack of coordination  Stress incontinence (female)  (female)  Rationale for Evaluation and Treatment Rehabilitation  PERTINENT HISTORY: Abdominal hysterectomy/splenectomy/peritonectomy (Rt upper quadrant)/cholecystectomy with antineoplastic agent administered 02/19/20, ileostomy for small bowel obstruction and lysis of adhesions 09/2020, c-section x3  PRECAUTIONS: hx cancer  SUBJECTIVE:                                                                                                                                                                                      SUBJECTIVE STATEMENT:  Pt states that she has been trying to do her homework. She performs some scar tissue massage 3-4x/week and has purchased  dilators and has been using very successfully with breathing. She is ready for larger dilators. She has also been working on pelvic floor contractions.    PAIN:  Are you having pain? No                  07/20/22 SUBJECTIVE STATEMENT: Pt states that she is here due to painful intercourse and controlling her bladder (3-4 years). She started estradiol cream, but stopped taking it until just recently.  Fluid intake: Yes: a lot of water     PAIN:  Are you having pain? Yes NPRS scale: 10/10 Pain location: Vaginal   Pain type: burning, sharp, and tight Pain description: intermittent    Aggravating factors: intercourse Relieving factors: no vaginal penetration   PRECAUTIONS: None   WEIGHT BEARING RESTRICTIONS: No   FALLS:  Has patient fallen in last 6 months? No   LIVING ENVIRONMENT: Lives with: lives with their family Lives in: House/apartment   OCCUPATION: retired   PLOF: Independent   PATIENT GOALS: to be able to have intimacy if possible; not to wear pad for urinary incontinence   PERTINENT HISTORY:  Abdominal hysterectomy/splenectomy/peritonectomy (Rt upper quadrant)/cholecystectomy with antineoplastic agent administered 02/19/20, ileostomy for small bowel obstruction and lysis of adhesions 09/2020, c-section x3 Sexual abuse:  No   BOWEL MOVEMENT: Pain with bowel movement: No Type of bowel movement:Frequency daily and Strain No Fully empty rectum: Yes: sometimes takes more than one trip to the bathroom Leakage: No Pads: No Fiber supplement: No *used to have constipation issues - just had ileostomy reversal August 3rd   URINATION: Pain with urination: No Fully empty bladder: Yes: she will leak after urinating though/post void dribbling Stream: Strong Urgency: No Frequency: 3-5x/day Leakage: Coughing, Sneezing, Laughing, and for no reason Pads: Yes: 1 during the day and 1 at night   INTERCOURSE: Pain with intercourse: Initial Penetration and During Penetration Ability to have vaginal penetration:  Yes: very painful Climax: yes Marinoff Scale: 3/3 *using uberlube   PREGNANCY: Vaginal deliveries 0 Tearing No C-section deliveries 3 Currently pregnant No   PROLAPSE: None     OBJECTIVE:  07/20/22:   COGNITION: Overall cognitive status: Within functional limits for tasks assessed                          SENSATION: Light touch: Appears intact Proprioception: Appears intact   MUSCLE LENGTH:     FUNCTIONAL TESTS:      GAIT: Comments: Lt trendelenburg   POSTURE: rounded shoulders, forward head, decreased lumbar lordosis, increased thoracic kyphosis, and posterior pelvic tilt   PELVIC ALIGNMENT:   LUMBARAROM/PROM:   A/PROM A/PROM  eval  Flexion    Extension    Right lateral flexion    Left lateral flexion    Right rotation    Left rotation     (Blank rows = not tested)   LOWER EXTREMITY ROM:     LOWER EXTREMITY MMT:   MMT Right eval Left eval  Hip flexion      Hip extension      Hip abduction      Hip adduction      Hip internal rotation      Hip external rotation      Knee flexion      Knee extension      Ankle dorsiflexion      Ankle plantarflexion      Ankle inversion      Ankle eversion  PALPATION:   General  Significant abdominal scar tissue and  restriction, especially over bladder                 External Perineal Exam WNL                             Internal Pelvic Floor restriction and burning throughout superficial pelvic floor; restriction/high tone in all deep layers, but no discomfort reported; flexed coccyx   Patient confirms identification and approves PT to assess internal pelvic floor and treatment Yes   PELVIC MMT:   MMT eval  Vaginal 3/5, 3 second endurance, 5 repeat contraction with poor coordination and difficulty with full relaxation  Internal Anal Sphincter    External Anal Sphincter    Puborectalis    Diastasis Recti 1 finger width separation above umbilicus - difficulty assessing lower due to extensive scar tissue  (Blank rows = not tested)         TONE: high   PROLAPSE: Unable to see any this treatment session   TODAY'S TREATMENT 08/26/22: Manual: Abdominal scar tissue mobilization Diaphragm release Abdominal soft tissue mobilization Exercises: Open books 10x bil Cat cow 10x Kneeling hip flexor stretch 60 sec bil Bent knee fall out 5x bil Therapeutic activities: Regular orgasm (at least 1x/week) Lubricant discussion - ordered intimate rose Foreplay activities to help with desensitization                                                                                                                               DATE: 07/20/22  EVAL  Neuromuscular re-education: Pelvic floor contraction training with breath coordination Practice the knack with internal feedback Therapeutic activities: The Recruitment consultant       PATIENT EDUCATION:  Education details: see above Person educated: Patient Education method: Explanation, Demonstration, Tactile cues, Verbal cues, and Handouts Education comprehension: verbalized understanding   HOME EXERCISE PROGRAM: [id number]   ASSESSMENT:   CLINICAL IMPRESSION: Pt is doing very well with having started dilators and regularly  working on abdominal scar massage. She has attempted intercourse and not seen any improvement. We discussed that she should begin letting husband help with dilator desensitization and engage in more desensitization/foreplay techniques leading up to vaginal penetration. Abdominal scar tissue and soft tissue mobilization performed with great tolerance and improvement in mobility; more restriction noted in Lt upper quadrant. She did well with initial mobility activities. Lubricant samples given. Pt will continue to benefit from skilled PT intervention in order to decrease stress urinary incontinence, improve dyspareunia, and address impairments.    OBJECTIVE IMPAIRMENTS: decreased activity tolerance, decreased coordination, decreased endurance, decreased strength, hypomobility, increased fascial restrictions, increased muscle spasms, impaired tone, postural dysfunction, and pain.    ACTIVITY LIMITATIONS: continence   PARTICIPATION LIMITATIONS: interpersonal relationship   PERSONAL FACTORS: 3+ comorbidities: Abdominal hysterectomy/splenectomy/peritonectomy (Rt upper quadrant)/cholecystectomy with antineoplastic agent administered 02/19/20, ileostomy for small bowel  obstruction and lysis of adhesions 09/2020, c-section x3  are also affecting patient's functional outcome.    REHAB POTENTIAL: Good   CLINICAL DECISION MAKING: Stable/uncomplicated   EVALUATION COMPLEXITY: Low     GOALS: Goals reviewed with patient? Yes   SHORT TERM GOALS: Target date: 08/27/22   Pt will be independent with HEP.    Baseline: Goal status: INITIAL   2.  Pt will be independent with the knack, urge suppression technique, and double voiding in order to improve bladder habits and decrease urinary incontinence.    Baseline:  Goal status: INITIAL   3.  Pt will be independent with diaphragmatic breathing and down training activities in order to improve pelvic floor relaxation.     LONG TERM GOALS: Target date: 10/12/22    Pt will be independent with HEP.    Baseline:  Goal status: INITIAL   2.  Pt will demonstrate normal pelvic floor muscle tone and A/ROM, able to achieve 4/5 strength with contractions and 10 sec endurance, in order to provide appropriate lumbopelvic support in functional activities.    Baseline:  Goal status: INITIAL   3.  Pt will report no leaks with laughing, coughing, sneezing in order to improve comfort with interpersonal relationships and community activities.    Baseline:  Goal status: INITIAL   4.  Pt will report 0/10 pain with vaginal penetration in order to improve intimate relationship with partner.     Baseline:  Goal status: INITIAL       PLAN:   PT FREQUENCY: 1-2x/week   PT DURATION: 12 weeks   PLANNED INTERVENTIONS: Therapeutic exercises, Therapeutic activity, Neuromuscular re-education, Balance training, Gait training, Patient/Family education, Self Care, Joint mobilization, Dry Needling, Biofeedback, and Manual therapy   PLAN FOR NEXT SESSION: double voiding; toileting mechanics; core training; scar tissue mobilization in abdomen with focus over bladder  Heather Roberts, PT, DPT02/14/243:11 PM

## 2022-09-02 ENCOUNTER — Ambulatory Visit: Payer: PPO

## 2022-09-02 DIAGNOSIS — R293 Abnormal posture: Secondary | ICD-10-CM

## 2022-09-02 DIAGNOSIS — M6281 Muscle weakness (generalized): Secondary | ICD-10-CM | POA: Diagnosis not present

## 2022-09-02 DIAGNOSIS — M62838 Other muscle spasm: Secondary | ICD-10-CM

## 2022-09-02 DIAGNOSIS — N393 Stress incontinence (female) (male): Secondary | ICD-10-CM

## 2022-09-02 DIAGNOSIS — R279 Unspecified lack of coordination: Secondary | ICD-10-CM

## 2022-09-02 DIAGNOSIS — N941 Unspecified dyspareunia: Secondary | ICD-10-CM

## 2022-09-02 NOTE — Therapy (Signed)
OUTPATIENT PHYSICAL THERAPY TREATMENT NOTE   Patient Name: Kristy Sherman MRN: [id number] DOB:[birth year], 72 y.o., female Today's Date: 09/02/2022  PCP: Leeroy Cha, MD REFERRING PROVIDER: Waymon Amato, MD  END OF SESSION:   PT End of Session - 09/02/22 1449     Visit Number 3    Date for PT Re-Evaluation 10/12/22    Authorization Type [employer]    PT Start Time 1445    PT Stop Time 1525    PT Time Calculation (min) 40 min    Activity Tolerance Patient tolerated treatment well    Behavior During Therapy [facility] for tasks assessed/performed              Past Medical History:  Diagnosis Date   ANKLE PAIN, LEFT 10/17/2007   Qualifier: Diagnosis of  By: Jerold Coombe     Hyperlipidemia    Hypertension    Obesity    Sjoegren syndrome    Past Surgical History:  Procedure Laterality Date   ABDOMINAL HYSTERECTOMY     with [facility]   ABDOMINAL [place], 1982, 1986   CHOLECYSTECTOMY     LAPAROSCOPIC APPENDECTOMY N/A 11/11/2018   Procedure: APPENDECTOMY LAPAROSCOPIC;  Surgeon: Michael Boston, MD;  Location: [facility];  Service: General;  Laterality: N/A;   SPLENECTOMY, TOTAL     Patient Active Problem List   Diagnosis Date Noted   Elevated immunoglobulin A 05/21/2021   Abnormality of plasma protein 05/05/2021   Abnormal serum protein test 04/22/2021   Hypercalcemia 04/15/2021   Dyspareunia in female 11/11/2018   Atrophic vaginitis 11/11/2018   Acute perforated appendicitis 11/11/2018   Acute appendicitis with perforation and localized peritonitis 11/11/2018   Sjogren's syndrome ([place])    Obesity    Diverticulosis of colon  11/10/2012   Hyperlipidemia 02/24/2007   Essential hypertension 02/24/2007    REFERRING DIAG: N89.5 (ICD-10-CM) - Stricture and atresia of vagina  THERAPY DIAG:  Muscle weakness (generalized)  Abnormal posture  Other muscle spasm  Unspecified lack of coordination  Stress incontinence (female)  (female)  Dyspareunia, female  Rationale for Evaluation and Treatment Rehabilitation  PERTINENT HISTORY: Abdominal hysterectomy/splenectomy/peritonectomy (Rt upper quadrant)/cholecystectomy with antineoplastic agent administered 02/19/20, ileostomy for small bowel obstruction and lysis of adhesions 09/2020, c-section x3  PRECAUTIONS: hx cancer  SUBJECTIVE:                                                                                                                                                                                      SUBJECTIVE STATEMENT:  Pt states that she has been doing exercises and gets a little sore when she is doing  them. She tried using dilator prior to intercourse and she feels like it was helpful. She states that initial penetration was painful, but then she was able to tolerate to completion afterwards.    PAIN:  Are you having pain? No                  07/20/22 SUBJECTIVE STATEMENT: Pt states that she is here due to painful intercourse and controlling her bladder (3-4 years). She started estradiol cream, but stopped taking it until just recently.  Fluid intake: Yes: a lot of water     PAIN:  Are you having pain? Yes NPRS scale: 10/10 Pain location: Vaginal   Pain type: burning, sharp, and tight Pain description: intermittent    Aggravating factors: intercourse Relieving factors: no vaginal penetration   PRECAUTIONS: None   WEIGHT BEARING RESTRICTIONS: No   FALLS:  Has patient fallen in last 6 months? No   LIVING ENVIRONMENT: Lives with: lives with their family Lives in: House/apartment   OCCUPATION: retired   PLOF: Independent   PATIENT GOALS: to be able to have intimacy if possible; not to wear pad for urinary incontinence   PERTINENT HISTORY:  Abdominal hysterectomy/splenectomy/peritonectomy (Rt upper quadrant)/cholecystectomy with antineoplastic agent administered 02/19/20, ileostomy for small bowel obstruction and lysis of adhesions 09/2020,  c-section x3 Sexual abuse: No   BOWEL MOVEMENT: Pain with bowel movement: No Type of bowel movement:Frequency daily and Strain No Fully empty rectum: Yes: sometimes takes more than one trip to the bathroom Leakage: No Pads: No Fiber supplement: No *used to have constipation issues - just had ileostomy reversal August 3rd   URINATION: Pain with urination: No Fully empty bladder: Yes: she will leak after urinating though/post void dribbling Stream: Strong Urgency: No Frequency: 3-5x/day Leakage: Coughing, Sneezing, Laughing, and for no reason Pads: Yes: 1 during the day and 1 at night   INTERCOURSE: Pain with intercourse: Initial Penetration and During Penetration Ability to have vaginal penetration:  Yes: very painful Climax: yes Marinoff Scale: 3/3 *using uberlube   PREGNANCY: Vaginal deliveries 0 Tearing No C-section deliveries 3 Currently pregnant No   PROLAPSE: None     OBJECTIVE:  07/20/22:   COGNITION: Overall cognitive status: Within functional limits for tasks assessed                          SENSATION: Light touch: Appears intact Proprioception: Appears intact   MUSCLE LENGTH:     FUNCTIONAL TESTS:      GAIT: Comments: Lt trendelenburg   POSTURE: rounded shoulders, forward head, decreased lumbar lordosis, increased thoracic kyphosis, and posterior pelvic tilt   PELVIC ALIGNMENT:   LUMBARAROM/PROM:   A/PROM A/PROM  eval  Flexion    Extension    Right lateral flexion    Left lateral flexion    Right rotation    Left rotation     (Blank rows = not tested)   LOWER EXTREMITY ROM:     LOWER EXTREMITY MMT:   MMT Right eval Left eval  Hip flexion      Hip extension      Hip abduction      Hip adduction      Hip internal rotation      Hip external rotation      Knee flexion      Knee extension      Ankle dorsiflexion      Ankle plantarflexion      Ankle inversion  Ankle eversion        PALPATION:   General  Significant  abdominal scar tissue and restriction, especially over bladder                 External Perineal Exam WNL                             Internal Pelvic Floor restriction and burning throughout superficial pelvic floor; restriction/high tone in all deep layers, but no discomfort reported; flexed coccyx   Patient confirms identification and approves PT to assess internal pelvic floor and treatment Yes   PELVIC MMT:   MMT eval  Vaginal 3/5, 3 second endurance, 5 repeat contraction with poor coordination and difficulty with full relaxation  Internal Anal Sphincter    External Anal Sphincter    Puborectalis    Diastasis Recti 1 finger width separation above umbilicus - difficulty assessing lower due to extensive scar tissue  (Blank rows = not tested)         TONE: high   PROLAPSE: Unable to see any this treatment session   TODAY'S TREATMENT 09/02/22: Neuromuscular re-education: Transversus abdominus training with multimodal cues for improved motor control and breath coordination Supine march 2 x 10 Supine UE ball press 2 x 10 Pelvic tilts 10x Therapeutic activities: Double voiding Regular orgasm, use of vibrator  Water intake/schedule to help make sure she is getting enough and timing it so she is not taking too much in at the end of the day.    TREATMENT 08/26/22: Manual: Abdominal scar tissue mobilization Diaphragm release Abdominal soft tissue mobilization Exercises: Open books 10x bil Cat cow 10x Kneeling hip flexor stretch 60 sec bil Bent knee fall out 5x bil Therapeutic activities: Regular orgasm (at least 1x/week) Lubricant discussion - ordered intimate rose Foreplay activities to help with desensitization                                                                                                                               DATE: 07/20/22  EVAL  Neuromuscular re-education: Pelvic floor contraction training with breath coordination Practice the knack with  internal feedback Therapeutic activities: The Recruitment consultant       PATIENT EDUCATION:  Education details: see above Person educated: Patient Education method: Explanation, Demonstration, Tactile cues, Verbal cues, and Handouts Education comprehension: verbalized understanding   HOME EXERCISE PROGRAM: [id number]   ASSESSMENT:   CLINICAL IMPRESSION: Pt doing well and was able to have successful intercourse, but still caused pain with initial penetration. We discussed vibrator use during dilator program and intercourse to help improve comfort. Due to not completely emptying bladder, discussed double voiding, relaxed toilet mechanics, and water intake. Working on better hydration throughout the day and cutting off fluids several hours before bed will help decrease nocturia. Core training and progressions performed to help further mobilize abdominal scar tissue - good tolerance  to all exercises today. Pt will continue to benefit from skilled PT intervention in order to decrease stress urinary incontinence, improve dyspareunia, and address impairments.    OBJECTIVE IMPAIRMENTS: decreased activity tolerance, decreased coordination, decreased endurance, decreased strength, hypomobility, increased fascial restrictions, increased muscle spasms, impaired tone, postural dysfunction, and pain.    ACTIVITY LIMITATIONS: continence   PARTICIPATION LIMITATIONS: interpersonal relationship   PERSONAL FACTORS: 3+ comorbidities: Abdominal hysterectomy/splenectomy/peritonectomy (Rt upper quadrant)/cholecystectomy with antineoplastic agent administered 02/19/20, ileostomy for small bowel obstruction and lysis of adhesions 09/2020, c-section x3  are also affecting patient's functional outcome.    REHAB POTENTIAL: Good   CLINICAL DECISION MAKING: Stable/uncomplicated   EVALUATION COMPLEXITY: Low     GOALS: Goals reviewed with patient? Yes   SHORT TERM GOALS: Target date:  08/27/22 - updated 09/02/22   Pt will be independent with HEP.    Baseline: Goal status: MET 09/02/22   2.  Pt will be independent with the knack, urge suppression technique, and double voiding in order to improve bladder habits and decrease urinary incontinence.    Baseline:  Goal status: IN PROGRESS   3.  Pt will be independent with diaphragmatic breathing and down training activities in order to improve pelvic floor relaxation.   Baseline: Goal status: IN PROGRESS   LONG TERM GOALS: Target date: 10/12/22 - updated 09/02/22   Pt will be independent with HEP.    Baseline:  Goal status: IN PROGRESS   2.  Pt will demonstrate normal pelvic floor muscle tone and A/ROM, able to achieve 4/5 strength with contractions and 10 sec endurance, in order to provide appropriate lumbopelvic support in functional activities.    Baseline:  Goal status: IN PROGRESS   3.  Pt will report no leaks with laughing, coughing, sneezing in order to improve comfort with interpersonal relationships and community activities.    Baseline:  Goal status: IN PROGRESS   4.  Pt will report 0/10 pain with vaginal penetration in order to improve intimate relationship with partner.     Baseline:  Goal status: IN PROGRESS       PLAN:   PT FREQUENCY: 1-2x/week   PT DURATION: 12 weeks   PLANNED INTERVENTIONS: Therapeutic exercises, Therapeutic activity, Neuromuscular re-education, Balance training, Gait training, Patient/Family education, Self Care, Joint mobilization, Dry Needling, Biofeedback, and Manual therapy   PLAN FOR NEXT SESSION: scar tissue mobilization in abdomen with focus over bladder; progress core strengthening  Heather Roberts, PT, DPT02/21/243:22 PM

## 2022-09-06 DIAGNOSIS — N390 Urinary tract infection, site not specified: Secondary | ICD-10-CM | POA: Diagnosis not present

## 2022-09-06 DIAGNOSIS — S161XXA Strain of muscle, fascia and tendon at neck level, initial encounter: Secondary | ICD-10-CM | POA: Diagnosis not present

## 2022-09-09 ENCOUNTER — Ambulatory Visit: Payer: PPO

## 2022-09-09 DIAGNOSIS — M62838 Other muscle spasm: Secondary | ICD-10-CM

## 2022-09-09 DIAGNOSIS — N393 Stress incontinence (female) (male): Secondary | ICD-10-CM

## 2022-09-09 DIAGNOSIS — M6281 Muscle weakness (generalized): Secondary | ICD-10-CM

## 2022-09-09 DIAGNOSIS — R279 Unspecified lack of coordination: Secondary | ICD-10-CM

## 2022-09-09 DIAGNOSIS — N941 Unspecified dyspareunia: Secondary | ICD-10-CM

## 2022-09-09 NOTE — Therapy (Signed)
OUTPATIENT PHYSICAL THERAPY TREATMENT NOTE   Patient Name: Kristy Sherman MRN: [id number] DOB:May 03, 1951, 72 y.o., female Today's Date: 09/09/2022  PCP: Leeroy Cha, MD REFERRING PROVIDER: Waymon Amato, MD  END OF SESSION:   PT End of Session - 09/09/22 1534     Visit Number 4    Date for PT Re-Evaluation 10/12/22    Authorization Type [employer]    PT Start Time 1530    PT Stop Time 1610    PT Time Calculation (min) 40 min    Activity Tolerance Patient tolerated treatment well    Behavior During Therapy [facility] for tasks assessed/performed              Past Medical History:  Diagnosis Date   ANKLE PAIN, LEFT 10/17/2007   Qualifier: Diagnosis of  By: Jerold Coombe     Hyperlipidemia    Hypertension    Obesity    Sjoegren syndrome    Past Surgical History:  Procedure Laterality Date   ABDOMINAL HYSTERECTOMY     with HIPEC   ABDOMINAL [place], 1982, 1986   CHOLECYSTECTOMY     LAPAROSCOPIC APPENDECTOMY N/A 11/11/2018   Procedure: APPENDECTOMY LAPAROSCOPIC;  Surgeon: Michael Boston, MD;  Location: [facility];  Service: General;  Laterality: N/A;   SPLENECTOMY, TOTAL     Patient Active Problem List   Diagnosis Date Noted   Elevated immunoglobulin A 05/21/2021   Abnormality of plasma protein 05/05/2021   Abnormal serum protein test 04/22/2021   Hypercalcemia 04/15/2021   Dyspareunia in female 11/11/2018   Atrophic vaginitis 11/11/2018   Acute perforated appendicitis 11/11/2018   Acute appendicitis with perforation and localized peritonitis 11/11/2018   Sjogren's syndrome ([place])    Obesity    Diverticulosis of colon  11/10/2012   Hyperlipidemia 02/24/2007   Essential hypertension 02/24/2007    REFERRING DIAG: N89.5 (ICD-10-CM) - Stricture and atresia of vagina  THERAPY DIAG:  Muscle weakness (generalized)  Other muscle spasm  Unspecified lack of coordination  Stress incontinence (female) (female)  Dyspareunia,  female  Rationale for Evaluation and Treatment Rehabilitation  PERTINENT HISTORY: Abdominal hysterectomy/splenectomy/peritonectomy (Rt upper quadrant)/cholecystectomy with antineoplastic agent administered 02/19/20, ileostomy for small bowel obstruction and lysis of adhesions 09/2020, c-section x3  PRECAUTIONS: hx cancer  SUBJECTIVE:                                                                                                                                                                                      SUBJECTIVE STATEMENT:  Pt states that she had a UTI over the weekend. She is on antibiotics now and is feeling much better.  PAIN:  Are you having pain? No                  07/20/22 SUBJECTIVE STATEMENT: Pt states that she is here due to painful intercourse and controlling her bladder (3-4 years). She started estradiol cream, but stopped taking it until just recently.  Fluid intake: Yes: a lot of water     PAIN:  Are you having pain? Yes NPRS scale: 10/10 Pain location: Vaginal   Pain type: burning, sharp, and tight Pain description: intermittent    Aggravating factors: intercourse Relieving factors: no vaginal penetration   PRECAUTIONS: None   WEIGHT BEARING RESTRICTIONS: No   FALLS:  Has patient fallen in last 6 months? No   LIVING ENVIRONMENT: Lives with: lives with their family Lives in: House/apartment   OCCUPATION: retired   PLOF: Independent   PATIENT GOALS: to be able to have intimacy if possible; not to wear pad for urinary incontinence   PERTINENT HISTORY:  Abdominal hysterectomy/splenectomy/peritonectomy (Rt upper quadrant)/cholecystectomy with antineoplastic agent administered 02/19/20, ileostomy for small bowel obstruction and lysis of adhesions 09/2020, c-section x3 Sexual abuse: No   BOWEL MOVEMENT: Pain with bowel movement: No Type of bowel movement:Frequency daily and Strain No Fully empty rectum: Yes: sometimes takes more than one trip to the  bathroom Leakage: No Pads: No Fiber supplement: No *used to have constipation issues - just had ileostomy reversal August 3rd   URINATION: Pain with urination: No Fully empty bladder: Yes: she will leak after urinating though/post void dribbling Stream: Strong Urgency: No Frequency: 3-5x/day Leakage: Coughing, Sneezing, Laughing, and for no reason Pads: Yes: 1 during the day and 1 at night   INTERCOURSE: Pain with intercourse: Initial Penetration and During Penetration Ability to have vaginal penetration:  Yes: very painful Climax: yes Marinoff Scale: 3/3 *using uberlube   PREGNANCY: Vaginal deliveries 0 Tearing No C-section deliveries 3 Currently pregnant No   PROLAPSE: None     OBJECTIVE:  07/20/22:   COGNITION: Overall cognitive status: Within functional limits for tasks assessed                          SENSATION: Light touch: Appears intact Proprioception: Appears intact   MUSCLE LENGTH:     FUNCTIONAL TESTS:      GAIT: Comments: Lt trendelenburg   POSTURE: rounded shoulders, forward head, decreased lumbar lordosis, increased thoracic kyphosis, and posterior pelvic tilt   PELVIC ALIGNMENT:   LUMBARAROM/PROM:   A/PROM A/PROM  eval  Flexion    Extension    Right lateral flexion    Left lateral flexion    Right rotation    Left rotation     (Blank rows = not tested)   LOWER EXTREMITY ROM:     LOWER EXTREMITY MMT:   MMT Right eval Left eval  Hip flexion      Hip extension      Hip abduction      Hip adduction      Hip internal rotation      Hip external rotation      Knee flexion      Knee extension      Ankle dorsiflexion      Ankle plantarflexion      Ankle inversion      Ankle eversion        PALPATION:   General  Significant abdominal scar tissue and restriction, especially over bladder  External Perineal Exam WNL                             Internal Pelvic Floor restriction and burning throughout  superficial pelvic floor; restriction/high tone in all deep layers, but no discomfort reported; flexed coccyx   Patient confirms identification and approves PT to assess internal pelvic floor and treatment Yes   PELVIC MMT:   MMT eval  Vaginal 3/5, 3 second endurance, 5 repeat contraction with poor coordination and difficulty with full relaxation  Internal Anal Sphincter    External Anal Sphincter    Puborectalis    Diastasis Recti 1 finger width separation above umbilicus - difficulty assessing lower due to extensive scar tissue  (Blank rows = not tested)         TONE: high   PROLAPSE: Unable to see any this treatment session   TODAY'S TREATMENT 09/09/22 Manual: Abdominal scar tissue mobilization Diaphragm release Abdominal soft tissue mobilization Neuromuscular re-education: Supine UE ball press Sidelying UE ball press  Exercises: Wide foot lower trunk rotation 2 x 10 Bridge with hip adduction 2 x 10    TREATMENT 09/02/22: Neuromuscular re-education: Transversus abdominus training with multimodal cues for improved motor control and breath coordination Supine march 2 x 10 Supine UE ball press 2 x 10 Pelvic tilts 10x Therapeutic activities: Double voiding Regular orgasm, use of vibrator  Water intake/schedule to help make sure she is getting enough and timing it so she is not taking too much in at the end of the day.    TREATMENT 08/26/22: Manual: Abdominal scar tissue mobilization Diaphragm release Abdominal soft tissue mobilization Exercises: Open books 10x bil Cat cow 10x Kneeling hip flexor stretch 60 sec bil Bent knee fall out 5x bil Therapeutic activities: Regular orgasm (at least 1x/week) Lubricant discussion - ordered intimate rose Foreplay activities to help with desensitization           PATIENT EDUCATION:  Education details: see above Person educated: Patient Education method: Consulting civil engineer, Demonstration, Tactile cues, Verbal cues, and  Handouts Education comprehension: verbalized understanding   HOME EXERCISE PROGRAM: [id number]   ASSESSMENT:   CLINICAL IMPRESSION: Pt condition largely unchanged from last week due to not being able to perform much of HEP with UTI and sickness over the last week. She tolerated manual techniques to abdominal scar tissue well; believe there is improvement in scar tissue mobility, but significant restriction still present. She did well with review of several exercises and addition of core progressions. Believe core strengthening will help improve circulation and movement in core. Pt will continue to benefit from skilled PT intervention in order to decrease stress urinary incontinence, improve dyspareunia, and address impairments.    OBJECTIVE IMPAIRMENTS: decreased activity tolerance, decreased coordination, decreased endurance, decreased strength, hypomobility, increased fascial restrictions, increased muscle spasms, impaired tone, postural dysfunction, and pain.    ACTIVITY LIMITATIONS: continence   PARTICIPATION LIMITATIONS: interpersonal relationship   PERSONAL FACTORS: 3+ comorbidities: Abdominal hysterectomy/splenectomy/peritonectomy (Rt upper quadrant)/cholecystectomy with antineoplastic agent administered 02/19/20, ileostomy for small bowel obstruction and lysis of adhesions 09/2020, c-section x3  are also affecting patient's functional outcome.    REHAB POTENTIAL: Good   CLINICAL DECISION MAKING: Stable/uncomplicated   EVALUATION COMPLEXITY: Low     GOALS: Goals reviewed with patient? Yes   SHORT TERM GOALS: Target date: 08/27/22 - updated 09/02/22   Pt will be independent with HEP.    Baseline: Goal status: MET 09/02/22   2.  Pt  will be independent with the knack, urge suppression technique, and double voiding in order to improve bladder habits and decrease urinary incontinence.    Baseline:  Goal status: IN PROGRESS   3.  Pt will be independent with diaphragmatic breathing  and down training activities in order to improve pelvic floor relaxation.   Baseline: Goal status: IN PROGRESS   LONG TERM GOALS: Target date: 10/12/22 - updated 09/02/22   Pt will be independent with HEP.    Baseline:  Goal status: IN PROGRESS   2.  Pt will demonstrate normal pelvic floor muscle tone and A/ROM, able to achieve 4/5 strength with contractions and 10 sec endurance, in order to provide appropriate lumbopelvic support in functional activities.    Baseline:  Goal status: IN PROGRESS   3.  Pt will report no leaks with laughing, coughing, sneezing in order to improve comfort with interpersonal relationships and community activities.    Baseline:  Goal status: IN PROGRESS   4.  Pt will report 0/10 pain with vaginal penetration in order to improve intimate relationship with partner.     Baseline:  Goal status: IN PROGRESS       PLAN:   PT FREQUENCY: 1-2x/week   PT DURATION: 12 weeks   PLANNED INTERVENTIONS: Therapeutic exercises, Therapeutic activity, Neuromuscular re-education, Balance training, Gait training, Patient/Family education, Self Care, Joint mobilization, Dry Needling, Biofeedback, and Manual therapy   PLAN FOR NEXT SESSION: scar tissue mobilization in abdomen with focus over bladder; progress core strengthening  Heather Roberts, PT, DPT02/28/244:12 PM

## 2022-09-16 ENCOUNTER — Ambulatory Visit: Payer: PPO | Attending: Obstetrics & Gynecology

## 2022-09-16 DIAGNOSIS — N941 Unspecified dyspareunia: Secondary | ICD-10-CM | POA: Insufficient documentation

## 2022-09-16 DIAGNOSIS — M6281 Muscle weakness (generalized): Secondary | ICD-10-CM | POA: Diagnosis not present

## 2022-09-16 DIAGNOSIS — D373 Neoplasm of uncertain behavior of appendix: Secondary | ICD-10-CM | POA: Diagnosis not present

## 2022-09-16 DIAGNOSIS — M62838 Other muscle spasm: Secondary | ICD-10-CM | POA: Diagnosis not present

## 2022-09-16 DIAGNOSIS — R279 Unspecified lack of coordination: Secondary | ICD-10-CM | POA: Insufficient documentation

## 2022-09-16 DIAGNOSIS — Z90710 Acquired absence of both cervix and uterus: Secondary | ICD-10-CM | POA: Diagnosis not present

## 2022-09-16 DIAGNOSIS — N393 Stress incontinence (female) (male): Secondary | ICD-10-CM | POA: Insufficient documentation

## 2022-09-16 DIAGNOSIS — Z79899 Other long term (current) drug therapy: Secondary | ICD-10-CM | POA: Diagnosis not present

## 2022-09-16 DIAGNOSIS — R293 Abnormal posture: Secondary | ICD-10-CM | POA: Diagnosis not present

## 2022-09-16 NOTE — Therapy (Signed)
OUTPATIENT PHYSICAL THERAPY TREATMENT NOTE   Patient Name: Kristy Sherman MRN: [id number] DOB:Mar 12, 1951, 72 y.o., female Today's Date: 09/16/2022  PCP: Leeroy Cha, MD REFERRING PROVIDER: Waymon Amato, MD  END OF SESSION:   PT End of Session - 09/16/22 1448     Visit Number 5    Date for PT Re-Evaluation 10/12/22    Authorization Type [employer]    PT Start Time 1448    PT Stop Time 1526    PT Time Calculation (min) 38 min    Activity Tolerance Patient tolerated treatment well    Behavior During Therapy [facility] for tasks assessed/performed              Past Medical History:  Diagnosis Date   ANKLE PAIN, LEFT 10/17/2007   Qualifier: Diagnosis of  By: Jerold Coombe     Hyperlipidemia    Hypertension    Obesity    Sjoegren syndrome    Past Surgical History:  Procedure Laterality Date   ABDOMINAL HYSTERECTOMY     with HIPEC   ABDOMINAL [place], 1982, [place] N/A 11/11/2018   Procedure: APPENDECTOMY LAPAROSCOPIC;  Surgeon: Michael Boston, MD;  Location: [facility];  Service: General;  Laterality: N/A;   SPLENECTOMY, TOTAL     Patient Active Problem List   Diagnosis Date Noted   Elevated immunoglobulin A 05/21/2021   Abnormality of plasma protein 05/05/2021   Abnormal serum protein test 04/22/2021   Hypercalcemia 04/15/2021   Dyspareunia in female 11/11/2018   Atrophic vaginitis 11/11/2018   Acute perforated appendicitis 11/11/2018   Acute appendicitis with perforation and localized peritonitis 11/11/2018   Sjogren's syndrome ([place])    Obesity    Diverticulosis of colon  11/10/2012   Hyperlipidemia 02/24/2007   Essential hypertension 02/24/2007    REFERRING DIAG: N89.5 (ICD-10-CM) - Stricture and atresia of vagina  THERAPY DIAG:  Muscle weakness (generalized)  Other muscle spasm  Unspecified lack of coordination  Stress incontinence (female) (female)  Dyspareunia,  female  Rationale for Evaluation and Treatment Rehabilitation  PERTINENT HISTORY: Abdominal hysterectomy/splenectomy/peritonectomy (Rt upper quadrant)/cholecystectomy with antineoplastic agent administered 02/19/20, ileostomy for small bowel obstruction and lysis of adhesions 09/2020, c-section x3  PRECAUTIONS: hx cancer  SUBJECTIVE:                                                                                                                                                                                      SUBJECTIVE STATEMENT:  Pt states that she had CT scan to make sure she is still cancer free. She said that CT was  clear, but demonstrated significant constipation.   PAIN:  Are you having pain? No                  07/20/22 SUBJECTIVE STATEMENT: Pt states that she is here due to painful intercourse and controlling her bladder (3-4 years). She started estradiol cream, but stopped taking it until just recently.  Fluid intake: Yes: a lot of water     PAIN:  Are you having pain? Yes NPRS scale: 10/10 Pain location: Vaginal   Pain type: burning, sharp, and tight Pain description: intermittent    Aggravating factors: intercourse Relieving factors: no vaginal penetration   PRECAUTIONS: None   WEIGHT BEARING RESTRICTIONS: No   FALLS:  Has patient fallen in last 6 months? No   LIVING ENVIRONMENT: Lives with: lives with their family Lives in: House/apartment   OCCUPATION: retired   PLOF: Independent   PATIENT GOALS: to be able to have intimacy if possible; not to wear pad for urinary incontinence   PERTINENT HISTORY:  Abdominal hysterectomy/splenectomy/peritonectomy (Rt upper quadrant)/cholecystectomy with antineoplastic agent administered 02/19/20, ileostomy for small bowel obstruction and lysis of adhesions 09/2020, c-section x3 Sexual abuse: No   BOWEL MOVEMENT: Pain with bowel movement: No Type of bowel movement:Frequency daily and Strain No Fully empty rectum: Yes:  sometimes takes more than one trip to the bathroom Leakage: No Pads: No Fiber supplement: No *used to have constipation issues - just had ileostomy reversal August 3rd   URINATION: Pain with urination: No Fully empty bladder: Yes: she will leak after urinating though/post void dribbling Stream: Strong Urgency: No Frequency: 3-5x/day Leakage: Coughing, Sneezing, Laughing, and for no reason Pads: Yes: 1 during the day and 1 at night   INTERCOURSE: Pain with intercourse: Initial Penetration and During Penetration Ability to have vaginal penetration:  Yes: very painful Climax: yes Marinoff Scale: 3/3 *using uberlube   PREGNANCY: Vaginal deliveries 0 Tearing No C-section deliveries 3 Currently pregnant No   PROLAPSE: None     OBJECTIVE:  07/20/22:   COGNITION: Overall cognitive status: Within functional limits for tasks assessed                          SENSATION: Light touch: Appears intact Proprioception: Appears intact   MUSCLE LENGTH:     FUNCTIONAL TESTS:      GAIT: Comments: Lt trendelenburg   POSTURE: rounded shoulders, forward head, decreased lumbar lordosis, increased thoracic kyphosis, and posterior pelvic tilt   PELVIC ALIGNMENT:   LUMBARAROM/PROM:   A/PROM A/PROM  eval  Flexion    Extension    Right lateral flexion    Left lateral flexion    Right rotation    Left rotation     (Blank rows = not tested)   LOWER EXTREMITY ROM:     LOWER EXTREMITY MMT:   MMT Right eval Left eval  Hip flexion      Hip extension      Hip abduction      Hip adduction      Hip internal rotation      Hip external rotation      Knee flexion      Knee extension      Ankle dorsiflexion      Ankle plantarflexion      Ankle inversion      Ankle eversion        PALPATION:   General  Significant abdominal scar tissue and restriction, especially over bladder  External Perineal Exam WNL                             Internal Pelvic Floor  restriction and burning throughout superficial pelvic floor; restriction/high tone in all deep layers, but no discomfort reported; flexed coccyx   Patient confirms identification and approves PT to assess internal pelvic floor and treatment Yes   PELVIC MMT:   MMT eval  Vaginal 3/5, 3 second endurance, 5 repeat contraction with poor coordination and difficulty with full relaxation  Internal Anal Sphincter    External Anal Sphincter    Puborectalis    Diastasis Recti 1 finger width separation above umbilicus - difficulty assessing lower due to extensive scar tissue  (Blank rows = not tested)         TONE: high   PROLAPSE: Unable to see any this treatment session   TODAY'S TREATMENT 09/16/22 Neuromuscular re-education: UE ball press 5x Supine weight drop 2 x 10 5lb weight Exercises: Wide foot knee rocking Supine piriformis stretch 60 sec bil Butterfly stretch 2 min Clam shells 2 x 10 bil Reverse clam shells 2 x 10 bil Therapeutic activities: Squatty potty/relaxed toilet mechanics - practice  Balloon breathing Self-bowel massage   TREATMENT 09/09/22 Manual: Abdominal scar tissue mobilization Diaphragm release Abdominal soft tissue mobilization Neuromuscular re-education: Supine UE ball press Sidelying UE ball press  Exercises: Wide foot lower trunk rotation 2 x 10 Bridge with hip adduction 2 x 10    TREATMENT 09/02/22: Neuromuscular re-education: Transversus abdominus training with multimodal cues for improved motor control and breath coordination Supine march 2 x 10 Supine UE ball press 2 x 10 Pelvic tilts 10x Therapeutic activities: Double voiding Regular orgasm, use of vibrator  Water intake/schedule to help make sure she is getting enough and timing it so she is not taking too much in at the end of the day.    PATIENT EDUCATION:  Education details: see above Person educated: Patient Education method: Explanation, Demonstration, Tactile cues, Verbal cues,  and Handouts Education comprehension: verbalized understanding   HOME EXERCISE PROGRAM: [id number]   ASSESSMENT:   CLINICAL IMPRESSION: Pt having more difficulty with constipation over the last couple of weeks. Due to this, we went over techniques to help improve constipation; she practiced appropriate toilet position with balloon breathing and we worked on more complete relaxation in this position. She was encouraged that mobility exercise swill also help with constipation. Good tolerance to all exercise progressions. Pt will continue to benefit from skilled PT intervention in order to decrease stress urinary incontinence, improve dyspareunia, and address impairments.    OBJECTIVE IMPAIRMENTS: decreased activity tolerance, decreased coordination, decreased endurance, decreased strength, hypomobility, increased fascial restrictions, increased muscle spasms, impaired tone, postural dysfunction, and pain.    ACTIVITY LIMITATIONS: continence   PARTICIPATION LIMITATIONS: interpersonal relationship   PERSONAL FACTORS: 3+ comorbidities: Abdominal hysterectomy/splenectomy/peritonectomy (Rt upper quadrant)/cholecystectomy with antineoplastic agent administered 02/19/20, ileostomy for small bowel obstruction and lysis of adhesions 09/2020, c-section x3  are also affecting patient's functional outcome.    REHAB POTENTIAL: Good   CLINICAL DECISION MAKING: Stable/uncomplicated   EVALUATION COMPLEXITY: Low     GOALS: Goals reviewed with patient? Yes   SHORT TERM GOALS: Target date: 08/27/22 - updated 09/02/22   Pt will be independent with HEP.    Baseline: Goal status: MET 09/02/22   2.  Pt will be independent with the knack, urge suppression technique, and double voiding in order to improve bladder  habits and decrease urinary incontinence.    Baseline:  Goal status: IN PROGRESS   3.  Pt will be independent with diaphragmatic breathing and down training activities in order to improve pelvic floor  relaxation.   Baseline: Goal status: IN PROGRESS   LONG TERM GOALS: Target date: 10/12/22 - updated 09/02/22   Pt will be independent with HEP.    Baseline:  Goal status: IN PROGRESS   2.  Pt will demonstrate normal pelvic floor muscle tone and A/ROM, able to achieve 4/5 strength with contractions and 10 sec endurance, in order to provide appropriate lumbopelvic support in functional activities.    Baseline:  Goal status: IN PROGRESS   3.  Pt will report no leaks with laughing, coughing, sneezing in order to improve comfort with interpersonal relationships and community activities.    Baseline:  Goal status: IN PROGRESS   4.  Pt will report 0/10 pain with vaginal penetration in order to improve intimate relationship with partner.     Baseline:  Goal status: IN PROGRESS       PLAN:   PT FREQUENCY: 1-2x/week   PT DURATION: 12 weeks   PLANNED INTERVENTIONS: Therapeutic exercises, Therapeutic activity, Neuromuscular re-education, Balance training, Gait training, Patient/Family education, Self Care, Joint mobilization, Dry Needling, Biofeedback, and Manual therapy   PLAN FOR NEXT SESSION: scar tissue mobilization in abdomen with focus over bladder; progress core strengthening  Heather Roberts, PT, DPT03/06/243:29 PM

## 2022-09-16 NOTE — Patient Instructions (Signed)
Squatty potty: When your knees are level or below the level of your hips, pelvic floor muscles are pressed against rectum, preventing ease of bowel movement. By getting knees above the level of the hips, these pelvic floor muscles relax, allowing easier passage of bowel movement. Ways to get knees above hips: o Squatty Potty (7inch and 9inch versions) o Small stool o Roll of toilet paper under each foot o Hardback book or stack of magazines under each foot  Relaxed Toileting mechanics: Once in this position, make sure to lean forward with forearms on thighs, wide knees, relaxed stomach, and breathe.   **Balloon Breathing   Bowel massage: To assist with more regular and more comfortable bowel movements, try performing bowel massage nightly for 5-10 minutes. Place hands in the lower right side of your abdomen to start; in small circles, massage up, across, and down the left side of your abdomen. Pressure does not need to be hard, but just comfortable. You can use lotion or oil to make more comfortable.   [facility] [address] [place], [place] [address] Phone # [phone] Fax [phone]

## 2022-09-23 ENCOUNTER — Ambulatory Visit: Payer: PPO

## 2022-09-23 DIAGNOSIS — N941 Unspecified dyspareunia: Secondary | ICD-10-CM

## 2022-09-23 DIAGNOSIS — R279 Unspecified lack of coordination: Secondary | ICD-10-CM

## 2022-09-23 DIAGNOSIS — R293 Abnormal posture: Secondary | ICD-10-CM

## 2022-09-23 DIAGNOSIS — M6281 Muscle weakness (generalized): Secondary | ICD-10-CM | POA: Diagnosis not present

## 2022-09-23 DIAGNOSIS — N393 Stress incontinence (female) (male): Secondary | ICD-10-CM

## 2022-09-23 DIAGNOSIS — M62838 Other muscle spasm: Secondary | ICD-10-CM

## 2022-09-23 NOTE — Patient Instructions (Signed)
Urge Incontinence  Ideal urination frequency is every 2-4 wakeful hours, which equates to 5-8 times within a 24-hour period.   Urge incontinence is leakage that occurs when the bladder muscle contracts, creating a sudden need to go before getting to the bathroom.   Going too often when your bladder isn't actually full can disrupt the body's automatic signals to store and hold urine longer, which will increase urgency/frequency.  In this case, the bladder "is running the show" and strategies can be learned to retrain this pattern.   One should be able to control the first urge to urinate, at around 150mL.  The bladder can hold up to a "grande latte," or 400mL. To help you gain control, practice the Urge Drill below when urgency strikes.  This drill will help retrain your bladder signals and allow you to store and hold urine longer.  The overall goal is to stretch out your time between voids to reach a more manageable voiding schedule.    Practice your "quick flicks" often throughout the day (each waking hour) even when you don't need feel the urge to go.  This will help strengthen your pelvic floor muscles, making them more effective in controlling leakage.  Urge Drill  When you feel an urge to go, follow these steps to regain control: Stop what you are doing and be still Take one deep breath, directing your air into your abdomen Think an affirming thought, such as "I've got this." Do 5 quick flicks of your pelvic floor Walk with control to the bathroom to void, or delay voiding  [facility] [address], [address] [place], [place] [address] Phone # [phone] Fax [phone]  

## 2022-09-23 NOTE — Therapy (Signed)
OUTPATIENT PHYSICAL THERAPY TREATMENT NOTE   Patient Name: Kristy Sherman MRN: [id number] DOB:[birth year], 72 y.o., female Today's Date: 09/24/2022  PCP: Leeroy Cha, MD REFERRING PROVIDER: Waymon Amato, MD  END OF SESSION:   PT End of Session - 09/23/22 1445     Visit Number 6    Date for PT Re-Evaluation 10/12/22    Authorization Type [employer]    PT Start Time 1445    PT Stop Time 1525    PT Time Calculation (min) 40 min    Activity Tolerance Patient tolerated treatment well    Behavior During Therapy [facility] for tasks assessed/performed              Past Medical History:  Diagnosis Date   ANKLE PAIN, LEFT 10/17/2007   Qualifier: Diagnosis of  By: Jerold Coombe     Hyperlipidemia    Hypertension    Obesity    Sjoegren syndrome    Past Surgical History:  Procedure Laterality Date   ABDOMINAL HYSTERECTOMY     with HIPEC   ABDOMINAL [place], 1982, 1986   CHOLECYSTECTOMY     LAPAROSCOPIC APPENDECTOMY N/A 11/11/2018   Procedure: APPENDECTOMY LAPAROSCOPIC;  Surgeon: Michael Boston, MD;  Location: [facility];  Service: General;  Laterality: N/A;   SPLENECTOMY, TOTAL     Patient Active Problem List   Diagnosis Date Noted   Elevated immunoglobulin A 05/21/2021   Abnormality of plasma protein 05/05/2021   Abnormal serum protein test 04/22/2021   Hypercalcemia 04/15/2021   Dyspareunia in female 11/11/2018   Atrophic vaginitis 11/11/2018   Acute perforated appendicitis 11/11/2018   Acute appendicitis with perforation and localized peritonitis 11/11/2018   Sjogren's syndrome ([place])    Obesity    Diverticulosis of colon  11/10/2012   Hyperlipidemia 02/24/2007   Essential hypertension 02/24/2007    REFERRING DIAG: N89.5 (ICD-10-CM) - Stricture and atresia of vagina  THERAPY DIAG:  Muscle weakness (generalized)  Other muscle spasm  Unspecified lack of coordination  Stress incontinence (female) (female)  Dyspareunia,  female  Abnormal posture  Rationale for Evaluation and Treatment Rehabilitation  PERTINENT HISTORY: Abdominal hysterectomy/splenectomy/peritonectomy (Rt upper quadrant)/cholecystectomy with antineoplastic agent administered 02/19/20, ileostomy for small bowel obstruction and lysis of adhesions 09/2020, c-section x3  PRECAUTIONS: hx cancer  SUBJECTIVE:                                                                                                                                                                                      SUBJECTIVE STATEMENT:  Pt states that she is feeling better this week. She states that she was able to have  intercourse and rates pain at 4-5/10 compared to 10/10. She is having more difficulty with urinary leaking, especially right after being in the pool.    PAIN:  Are you having pain? No                  07/20/22 SUBJECTIVE STATEMENT: Pt states that she is here due to painful intercourse and controlling her bladder (3-4 years). She started estradiol cream, but stopped taking it until just recently.  Fluid intake: Yes: a lot of water     PAIN:  Are you having pain? Yes NPRS scale: 4-5/10 Pain location: Vaginal   Pain type: burning, sharp, and tight Pain description: intermittent    Aggravating factors: intercourse Relieving factors: no vaginal penetration   PRECAUTIONS: None   WEIGHT BEARING RESTRICTIONS: No   FALLS:  Has patient fallen in last 6 months? No   LIVING ENVIRONMENT: Lives with: lives with their family Lives in: House/apartment   OCCUPATION: retired   PLOF: Independent   PATIENT GOALS: to be able to have intimacy if possible; not to wear pad for urinary incontinence   PERTINENT HISTORY:  Abdominal hysterectomy/splenectomy/peritonectomy (Rt upper quadrant)/cholecystectomy with antineoplastic agent administered 02/19/20, ileostomy for small bowel obstruction and lysis of adhesions 09/2020, c-section x3 Sexual abuse: No   BOWEL  MOVEMENT: Pain with bowel movement: No Type of bowel movement:Frequency daily and Strain No Fully empty rectum: Yes: sometimes takes more than one trip to the bathroom Leakage: No Pads: No Fiber supplement: No *used to have constipation issues - just had ileostomy reversal August 3rd   URINATION: Pain with urination: No Fully empty bladder: Yes: she will leak after urinating though/post void dribbling Stream: Strong Urgency: No Frequency: 3-5x/day Leakage: Coughing, Sneezing, Laughing, and for no reason Pads: Yes: 1 during the day and 1 at night   INTERCOURSE: Pain with intercourse: Initial Penetration and During Penetration Ability to have vaginal penetration:  Yes: very painful Climax: yes Marinoff Scale: 3/3 *using uberlube   PREGNANCY: Vaginal deliveries 0 Tearing No C-section deliveries 3 Currently pregnant No   PROLAPSE: None     OBJECTIVE:  07/20/22:   COGNITION: Overall cognitive status: Within functional limits for tasks assessed                          SENSATION: Light touch: Appears intact Proprioception: Appears intact   MUSCLE LENGTH:     FUNCTIONAL TESTS:      GAIT: Comments: Lt trendelenburg   POSTURE: rounded shoulders, forward head, decreased lumbar lordosis, increased thoracic kyphosis, and posterior pelvic tilt   PELVIC ALIGNMENT:   LUMBARAROM/PROM:   A/PROM A/PROM  eval  Flexion    Extension    Right lateral flexion    Left lateral flexion    Right rotation    Left rotation     (Blank rows = not tested)   LOWER EXTREMITY ROM:     LOWER EXTREMITY MMT:   MMT Right eval Left eval  Hip flexion      Hip extension      Hip abduction      Hip adduction      Hip internal rotation      Hip external rotation      Knee flexion      Knee extension      Ankle dorsiflexion      Ankle plantarflexion      Ankle inversion      Ankle eversion  PALPATION:   General  Significant abdominal scar tissue and restriction,  especially over bladder                 External Perineal Exam WNL                             Internal Pelvic Floor restriction and burning throughout superficial pelvic floor; restriction/high tone in all deep layers, but no discomfort reported; flexed coccyx   Patient confirms identification and approves PT to assess internal pelvic floor and treatment Yes   PELVIC MMT:   MMT eval  Vaginal 3/5, 3 second endurance, 5 repeat contraction with poor coordination and difficulty with full relaxation  Internal Anal Sphincter    External Anal Sphincter    Puborectalis    Diastasis Recti 1 finger width separation above umbilicus - difficulty assessing lower due to extensive scar tissue  (Blank rows = not tested)         TONE: high   PROLAPSE: Unable to see any this treatment session   TODAY'S TREATMENT 09/23/22 Manual: Scar tissue mobilization in abdomen Exercises: Lower trunk rotation 2 x 10 Wide leg propped lower trunk rotation 2 x 10 Butterfly stretch in seated 2 minutes Pelvic tilts 2 x 10 Seated forward fold Seated lateral fold Therapeutic activities: Dilators/progression   TREATMENT 09/16/22 Neuromuscular re-education: UE ball press 5x Supine weight drop 2 x 10 5lb weight Exercises: Wide foot knee rocking Supine piriformis stretch 60 sec bil Butterfly stretch 2 min Clam shells 2 x 10 bil Reverse clam shells 2 x 10 bil Therapeutic activities: Squatty potty/relaxed toilet mechanics - practice  Balloon breathing Self-bowel massage   TREATMENT 09/09/22 Manual: Abdominal scar tissue mobilization Diaphragm release Abdominal soft tissue mobilization Neuromuscular re-education: Supine UE ball press Sidelying UE ball press  Exercises: Wide foot lower trunk rotation 2 x 10 Bridge with hip adduction 2 x 10   PATIENT EDUCATION:  Education details: see above Person educated: Patient Education method: Explanation, Demonstration, Tactile cues, Verbal cues, and  Handouts Education comprehension: verbalized understanding   HOME EXERCISE PROGRAM: [id number]   ASSESSMENT:   CLINICAL IMPRESSION: Pt overall doing very well with 50% reduction in pain with intercourse. We discussed progressing dilator size and went over which size options would be good to order while looking at dilator set in clinic. She was encouraged to decrease time spent each time she gets dilator out and focus on increasing frequency; also discussed using at the same time as with vibrator to make experience more enjoyable and not something she does not look forward to. She did well with scar tissue mobilization today and was able to progress mobility exercises with good tolerance. Pt will continue to benefit from skilled PT intervention in order to decrease stress urinary incontinence, improve dyspareunia, and address impairments.    OBJECTIVE IMPAIRMENTS: decreased activity tolerance, decreased coordination, decreased endurance, decreased strength, hypomobility, increased fascial restrictions, increased muscle spasms, impaired tone, postural dysfunction, and pain.    ACTIVITY LIMITATIONS: continence   PARTICIPATION LIMITATIONS: interpersonal relationship   PERSONAL FACTORS: 3+ comorbidities: Abdominal hysterectomy/splenectomy/peritonectomy (Rt upper quadrant)/cholecystectomy with antineoplastic agent administered 02/19/20, ileostomy for small bowel obstruction and lysis of adhesions 09/2020, c-section x3  are also affecting patient's functional outcome.    REHAB POTENTIAL: Good   CLINICAL DECISION MAKING: Stable/uncomplicated   EVALUATION COMPLEXITY: Low     GOALS: Goals reviewed with patient? Yes   SHORT TERM GOALS: Target date: 08/27/22 -  updated 09/02/22   Pt will be independent with HEP.    Baseline: Goal status: MET 09/02/22   2.  Pt will be independent with the knack, urge suppression technique, and double voiding in order to improve bladder habits and decrease urinary  incontinence.    Baseline:  Goal status: IN PROGRESS   3.  Pt will be independent with diaphragmatic breathing and down training activities in order to improve pelvic floor relaxation.   Baseline: Goal status: IN PROGRESS   LONG TERM GOALS: Target date: 10/12/22 - updated 09/02/22   Pt will be independent with HEP.    Baseline:  Goal status: IN PROGRESS   2.  Pt will demonstrate normal pelvic floor muscle tone and A/ROM, able to achieve 4/5 strength with contractions and 10 sec endurance, in order to provide appropriate lumbopelvic support in functional activities.    Baseline:  Goal status: IN PROGRESS   3.  Pt will report no leaks with laughing, coughing, sneezing in order to improve comfort with interpersonal relationships and community activities.    Baseline:  Goal status: IN PROGRESS   4.  Pt will report 0/10 pain with vaginal penetration in order to improve intimate relationship with partner.     Baseline:  Goal status: IN PROGRESS       PLAN:   PT FREQUENCY: 1-2x/week   PT DURATION: 12 weeks   PLANNED INTERVENTIONS: Therapeutic exercises, Therapeutic activity, Neuromuscular re-education, Balance training, Gait training, Patient/Family education, Self Care, Joint mobilization, Dry Needling, Biofeedback, and Manual therapy   PLAN FOR NEXT SESSION: scar tissue mobilization in abdomen with focus over bladder; progress core strengthening  Heather Roberts, PT, DPT03/14/2412:38 PM

## 2022-09-25 DIAGNOSIS — N390 Urinary tract infection, site not specified: Secondary | ICD-10-CM | POA: Diagnosis not present

## 2022-09-25 DIAGNOSIS — R300 Dysuria: Secondary | ICD-10-CM | POA: Diagnosis not present

## 2022-09-30 ENCOUNTER — Ambulatory Visit: Payer: PPO

## 2022-09-30 DIAGNOSIS — R293 Abnormal posture: Secondary | ICD-10-CM

## 2022-09-30 DIAGNOSIS — R279 Unspecified lack of coordination: Secondary | ICD-10-CM

## 2022-09-30 DIAGNOSIS — N941 Unspecified dyspareunia: Secondary | ICD-10-CM

## 2022-09-30 DIAGNOSIS — M6281 Muscle weakness (generalized): Secondary | ICD-10-CM

## 2022-09-30 DIAGNOSIS — N393 Stress incontinence (female) (male): Secondary | ICD-10-CM

## 2022-09-30 DIAGNOSIS — M62838 Other muscle spasm: Secondary | ICD-10-CM

## 2022-09-30 NOTE — Therapy (Signed)
OUTPATIENT PHYSICAL THERAPY TREATMENT NOTE   Patient Name: Kristy Sherman MRN: [id number] DOB:13-Feb-1951, 72 y.o., female Today's Date: 09/30/2022  PCP: Leeroy Cha, MD REFERRING PROVIDER: Waymon Amato, MD  END OF SESSION:   PT End of Session - 09/30/22 1443     Visit Number 7    Date for PT Re-Evaluation 10/12/22    Authorization Type [employer]    PT Start Time 1445    PT Stop Time 1525    PT Time Calculation (min) 40 min    Activity Tolerance Patient tolerated treatment well    Behavior During Therapy [facility] for tasks assessed/performed               Past Medical History:  Diagnosis Date   ANKLE PAIN, LEFT 10/17/2007   Qualifier: Diagnosis of  By: Jerold Coombe     Hyperlipidemia    Hypertension    Obesity    Sjoegren syndrome    Past Surgical History:  Procedure Laterality Date   ABDOMINAL HYSTERECTOMY     with [place], 1982, [place] N/A 11/11/2018   Procedure: APPENDECTOMY LAPAROSCOPIC;  Surgeon: Michael Boston, MD;  Location: [facility];  Service: General;  Laterality: N/A;   SPLENECTOMY, TOTAL     Patient Active Problem List   Diagnosis Date Noted   Elevated immunoglobulin A 05/21/2021   Abnormality of plasma protein 05/05/2021   Abnormal serum protein test 04/22/2021   Hypercalcemia 04/15/2021   Dyspareunia in female 11/11/2018   Atrophic vaginitis 11/11/2018   Acute perforated appendicitis 11/11/2018   Acute appendicitis with perforation and localized peritonitis 11/11/2018   Sjogren's syndrome ([place])    Obesity    Diverticulosis of colon  11/10/2012   Hyperlipidemia 02/24/2007   Essential hypertension 02/24/2007    REFERRING DIAG: N89.5 (ICD-10-CM) - Stricture and atresia of vagina  THERAPY DIAG:  Muscle weakness (generalized)  Other muscle spasm  Unspecified lack of coordination  Stress incontinence (female) (female)  Dyspareunia,  female  Abnormal posture  Rationale for Evaluation and Treatment Rehabilitation  PERTINENT HISTORY: Abdominal hysterectomy/splenectomy/peritonectomy (Rt upper quadrant)/cholecystectomy with antineoplastic agent administered 02/19/20, ileostomy for small bowel obstruction and lysis of adhesions 09/2020, c-section x3  PRECAUTIONS: hx cancer  SUBJECTIVE:                                                                                                                                                                                      SUBJECTIVE STATEMENT:  Pt has used dilators 3x this week and she has ordered larger sizes. She states that  bowel movements are improving, but she still doesn't feel like she completely empties each time. She goes every day.    PAIN:  Are you having pain? No                  07/20/22 SUBJECTIVE STATEMENT: Pt states that she is here due to painful intercourse and controlling her bladder (3-4 years). She started estradiol cream, but stopped taking it until just recently.  Fluid intake: Yes: a lot of water     PAIN:  Are you having pain? Yes NPRS scale: 4-5/10 Pain location: Vaginal   Pain type: burning, sharp, and tight Pain description: intermittent    Aggravating factors: intercourse Relieving factors: no vaginal penetration   PRECAUTIONS: None   WEIGHT BEARING RESTRICTIONS: No   FALLS:  Has patient fallen in last 6 months? No   LIVING ENVIRONMENT: Lives with: lives with their family Lives in: House/apartment   OCCUPATION: retired   PLOF: Independent   PATIENT GOALS: to be able to have intimacy if possible; not to wear pad for urinary incontinence   PERTINENT HISTORY:  Abdominal hysterectomy/splenectomy/peritonectomy (Rt upper quadrant)/cholecystectomy with antineoplastic agent administered 02/19/20, ileostomy for small bowel obstruction and lysis of adhesions 09/2020, c-section x3 Sexual abuse: No   BOWEL MOVEMENT: Pain with bowel movement:  No Type of bowel movement:Frequency daily and Strain No Fully empty rectum: Yes: sometimes takes more than one trip to the bathroom Leakage: No Pads: No Fiber supplement: No *used to have constipation issues - just had ileostomy reversal August 3rd   URINATION: Pain with urination: No Fully empty bladder: Yes: she will leak after urinating though/post void dribbling Stream: Strong Urgency: No Frequency: 3-5x/day Leakage: Coughing, Sneezing, Laughing, and for no reason Pads: Yes: 1 during the day and 1 at night   INTERCOURSE: Pain with intercourse: Initial Penetration and During Penetration Ability to have vaginal penetration:  Yes: very painful Climax: yes Marinoff Scale: 3/3 *using uberlube   PREGNANCY: Vaginal deliveries 0 Tearing No C-section deliveries 3 Currently pregnant No   PROLAPSE: None     OBJECTIVE:  07/20/22:   COGNITION: Overall cognitive status: Within functional limits for tasks assessed                          SENSATION: Light touch: Appears intact Proprioception: Appears intact   MUSCLE LENGTH:     FUNCTIONAL TESTS:      GAIT: Comments: Lt trendelenburg   POSTURE: rounded shoulders, forward head, decreased lumbar lordosis, increased thoracic kyphosis, and posterior pelvic tilt   PELVIC ALIGNMENT:   LUMBARAROM/PROM:   A/PROM A/PROM  eval  Flexion    Extension    Right lateral flexion    Left lateral flexion    Right rotation    Left rotation     (Blank rows = not tested)   LOWER EXTREMITY ROM:     LOWER EXTREMITY MMT:   MMT Right eval Left eval  Hip flexion      Hip extension      Hip abduction      Hip adduction      Hip internal rotation      Hip external rotation      Knee flexion      Knee extension      Ankle dorsiflexion      Ankle plantarflexion      Ankle inversion      Ankle eversion        PALPATION:  General  Significant abdominal scar tissue and restriction, especially over bladder                  External Perineal Exam WNL                             Internal Pelvic Floor restriction and burning throughout superficial pelvic floor; restriction/high tone in all deep layers, but no discomfort reported; flexed coccyx   Patient confirms identification and approves PT to assess internal pelvic floor and treatment Yes   PELVIC MMT:   MMT eval  Vaginal 3/5, 3 second endurance, 5 repeat contraction with poor coordination and difficulty with full relaxation  Internal Anal Sphincter    External Anal Sphincter    Puborectalis    Diastasis Recti 1 finger width separation above umbilicus - difficulty assessing lower due to extensive scar tissue  (Blank rows = not tested)         TONE: high   PROLAPSE: Unable to see any this treatment session   TODAY'S TREATMENT 09/30/22 Neuromuscular re-education: Pelvic floor contraction seated 5x with breath coordination  Supine march for core facilitation 2 x 10 Side lying UE ball press 10x bil Exercises: Open books 10x bil BKFO 10x bil Clam shell 10x bil Reverse clam shell 10x bil Pelvic tilts 10x Butterfly stretch 2 min Piriformis stretch 60 sec bil    TREATMENT 09/23/22 Manual: Scar tissue mobilization in abdomen Exercises: Lower trunk rotation 2 x 10 Wide leg propped lower trunk rotation 2 x 10 Butterfly stretch in seated 2 minutes Pelvic tilts 2 x 10 Seated forward fold Seated lateral fold Therapeutic activities: Dilators/progression   TREATMENT 09/16/22 Neuromuscular re-education: UE ball press 5x Supine weight drop 2 x 10 5lb weight Exercises: Wide foot knee rocking Supine piriformis stretch 60 sec bil Butterfly stretch 2 min Clam shells 2 x 10 bil Reverse clam shells 2 x 10 bil Therapeutic activities: Squatty potty/relaxed toilet mechanics - practice  Balloon breathing Self-bowel massage    PATIENT EDUCATION:  Education details: see above Person educated: Patient Education method: Explanation, Demonstration,  Tactile cues, Verbal cues, and Handouts Education comprehension: verbalized understanding   HOME EXERCISE PROGRAM: [id number]   ASSESSMENT:   CLINICAL IMPRESSION: Pt is overall doing well. We discussed POC moving forward and will plan to perform re-evaluation next session and then have visits about 1x/month. We reviewed and progressed HEP. Cuing for correct form and reminders about what each exercise is were required. Pt will continue to benefit from skilled PT intervention in order to decrease stress urinary incontinence, improve dyspareunia, and address impairments.    OBJECTIVE IMPAIRMENTS: decreased activity tolerance, decreased coordination, decreased endurance, decreased strength, hypomobility, increased fascial restrictions, increased muscle spasms, impaired tone, postural dysfunction, and pain.    ACTIVITY LIMITATIONS: continence   PARTICIPATION LIMITATIONS: interpersonal relationship   PERSONAL FACTORS: 3+ comorbidities: Abdominal hysterectomy/splenectomy/peritonectomy (Rt upper quadrant)/cholecystectomy with antineoplastic agent administered 02/19/20, ileostomy for small bowel obstruction and lysis of adhesions 09/2020, c-section x3  are also affecting patient's functional outcome.    REHAB POTENTIAL: Good   CLINICAL DECISION MAKING: Stable/uncomplicated   EVALUATION COMPLEXITY: Low     GOALS: Goals reviewed with patient? Yes   SHORT TERM GOALS: Target date: 08/27/22 - updated 09/02/22   Pt will be independent with HEP.    Baseline: Goal status: MET 09/02/22   2.  Pt will be independent with the knack, urge suppression technique, and double voiding in order  to improve bladder habits and decrease urinary incontinence.    Baseline:  Goal status: IN PROGRESS   3.  Pt will be independent with diaphragmatic breathing and down training activities in order to improve pelvic floor relaxation.   Baseline: Goal status: IN PROGRESS   LONG TERM GOALS: Target date: 10/12/22 - updated  09/02/22   Pt will be independent with HEP.    Baseline:  Goal status: IN PROGRESS   2.  Pt will demonstrate normal pelvic floor muscle tone and A/ROM, able to achieve 4/5 strength with contractions and 10 sec endurance, in order to provide appropriate lumbopelvic support in functional activities.    Baseline:  Goal status: IN PROGRESS   3.  Pt will report no leaks with laughing, coughing, sneezing in order to improve comfort with interpersonal relationships and community activities.    Baseline:  Goal status: IN PROGRESS   4.  Pt will report 0/10 pain with vaginal penetration in order to improve intimate relationship with partner.     Baseline:  Goal status: IN PROGRESS       PLAN:   PT FREQUENCY: 1-2x/week   PT DURATION: 12 weeks   PLANNED INTERVENTIONS: Therapeutic exercises, Therapeutic activity, Neuromuscular re-education, Balance training, Gait training, Patient/Family education, Self Care, Joint mobilization, Dry Needling, Biofeedback, and Manual therapy   PLAN FOR NEXT SESSION: Re-evaluation; update exercises next session (bridge, seated hip strengthening)  Heather Roberts, PT, DPT03/20/243:26 PM

## 2022-10-07 ENCOUNTER — Ambulatory Visit: Payer: PPO | Admitting: Physical Therapy

## 2022-10-13 ENCOUNTER — Ambulatory Visit: Payer: PPO | Attending: Obstetrics & Gynecology

## 2022-10-13 DIAGNOSIS — M6281 Muscle weakness (generalized): Secondary | ICD-10-CM

## 2022-10-13 DIAGNOSIS — N941 Unspecified dyspareunia: Secondary | ICD-10-CM

## 2022-10-13 DIAGNOSIS — R293 Abnormal posture: Secondary | ICD-10-CM

## 2022-10-13 DIAGNOSIS — M62838 Other muscle spasm: Secondary | ICD-10-CM | POA: Diagnosis not present

## 2022-10-13 DIAGNOSIS — R279 Unspecified lack of coordination: Secondary | ICD-10-CM | POA: Diagnosis not present

## 2022-10-13 DIAGNOSIS — N393 Stress incontinence (female) (male): Secondary | ICD-10-CM

## 2022-10-13 NOTE — Therapy (Signed)
OUTPATIENT PHYSICAL THERAPY TREATMENT NOTE   Patient Name: Kristy Sherman MRN: [id number] DOB:11-12-1950, 72 y.o., female Today's Date: 10/13/2022  PCP: Leeroy Cha, MD REFERRING PROVIDER: Waymon Amato, MD  END OF SESSION:   PT End of Session - 10/13/22 1148     Visit Number 8    Date for PT Re-Evaluation 12/22/22    Authorization Type [employer]    PT Start Time 1145    PT Stop Time 1225    PT Time Calculation (min) 40 min    Activity Tolerance Patient tolerated treatment well    Behavior During Therapy [facility] for tasks assessed/performed                Past Medical History:  Diagnosis Date   ANKLE PAIN, LEFT 10/17/2007   Qualifier: Diagnosis of  By: Jerold Coombe     Hyperlipidemia    Hypertension    Obesity    Sjoegren syndrome    Past Surgical History:  Procedure Laterality Date   ABDOMINAL HYSTERECTOMY     with HIPEC   ABDOMINAL [place], 1982, [place] N/A 11/11/2018   Procedure: APPENDECTOMY LAPAROSCOPIC;  Surgeon: Michael Boston, MD;  Location: [facility];  Service: General;  Laterality: N/A;   SPLENECTOMY, TOTAL     Patient Active Problem List   Diagnosis Date Noted   Elevated immunoglobulin A 05/21/2021   Abnormality of plasma protein 05/05/2021   Abnormal serum protein test 04/22/2021   Hypercalcemia 04/15/2021   Dyspareunia in female 11/11/2018   Atrophic vaginitis 11/11/2018   Acute perforated appendicitis 11/11/2018   Acute appendicitis with perforation and localized peritonitis 11/11/2018   Sjogren's syndrome    Obesity    Diverticulosis of colon  11/10/2012   Hyperlipidemia 02/24/2007   Essential hypertension 02/24/2007    REFERRING DIAG: N89.5 (ICD-10-CM) - Stricture and atresia of vagina  THERAPY DIAG:  Muscle weakness (generalized) - Plan: PT plan of care cert/re-cert  Other muscle spasm - Plan: PT plan of care cert/re-cert  Unspecified lack of  coordination - Plan: PT plan of care cert/re-cert  Stress incontinence (female) (female) - Plan: PT plan of care cert/re-cert  Dyspareunia, female - Plan: PT plan of care cert/re-cert  Abnormal posture - Plan: PT plan of care cert/re-cert  Rationale for Evaluation and Treatment Rehabilitation  PERTINENT HISTORY: Abdominal hysterectomy/splenectomy/peritonectomy (Rt upper quadrant)/cholecystectomy with antineoplastic agent administered 02/19/20, ileostomy for small bowel obstruction and lysis of adhesions 09/2020, c-section x3  PRECAUTIONS: hx cancer  SUBJECTIVE:  SUBJECTIVE STATEMENT:  Pt states that she still has not received dilators, but will begin working with larger sizes as soon as she does.    PAIN:  Are you having pain? No                  07/20/22 SUBJECTIVE STATEMENT: Pt states that she is here due to painful intercourse and controlling her bladder (3-4 years). She started estradiol cream, but stopped taking it until just recently.  Fluid intake: Yes: a lot of water     PAIN:  Are you having pain? Yes NPRS scale: 4-5/10 Pain location: Vaginal   Pain type: burning, sharp, and tight Pain description: intermittent    Aggravating factors: intercourse Relieving factors: no vaginal penetration   PRECAUTIONS: None   WEIGHT BEARING RESTRICTIONS: No   FALLS:  Has patient fallen in last 6 months? No   LIVING ENVIRONMENT: Lives with: lives with their family Lives in: House/apartment   OCCUPATION: retired   PLOF: Independent   PATIENT GOALS: to be able to have intimacy if possible; not to wear pad for urinary incontinence   PERTINENT HISTORY:  Abdominal hysterectomy/splenectomy/peritonectomy (Rt upper quadrant)/cholecystectomy with antineoplastic agent administered 02/19/20, ileostomy for small  bowel obstruction and lysis of adhesions 09/2020, c-section x3 Sexual abuse: No   BOWEL MOVEMENT: Pain with bowel movement: No Type of bowel movement:Frequency daily and Strain No Fully empty rectum: Yes: sometimes takes more than one trip to the bathroom Leakage: No Pads: No Fiber supplement: No *used to have constipation issues - just had ileostomy reversal August 3rd   URINATION: Pain with urination: No Fully empty bladder: Yes: she will leak after urinating though/post void dribbling Stream: Strong Urgency: No Frequency: 3-5x/day Leakage: Coughing, Sneezing, Laughing, and for no reason Pads: Yes: 1 during the day and 1 at night   INTERCOURSE: Pain with intercourse: Initial Penetration and During Penetration Ability to have vaginal penetration:  Yes: very painful Climax: yes Marinoff Scale: 3/3 *using uberlube   PREGNANCY: Vaginal deliveries 0 Tearing No C-section deliveries 3 Currently pregnant No   PROLAPSE: None     OBJECTIVE:  10/13/22:   Internal Pelvic Floor: tone and tenderness largely improved throughout superficial and deep pelvic floor; restriction and pain still present at posterior fourchette, but more localized to specific locations (Rt>Lt)   Patient confirms identification and approves PT to assess internal pelvic floor and treatment Yes   PELVIC MMT:   MMT eval  Vaginal 3/5, 7 second endurance, 7 repeat contraction with poor coordination and difficulty with full relaxation  (Blank rows = not tested)         TONE: WNL  07/20/22:   COGNITION: Overall cognitive status: Within functional limits for tasks assessed                          SENSATION: Light touch: Appears intact Proprioception: Appears intact   GAIT: Comments: Lt trendelenburg   POSTURE: rounded shoulders, forward head, decreased lumbar lordosis, increased thoracic kyphosis, and posterior pelvic tilt  PALPATION:   General  Significant abdominal scar tissue and restriction,  especially over bladder                 External Perineal Exam WNL                             Internal Pelvic Floor restriction and burning throughout superficial pelvic floor; restriction/high tone in  all deep layers, but no discomfort reported; flexed coccyx   Patient confirms identification and approves PT to assess internal pelvic floor and treatment Yes   PELVIC MMT:   MMT eval  Vaginal 3/5, 3 second endurance, 5 repeat contraction with poor coordination and difficulty with full relaxation  Internal Anal Sphincter    External Anal Sphincter    Puborectalis    Diastasis Recti 1 finger width separation above umbilicus - difficulty assessing lower due to extensive scar tissue  (Blank rows = not tested)         TONE: high   PROLAPSE: Unable to see any this treatment session   TODAY'S TREATMENT 10/13/22 RE-EVALUATION  Manual: Pt provides verbal consent for internal vaginal/rectal pelvic floor exam. Perineal scar tissue mobilization Exercises: Clam shell 10x bil Reverse clam shell 10x bil Bridge with hip adduction 10x Seated hip adduction 10x green band Seated hip abduction 10x green band Seated hip internal rotation 10x yellow loop    TREATMENT 09/30/22 Neuromuscular re-education: Pelvic floor contraction seated 5x with breath coordination  Supine march for core facilitation 2 x 10 Side lying UE ball press 10x bil Exercises: Open books 10x bil BKFO 10x bil Clam shell 10x bil Reverse clam shell 10x bil Pelvic tilts 10x Butterfly stretch 2 min Piriformis stretch 60 sec bil    TREATMENT 09/23/22 Manual: Scar tissue mobilization in abdomen Exercises: Lower trunk rotation 2 x 10 Wide leg propped lower trunk rotation 2 x 10 Butterfly stretch in seated 2 minutes Pelvic tilts 2 x 10 Seated forward fold Seated lateral fold Therapeutic activities: Dilators/progression  PATIENT EDUCATION:  Education details: see above Person educated: Patient Education  method: Consulting civil engineer, Demonstration, Tactile cues, Verbal cues, and Handouts Education comprehension: verbalized understanding   HOME EXERCISE PROGRAM: [id number]   ASSESSMENT:   CLINICAL IMPRESSION: Pt continues to make excellent progress with pelvic floor physical therapy with good reduction in pain with intercourse. She has also seen good improvement with stress incontinence. Internal pelvic floor vaginal exam demonstrated improved coordination with contraction strength and increase in pelvic floor endurance. She does still have some pain and tenderness in superficial pelvic floor with associated restriction, Rt>Lt. We have discussed dilator progression, but she has not been able to yet due to delay in getting dilators. HEP updated with new pelvic strengthening. Pt will continue to benefit from skilled PT intervention in order to decrease stress urinary incontinence, improve dyspareunia, and address impairments.    OBJECTIVE IMPAIRMENTS: decreased activity tolerance, decreased coordination, decreased endurance, decreased strength, hypomobility, increased fascial restrictions, increased muscle spasms, impaired tone, postural dysfunction, and pain.    ACTIVITY LIMITATIONS: continence   PARTICIPATION LIMITATIONS: interpersonal relationship   PERSONAL FACTORS: 3+ comorbidities: Abdominal hysterectomy/splenectomy/peritonectomy (Rt upper quadrant)/cholecystectomy with antineoplastic agent administered 02/19/20, ileostomy for small bowel obstruction and lysis of adhesions 09/2020, c-section x3  are also affecting patient's functional outcome.    REHAB POTENTIAL: Good   CLINICAL DECISION MAKING: Stable/uncomplicated   EVALUATION COMPLEXITY: Low     GOALS: Goals reviewed with patient? Yes   SHORT TERM GOALS: Target date: 08/27/22 - updated 09/02/22 - updated 10/13/22    Pt will be independent with HEP.    Baseline: Goal status: MET 09/02/22   2.  Pt will be independent with the knack, urge  suppression technique, and double voiding in order to improve bladder habits and decrease urinary incontinence.    Baseline:  Goal status: MET 10/13/22   3.  Pt will be independent with diaphragmatic breathing and down  training activities in order to improve pelvic floor relaxation.   Baseline: Goal status: MET 10/13/22   LONG TERM GOALS: Target date: 10/12/22 - updated 09/02/22 - updated 10/13/22 with new goal 12/22/22   Pt will be independent with HEP.    Baseline:  Goal status: IN PROGRESS   2.  Pt will demonstrate normal pelvic floor muscle tone and A/ROM, able to achieve 4/5 strength with contractions and 10 sec endurance, in order to provide appropriate lumbopelvic support in functional activities.    Baseline:  Goal status: IN PROGRESS   3.  Pt will report no leaks with laughing, coughing, sneezing in order to improve comfort with interpersonal relationships and community activities.    Baseline:  Goal status: IN PROGRESS   4.  Pt will report 0/10 pain with vaginal penetration in order to improve intimate relationship with partner.     Baseline:  Goal status: IN PROGRESS       PLAN:   PT FREQUENCY: 1-2x/week   PT DURATION: 12 weeks   PLANNED INTERVENTIONS: Therapeutic exercises, Therapeutic activity, Neuromuscular re-education, Balance training, Gait training, Patient/Family education, Self Care, Joint mobilization, Dry Needling, Biofeedback, and Manual therapy   PLAN FOR NEXT SESSION: Review dilator exercises and possible progression  Heather Roberts, PT, DPT04/08/2410:34 PM

## 2022-10-25 IMAGING — CT CT ABD-PELV W/ CM
2 of 5 series · 14 of 46 positions shown, 16 images · IV contrast (Omnipaque)
Comparison: 11/11/2018

CLINICAL DATA: Nausea, vomiting, abdominal pain

EXAM:
CT ABDOMEN AND PELVIS WITH CONTRAST
TECHNIQUE: Multidetector CT imaging of the abdomen and pelvis was performed
using the standard protocol following bolus administration of
intravenous contrast.
CONTRAST:  100mL OMNIPAQUE IOHEXOL 300 MG/ML  SOLN

[Series 2: axial st · axial · 0.98mm/px · z∈[-748,-342]mm · 11 of 91 slices shown, 13 images]
[im 5/91  soft-tissue]
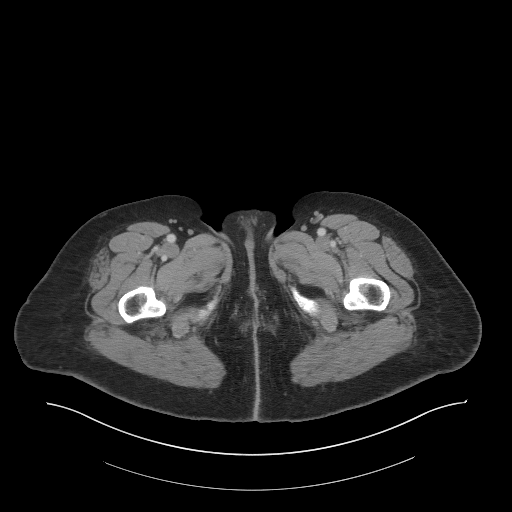
[im 5/91  bone]
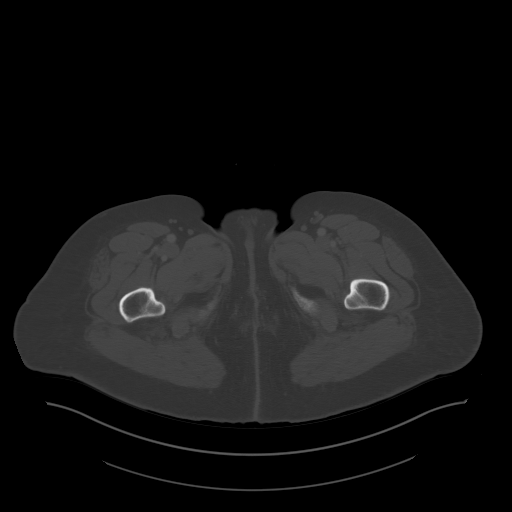
[im 13/91  soft-tissue]
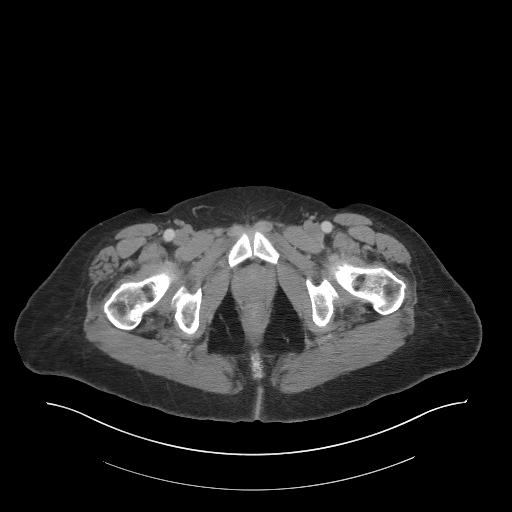
[im 21/91  soft-tissue]
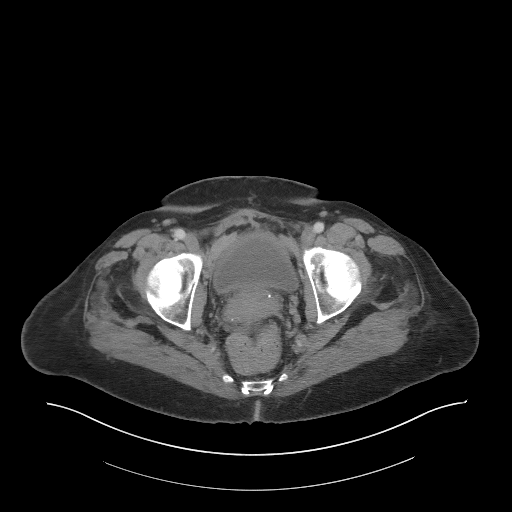
[im 29/91  soft-tissue]
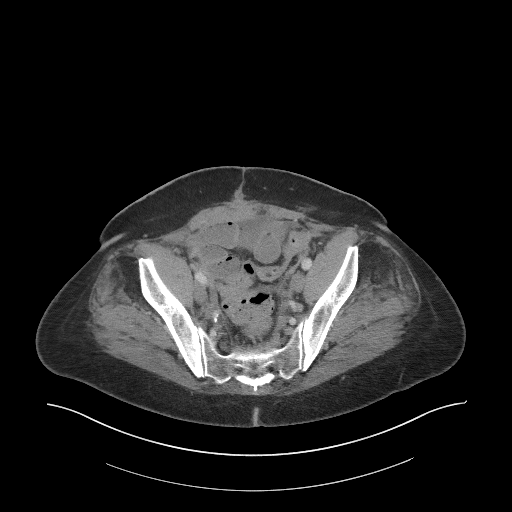
[im 37/91  soft-tissue]
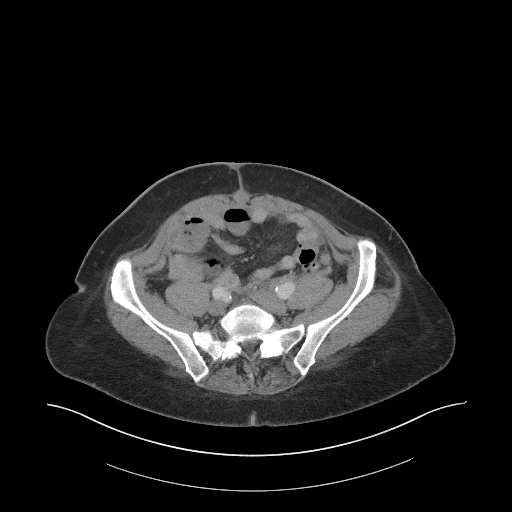
[im 46/91  soft-tissue]
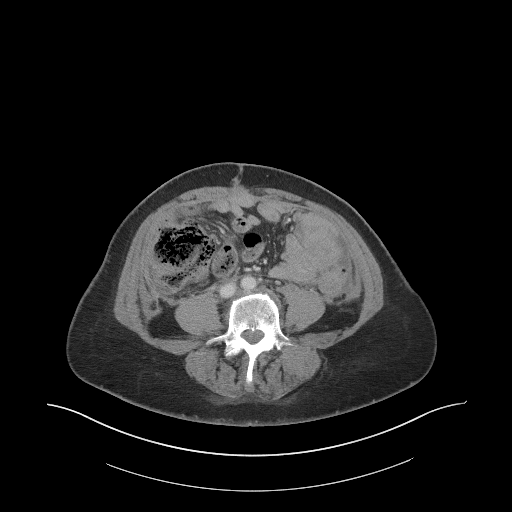
[im 54/91  soft-tissue]
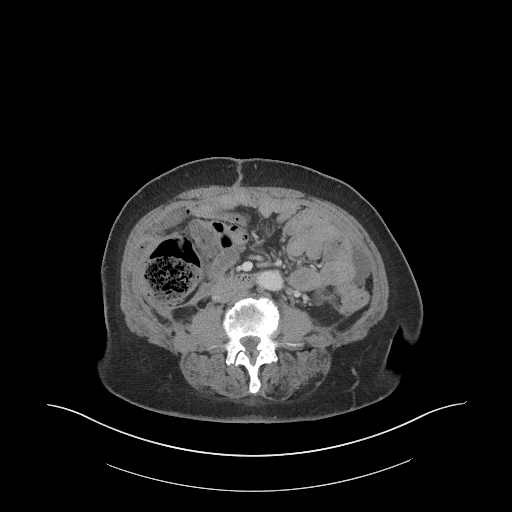
[im 62/91  soft-tissue]
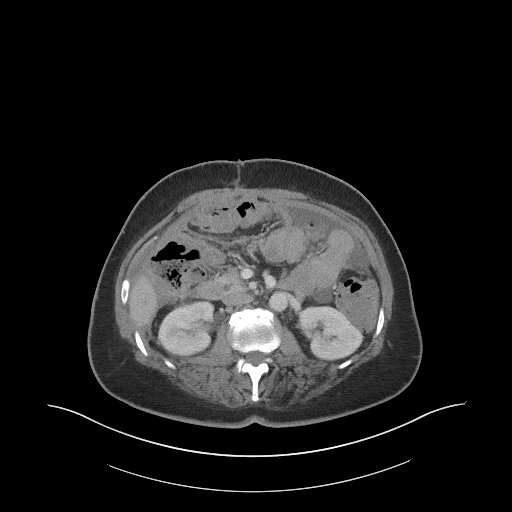
[im 70/91  soft-tissue]
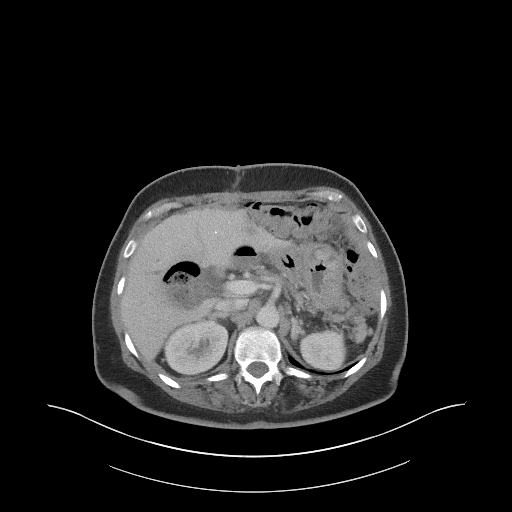
[im 70/91  bone]
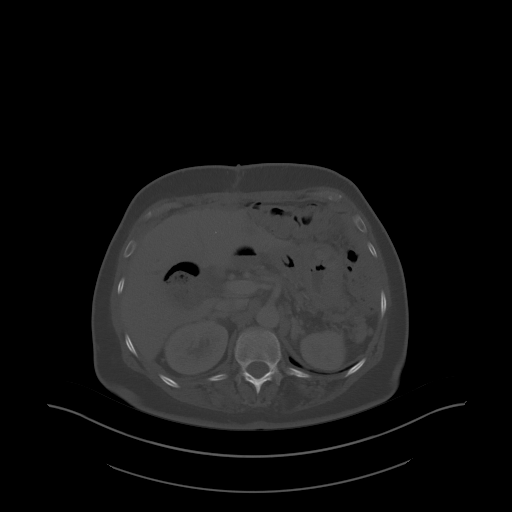
[im 78/91  soft-tissue]
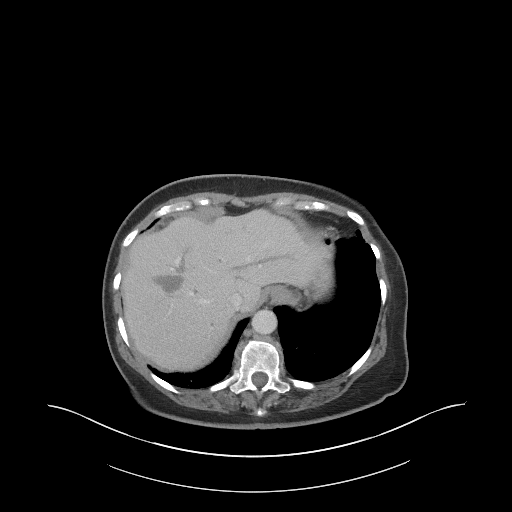
[im 86/91  soft-tissue]
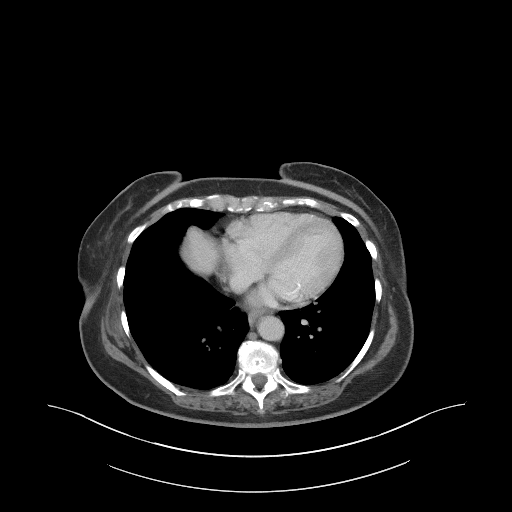

[Series 5: coronal st · coronal · 0.64mm/px · 3 of 85 slices shown]
[im 29/85  soft-tissue]
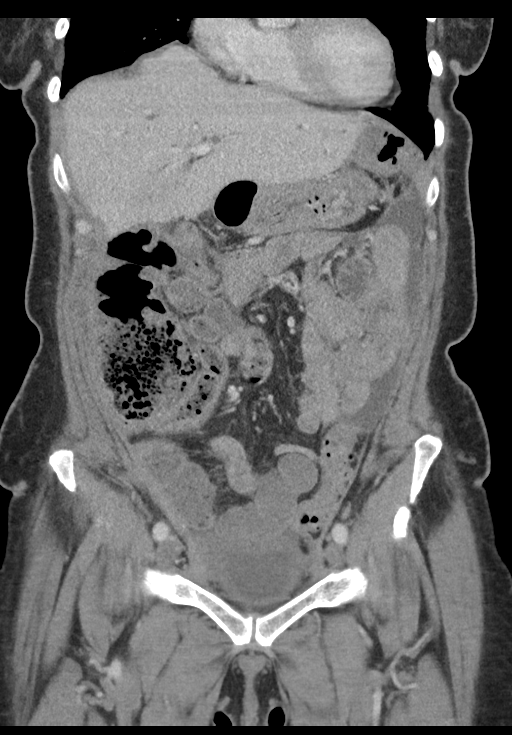
[im 38/85  soft-tissue]
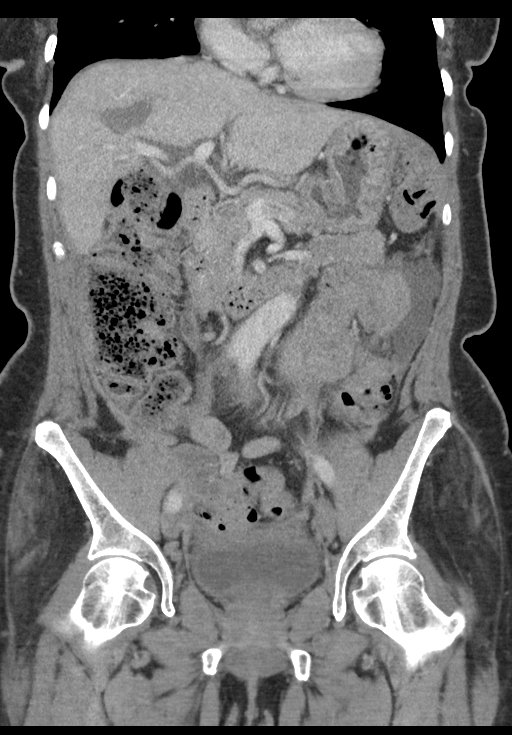
[im 47/85  soft-tissue]
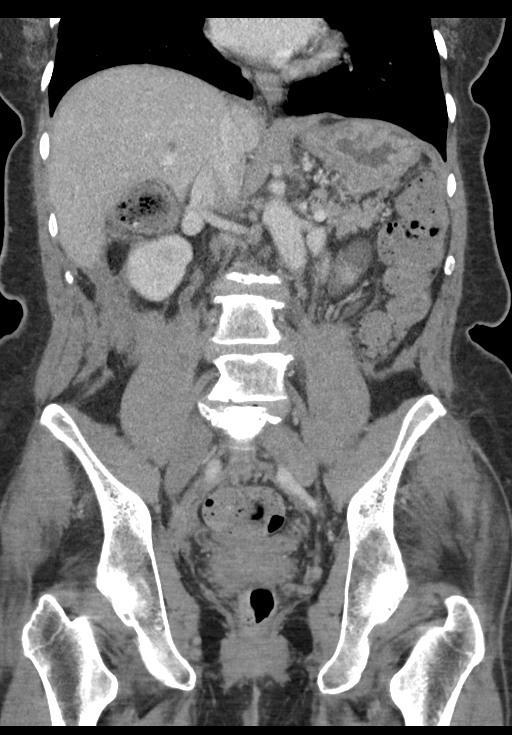

[14 of 46 positions shown; findings below may reference images not displayed]

FINDINGS: Lower chest: The visualized heart and pericardium are unremarkable.
The visualized lung bases are clear.

Hepatobiliary: Since the prior examination, there has developed mild
to moderate intra and extrahepatic biliary ductal dilation. The
gallbladder is not clearly identified and there is fecalized
contents within the gallbladder fossa. While this may simply
represent: Migrating into this space following cholecystectomy, if
there has been no history of interval surgery, this may represent a
fistula between the gallbladder lumen and adjacent colon. Within the
liver, a a hypoattenuating lesion is seen measuring 1.8 x 3.2 cm in
within the right hepatic lobe on axial image # [DATE]. No other
intrahepatic lesions identified.

Pancreas: Unremarkable

Spleen: Interval splenectomy.

Adrenals/Urinary Tract: The adrenal glands, kidneys, and bladder are
unremarkable.

Stomach/Bowel: Appendectomy has been performed. Moderate stool is
seen throughout the colon. As noted above, there is fecalized
contents within the gallbladder fossa and it is unclear whether this
represents fistulization between the gallbladder and right: (Axial
image # [DATE], or herniation of bowel into a defect related to
interval cholecystectomy. There is no evidence of obstruction or
focal inflammation. There has developed loculated ascites within the
left upper quadrant as well as peritoneal thickening diffusely
within the abdomen suspicious for malignant ascites or, given the
history of appendectomy, pseudomyxoma peritonei in the setting of an
appendiceal neoplasm. Alternatively, this may simply represent
postsurgical fluid in the recent postsurgical state. No free
intraperitoneal gas.

Vascular/Lymphatic: The common iliac arteries appear obliterated
centrally at the confluence of the inferior vena cava possibly
representing focal chronic thrombosis. The abdominal aorta
demonstrates mild atherosclerotic calcification. No aortic aneurysm.
No pathologic adenopathy within the abdomen and pelvis.

Reproductive: Status post hysterectomy. No adnexal masses.

Other: Laparotomy incision scar noted.  No abdominal wall hernia.

Musculoskeletal: No acute bone abnormality.
IMPRESSION: Complex examination.  Interval splenectomy.

Development of loculated ascites and diffuse peritoneal thickening.
Differential considerations include pseudomyxoma peritonei in the
setting of an appendiceal neoplasm, malignant ascites, or
postsurgical change in the recent postsurgical state.

Nonvisualization of the gallbladder with fecalized content within
the gallbladder fossa. In absence of a history of a cholecystectomy,
this may represent a fistula between the right colon and gallbladder
lumen. Alternatively, this may simply represent herniation of bowel
into the surgical defect/gallbladder fossa. Correlation with
surgical history would be helpful in differentiating these entities.

Interval development of a moderate intra and extrahepatic biliary
ductal dilation. Correlation with liver enzymes would be helpful to
assess for a distal obstructing lesion. In the setting of a colonic
fistula, this may relate to debris/inflammatory change within the
biliary tree.

Indeterminate 3.2 cm low-attenuation lesion within the right hepatic
lobe. This could be better assessed with MRI examination.

## 2022-11-13 ENCOUNTER — Ambulatory Visit
Admission: RE | Admit: 2022-11-13 | Discharge: 2022-11-13 | Disposition: A | Discharge status: Home / Self Care | Payer: PPO | Source: Ambulatory Visit | Attending: Internal Medicine | Admitting: Internal Medicine

## 2022-11-13 ENCOUNTER — Other Ambulatory Visit: Payer: Self-pay | Admitting: Internal Medicine

## 2022-11-13 DIAGNOSIS — E785 Hyperlipidemia, unspecified: Secondary | ICD-10-CM | POA: Diagnosis not present

## 2022-11-13 DIAGNOSIS — M25561 Pain in right knee: Secondary | ICD-10-CM

## 2022-11-13 DIAGNOSIS — M25569 Pain in unspecified knee: Secondary | ICD-10-CM | POA: Diagnosis not present

## 2022-11-13 DIAGNOSIS — I10 Essential (primary) hypertension: Secondary | ICD-10-CM | POA: Diagnosis not present

## 2022-11-13 DIAGNOSIS — E559 Vitamin D deficiency, unspecified: Secondary | ICD-10-CM | POA: Diagnosis not present

## 2022-11-26 ENCOUNTER — Ambulatory Visit: Payer: PPO

## 2022-12-22 DIAGNOSIS — D649 Anemia, unspecified: Secondary | ICD-10-CM | POA: Diagnosis not present

## 2022-12-22 DIAGNOSIS — J069 Acute upper respiratory infection, unspecified: Secondary | ICD-10-CM | POA: Diagnosis not present

## 2022-12-22 DIAGNOSIS — R5383 Other fatigue: Secondary | ICD-10-CM | POA: Diagnosis not present

## 2022-12-24 ENCOUNTER — Ambulatory Visit: Payer: PPO | Attending: Obstetrics & Gynecology

## 2022-12-24 DIAGNOSIS — N941 Unspecified dyspareunia: Secondary | ICD-10-CM | POA: Diagnosis not present

## 2022-12-24 DIAGNOSIS — R293 Abnormal posture: Secondary | ICD-10-CM | POA: Diagnosis not present

## 2022-12-24 DIAGNOSIS — M62838 Other muscle spasm: Secondary | ICD-10-CM | POA: Insufficient documentation

## 2022-12-24 DIAGNOSIS — M6281 Muscle weakness (generalized): Secondary | ICD-10-CM | POA: Diagnosis not present

## 2022-12-24 DIAGNOSIS — N393 Stress incontinence (female) (male): Secondary | ICD-10-CM | POA: Insufficient documentation

## 2022-12-24 DIAGNOSIS — R279 Unspecified lack of coordination: Secondary | ICD-10-CM | POA: Insufficient documentation

## 2022-12-24 NOTE — Therapy (Signed)
OUTPATIENT PHYSICAL THERAPY TREATMENT NOTE   Patient Name: Kristy Sherman MRN: [id number] DOB:13-Aug-1950, 72 y.o., female Today's Date: 12/24/2022  PCP: Lorenda Ishihara, MD REFERRING PROVIDER: Hoover Browns, MD  END OF SESSION:   PT End of Session - 12/24/22 1400     Visit Number 9    Date for PT Re-Evaluation 12/22/22    Authorization Type [employer]    PT Start Time 1400    PT Stop Time 1430    PT Time Calculation (min) 30 min    Activity Tolerance Patient tolerated treatment well    Behavior During Therapy [facility] for tasks assessed/performed                 Past Medical History:  Diagnosis Date   ANKLE PAIN, LEFT 10/17/2007   Qualifier: Diagnosis of  By: Janit Bern     Hyperlipidemia    Hypertension    Obesity    Sjoegren syndrome    Past Surgical History:  Procedure Laterality Date   ABDOMINAL HYSTERECTOMY     with HIPEC   ABDOMINAL SURGERY     CESAREAN SECTION  1980, 1982, 1986   CHOLECYSTECTOMY     LAPAROSCOPIC APPENDECTOMY N/A 11/11/2018   Procedure: APPENDECTOMY LAPAROSCOPIC;  Surgeon: Karie Soda, MD;  Location: [facility];  Service: General;  Laterality: N/A;   SPLENECTOMY, TOTAL     Patient Active Problem List   Diagnosis Date Noted   Elevated immunoglobulin A 05/21/2021   Abnormality of plasma protein 05/05/2021   Abnormal serum protein test 04/22/2021   Hypercalcemia 04/15/2021   Dyspareunia in female 11/11/2018   Atrophic vaginitis 11/11/2018   Acute perforated appendicitis 11/11/2018   Acute appendicitis with perforation and localized peritonitis 11/11/2018   Sjogren's syndrome (HCC)    Obesity    Diverticulosis of colon  11/10/2012   Hyperlipidemia 02/24/2007   Essential hypertension 02/24/2007    REFERRING DIAG: N89.5 (ICD-10-CM) - Stricture and atresia of vagina  THERAPY DIAG:  Muscle weakness (generalized) - Plan: PT plan of care cert/re-cert  Other muscle spasm - Plan: PT plan of care  cert/re-cert  Unspecified lack of coordination - Plan: PT plan of care cert/re-cert  Stress incontinence (female) (female) - Plan: PT plan of care cert/re-cert  Dyspareunia, female - Plan: PT plan of care cert/re-cert  Abnormal posture - Plan: PT plan of care cert/re-cert  Rationale for Evaluation and Treatment Rehabilitation  PERTINENT HISTORY: Abdominal hysterectomy/splenectomy/peritonectomy (Rt upper quadrant)/cholecystectomy with antineoplastic agent administered 02/19/20, ileostomy for small bowel obstruction and lysis of adhesions 09/2020, c-section x3  PRECAUTIONS: hx cancer  SUBJECTIVE:  SUBJECTIVE STATEMENT:  Pt would like for this to be her last visit. She is not noticing any urinary incontinence. She feels like she just has to work with the dilators to help with dyspareunia and she goes through phases of wanting and not wanting to do that. She feels like a lot of the time it's a relaxation issue.    PAIN:  Are you having pain? No                  07/20/22 SUBJECTIVE STATEMENT: Pt states that she is here due to painful intercourse and controlling her bladder (3-4 years). She started estradiol cream, but stopped taking it until just recently.  Fluid intake: Yes: a lot of water     PAIN:  Are you having pain? Yes NPRS scale: 4-5/10 Pain location: Vaginal   Pain type: burning, sharp, and tight Pain description: intermittent    Aggravating factors: intercourse Relieving factors: no vaginal penetration   PRECAUTIONS: None   WEIGHT BEARING RESTRICTIONS: No   FALLS:  Has patient fallen in last 6 months? No   LIVING ENVIRONMENT: Lives with: lives with their family Lives in: House/apartment   OCCUPATION: retired   PLOF: Independent   PATIENT GOALS: to be able to have intimacy if possible;  not to wear pad for urinary incontinence   PERTINENT HISTORY:  Abdominal hysterectomy/splenectomy/peritonectomy (Rt upper quadrant)/cholecystectomy with antineoplastic agent administered 02/19/20, ileostomy for small bowel obstruction and lysis of adhesions 09/2020, c-section x3 Sexual abuse: No   BOWEL MOVEMENT: Pain with bowel movement: No Type of bowel movement:Frequency daily and Strain No Fully empty rectum: Yes: sometimes takes more than one trip to the bathroom Leakage: No Pads: No Fiber supplement: No *used to have constipation issues - just had ileostomy reversal August 3rd   URINATION: Pain with urination: No Fully empty bladder: Yes: she will leak after urinating though/post void dribbling Stream: Strong Urgency: No Frequency: 3-5x/day Leakage: Coughing, Sneezing, Laughing, and for no reason Pads: Yes: 1 during the day and 1 at night   INTERCOURSE: Pain with intercourse: Initial Penetration and During Penetration Ability to have vaginal penetration:  Yes: very painful Climax: yes Marinoff Scale: 3/3 *using uberlube   PREGNANCY: Vaginal deliveries 0 Tearing No C-section deliveries 3 Currently pregnant No   PROLAPSE: None     OBJECTIVE:  12/24/22: No formal pelvic floor examination this session Scar tissue restriction in abdomen still present, Lt>Rt with discomfort and Lt rib flare  10/13/22:   Internal Pelvic Floor: tone and tenderness largely improved throughout superficial and deep pelvic floor; restriction and pain still present at posterior fourchette, but more localized to specific locations (Rt>Lt)   Patient confirms identification and approves PT to assess internal pelvic floor and treatment Yes   PELVIC MMT:   MMT eval  Vaginal 3/5, 7 second endurance, 7 repeat contraction with poor coordination and difficulty with full relaxation  (Blank rows = not tested)         TONE: WNL  07/20/22:   COGNITION: Overall cognitive status: Within functional  limits for tasks assessed                          SENSATION: Light touch: Appears intact Proprioception: Appears intact   GAIT: Comments: Lt trendelenburg   POSTURE: rounded shoulders, forward head, decreased lumbar lordosis, increased thoracic kyphosis, and posterior pelvic tilt  PALPATION:   General  Significant abdominal scar tissue and restriction, especially over bladder  External Perineal Exam WNL                             Internal Pelvic Floor restriction and burning throughout superficial pelvic floor; restriction/high tone in all deep layers, but no discomfort reported; flexed coccyx   Patient confirms identification and approves PT to assess internal pelvic floor and treatment Yes   PELVIC MMT:   MMT eval  Vaginal 3/5, 3 second endurance, 5 repeat contraction with poor coordination and difficulty with full relaxation  Internal Anal Sphincter    External Anal Sphincter    Puborectalis    Diastasis Recti 1 finger width separation above umbilicus - difficulty assessing lower due to extensive scar tissue  (Blank rows = not tested)         TONE: high   PROLAPSE: Unable to see any this treatment session   TODAY'S TREATMENT 12/24/22 Manual: Abdominal soft tissue mobilization Abdominal scar tissue mobilization Therapeutic activities: Dilator review   TREATMENT 10/13/22 RE-EVALUATION  Manual: Pt provides verbal consent for internal vaginal/rectal pelvic floor exam. Perineal scar tissue mobilization Exercises: Clam shell 10x bil Reverse clam shell 10x bil Bridge with hip adduction 10x Seated hip adduction 10x green band Seated hip abduction 10x green band Seated hip internal rotation 10x yellow loop    TREATMENT 09/30/22 Neuromuscular re-education: Pelvic floor contraction seated 5x with breath coordination  Supine march for core facilitation 2 x 10 Side lying UE ball press 10x bil Exercises: Open books 10x bil BKFO 10x bil Clam shell  10x bil Reverse clam shell 10x bil Pelvic tilts 10x Butterfly stretch 2 min Piriformis stretch 60 sec bil    TREATMENT 09/23/22 Manual: Scar tissue mobilization in abdomen Exercises: Lower trunk rotation 2 x 10 Wide leg propped lower trunk rotation 2 x 10 Butterfly stretch in seated 2 minutes Pelvic tilts 2 x 10 Seated forward fold Seated lateral fold Therapeutic activities: Dilators/progression  PATIENT EDUCATION:  Education details: see above Person educated: Patient Education method: Explanation, Demonstration, Tactile cues, Verbal cues, and Handouts Education comprehension: verbalized understanding   HOME EXERCISE PROGRAM: [id number]   ASSESSMENT:   CLINICAL IMPRESSION: Pt has made excellent progress in pelvic floor physical therapy. She is not having any issues with bladder currently and demonstrated good improvement in abdominal scar tissue mobility. She does still have pain with vaginal penetration, but has found other ways to be intimate with partner that she enjoys and are not painful. She does not feel like she wants to work on dilator program at this time, but feels like she has the tools to pick this back up when she feels ready. Due to progress and having appropriate tools to continue making progress, she is prepared to DC/ skilled PT intervention at this time.    OBJECTIVE IMPAIRMENTS: decreased activity tolerance, decreased coordination, decreased endurance, decreased strength, hypomobility, increased fascial restrictions, increased muscle spasms, impaired tone, postural dysfunction, and pain.    ACTIVITY LIMITATIONS: continence   PARTICIPATION LIMITATIONS: interpersonal relationship   PERSONAL FACTORS: 3+ comorbidities: Abdominal hysterectomy/splenectomy/peritonectomy (Rt upper quadrant)/cholecystectomy with antineoplastic agent administered 02/19/20, ileostomy for small bowel obstruction and lysis of adhesions 09/2020, c-section x3  are also affecting patient's  functional outcome.    REHAB POTENTIAL: Good   CLINICAL DECISION MAKING: Stable/uncomplicated   EVALUATION COMPLEXITY: Low     GOALS: Goals reviewed with patient? Yes   SHORT TERM GOALS: Target date: 08/27/22 - updated 09/02/22 - updated 10/13/22 - updated 12/24/22  Pt will be independent with HEP.    Baseline: Goal status: MET 09/02/22   2.  Pt will be independent with the knack, urge suppression technique, and double voiding in order to improve bladder habits and decrease urinary incontinence.    Baseline:  Goal status: MET 10/13/22   3.  Pt will be independent with diaphragmatic breathing and down training activities in order to improve pelvic floor relaxation.   Baseline: Goal status: MET 10/13/22   LONG TERM GOALS: Target date: 10/12/22 - updated 09/02/22 - updated 10/13/22 with new goal 12/22/22 - updated 12/24/22    Pt will be independent with HEP.    Baseline:  Goal status: MET 12/24/22   2.  Pt will demonstrate normal pelvic floor muscle tone and A/ROM, able to achieve 4/5 strength with contractions and 10 sec endurance, in order to provide appropriate lumbopelvic support in functional activities.    Baseline:  Goal status: DISCHARGED 12/24/22   3.  Pt will report no leaks with laughing, coughing, sneezing in order to improve comfort with interpersonal relationships and community activities.    Baseline:  Goal status: MET 12/24/22   4.  Pt will report 0/10 pain with vaginal penetration in order to improve intimate relationship with partner.     Baseline: Pt does not feel like she is in a place to work on this right now Goal status: DISCHARGED       PLAN:   PT FREQUENCY: -   PT DURATION: 1-   PLANNED INTERVENTIONS: - PLAN FOR NEXT SESSION: DC  PHYSICAL THERAPY DISCHARGE SUMMARY  Visits from Start of Care: 9  Current functional level related to goals / functional outcomes: Independent   Remaining deficits: See above   Education / Equipment: HEP   Patient  agrees to discharge. Patient goals were partially met. Patient is being discharged due to the patient's request.   Julio Alm, PT, DPT06/13/242:46 PM

## 2023-02-02 ENCOUNTER — Ambulatory Visit (INDEPENDENT_AMBULATORY_CARE_PROVIDER_SITE_OTHER): Payer: PPO | Admitting: Podiatry

## 2023-02-02 ENCOUNTER — Ambulatory Visit: Payer: PPO

## 2023-02-02 ENCOUNTER — Ambulatory Visit (INDEPENDENT_AMBULATORY_CARE_PROVIDER_SITE_OTHER): Payer: PPO

## 2023-02-02 DIAGNOSIS — B351 Tinea unguium: Secondary | ICD-10-CM

## 2023-02-02 DIAGNOSIS — M2041 Other hammer toe(s) (acquired), right foot: Secondary | ICD-10-CM | POA: Diagnosis not present

## 2023-02-02 DIAGNOSIS — G629 Polyneuropathy, unspecified: Secondary | ICD-10-CM | POA: Diagnosis not present

## 2023-02-02 DIAGNOSIS — M2042 Other hammer toe(s) (acquired), left foot: Secondary | ICD-10-CM

## 2023-02-02 DIAGNOSIS — L603 Nail dystrophy: Secondary | ICD-10-CM | POA: Diagnosis not present

## 2023-02-03 NOTE — Progress Notes (Signed)
Subjective:  Patient ID: Kristy Sherman, female    DOB: 15-Jan-1951,  MRN: [id number] HPI Chief Complaint  Patient presents with   Hammer Toe    Pt came in today for fungus second toe and big toe and numbness in both feet. Bilateral hammertoes,Pt denies pain only numbness she sometimes try to put on some cream but it does not seem to be helping.    72 y.o. female presents with the above complaint.   ROS: Denies fever chills nausea vomiting muscle aches pains calf pain back pain chest pain shortness of breath  Past Medical History:  Diagnosis Date   ANKLE PAIN, LEFT 10/17/2007   Qualifier: Diagnosis of  By: Janit Bern     Hyperlipidemia    Hypertension    Obesity    Sjoegren syndrome    Past Surgical History:  Procedure Laterality Date   ABDOMINAL HYSTERECTOMY     with HIPEC   ABDOMINAL SURGERY     CESAREAN SECTION  1980, 1982, 1986   CHOLECYSTECTOMY     LAPAROSCOPIC APPENDECTOMY N/A 11/11/2018   Procedure: APPENDECTOMY LAPAROSCOPIC;  Surgeon: Karie Soda, MD;  Location: [facility];  Service: General;  Laterality: N/A;   SPLENECTOMY, TOTAL      Current Outpatient Medications:    amLODipine (NORVASC) 10 MG tablet, Take 10 mg by mouth daily., Disp: , Rfl:    Aspirin (BAYER LOW DOSE PO), Take by mouth. (Patient not taking: Reported on 07/20/2022), Disp: , Rfl:    atorvastatin (LIPITOR) 10 MG tablet, Take 10 mg by mouth daily. (Patient not taking: Reported on 07/20/2022), Disp: , Rfl:    Cholecalciferol (VITAMIN D3 PO), Take 1,000 Units by mouth daily., Disp: , Rfl:    hyoscyamine (LEVSIN SL) 0.125 MG SL tablet, Place 1 tablet (0.125 mg total) under the tongue every 4 (four) hours as needed for cramping (abdominal pain). (Patient not taking: Reported on 07/20/2022), Disp: 30 tablet, Rfl: 0   lidocaine (LIDODERM) 5 %, Place 1 patch onto the skin daily. Remove & Discard patch within 12 hours or as directed by MD (Patient not taking: Reported on 07/20/2022), Disp: 15 patch, Rfl: 0    ondansetron (ZOFRAN ODT) 4 MG disintegrating tablet, Take 1 tablet (4 mg total) by mouth every 8 (eight) hours as needed. (Patient not taking: Reported on 07/20/2022), Disp: 20 tablet, Rfl: 0   oxyCODONE-acetaminophen (PERCOCET/ROXICET) 5-325 MG tablet, Take 1 tablet by mouth every 6 (six) hours as needed for severe pain. (Patient not taking: Reported on 07/20/2022), Disp: 10 tablet, Rfl: 0   Pediatric Multivitamins-Iron (FLINTSTONES COMPLETE PO), Take by mouth daily. 2 chewable tablets PO daily, Disp: , Rfl:    promethazine (PHENERGAN) 25 MG suppository, Place 1 suppository (25 mg total) rectally every 6 (six) hours as needed for nausea or vomiting. (Patient not taking: Reported on 07/20/2022), Disp: 12 each, Rfl: 0   sucralfate (CARAFATE) 1 GM/10ML suspension, Take 10 mLs (1 g total) by mouth 4 (four) times daily -  with meals and at bedtime. (Patient not taking: Reported on 07/20/2022), Disp: 420 mL, Rfl: 0   traMADol (ULTRAM) 50 MG tablet, Take 1-2 tablets (50-100 mg total) by mouth every 6 (six) hours as needed for moderate pain or severe pain. (Patient not taking: Reported on 07/20/2022), Disp: 20 tablet, Rfl: 0   traZODone (DESYREL) 50 MG tablet, Take 50 mg by mouth at bedtime as needed for sleep., Disp: , Rfl:   No Known Allergies Review of Systems Objective:  There were no  vitals filed for this visit.  General: Well developed, nourished, in no acute distress, alert and oriented x3   Dermatological: Skin is warm, dry and supple bilateral. Nails x 10 are well maintained however they are thick yellow dystrophic clinically mycotic subungual debris.; remaining integument appears unremarkable at this time. There are no open sores, no preulcerative lesions, no rash or signs of infection present.  Vascular: Dorsalis Pedis artery and Posterior Tibial artery pedal pulses are 2/4 bilateral with immedate capillary fill time. Pedal hair growth present. No varicosities and no lower extremity edema present  bilateral.   Neruologic: Grossly intact via light touch bilateral. Vibratory intact via tuning fork bilateral. Protective threshold with Semmes Wienstein monofilament intact to all pedal sites bilateral. Patellar and Achilles deep tendon reflexes 2+ bilateral. No Babinski or clonus noted bilateral.  She does have some altered sensation to the forefoot and toes at the level of the metatarsal phalangeal joints or just proximal to that area.  This seems to be bilaterally symmetrical but no protective loss of sensation.  Musculoskeletal: No gross boney pedal deformities bilateral however she does have mild hammertoe deformities second right worse than the left with some osteoarthritic change. No pain, crepitus, or limitation noted with foot and ankle range of motion bilateral. Muscular strength 5/5 in all groups tested bilateral.  Gait: Unassisted, Nonantalgic.    Radiographs:  Radiographs taken today demonstrate osseously mature individual some generalized demineralization is noted return deformity noted second digit right foot Martell sign at the head of the metatarsal first left possibly associated with gout  Assessment & Plan:   Assessment: Idiopathic neuropathy distal forefoot bilateral.  Arthritic hammertoe deformity second digit right.  Nail dystrophy cannot rule out onychomycosis.  Plan: Took samples of the skin and nails today to be sent for pathologic evaluation we will follow-up with her in 1 month     Wiley Flicker T. [place], [place]

## 2023-02-11 DIAGNOSIS — I10 Essential (primary) hypertension: Secondary | ICD-10-CM | POA: Diagnosis not present

## 2023-02-11 DIAGNOSIS — E559 Vitamin D deficiency, unspecified: Secondary | ICD-10-CM | POA: Diagnosis not present

## 2023-02-11 DIAGNOSIS — E785 Hyperlipidemia, unspecified: Secondary | ICD-10-CM | POA: Diagnosis not present

## 2023-03-09 ENCOUNTER — Ambulatory Visit: Payer: PPO | Admitting: Podiatry

## 2023-03-09 ENCOUNTER — Encounter: Payer: Self-pay | Admitting: Podiatry

## 2023-03-09 DIAGNOSIS — B351 Tinea unguium: Secondary | ICD-10-CM

## 2023-03-09 NOTE — Progress Notes (Signed)
She presents today for follow-up of her nail pathology.  Objective: Physical exam unchanged pathology report does demonstrate a fungus and nail dystrophy.  Assessment: Trichophyton rubrum nail dystrophy.  Plan: Discussed topical therapy oral therapy and laser therapy.  At this point she would like to go home and think about it she is going to let us know.  She has recently had blood work done which demonstrates all good values.  So we will be able to go ahead and write her a prescription for 30 days should she notify us.

## 2023-03-22 DIAGNOSIS — C181 Malignant neoplasm of appendix: Secondary | ICD-10-CM | POA: Diagnosis not present

## 2023-03-22 DIAGNOSIS — Z932 Ileostomy status: Secondary | ICD-10-CM | POA: Diagnosis not present

## 2023-03-22 DIAGNOSIS — K5909 Other constipation: Secondary | ICD-10-CM | POA: Diagnosis not present

## 2023-03-22 DIAGNOSIS — Z79899 Other long term (current) drug therapy: Secondary | ICD-10-CM | POA: Diagnosis not present

## 2023-03-22 DIAGNOSIS — Z9889 Other specified postprocedural states: Secondary | ICD-10-CM | POA: Diagnosis not present

## 2023-03-22 DIAGNOSIS — D373 Neoplasm of uncertain behavior of appendix: Secondary | ICD-10-CM | POA: Diagnosis not present

## 2023-06-15 DIAGNOSIS — E785 Hyperlipidemia, unspecified: Secondary | ICD-10-CM | POA: Diagnosis not present

## 2023-06-15 DIAGNOSIS — Z1331 Encounter for screening for depression: Secondary | ICD-10-CM | POA: Diagnosis not present

## 2023-06-15 DIAGNOSIS — Z0000 Encounter for general adult medical examination without abnormal findings: Secondary | ICD-10-CM | POA: Diagnosis not present

## 2023-06-15 DIAGNOSIS — N1831 Chronic kidney disease, stage 3a: Secondary | ICD-10-CM | POA: Diagnosis not present

## 2023-06-15 DIAGNOSIS — Z9081 Acquired absence of spleen: Secondary | ICD-10-CM | POA: Diagnosis not present

## 2023-06-15 DIAGNOSIS — E559 Vitamin D deficiency, unspecified: Secondary | ICD-10-CM | POA: Diagnosis not present

## 2023-06-15 DIAGNOSIS — M8588 Other specified disorders of bone density and structure, other site: Secondary | ICD-10-CM | POA: Diagnosis not present

## 2023-06-15 DIAGNOSIS — M3500 Sicca syndrome, unspecified: Secondary | ICD-10-CM | POA: Diagnosis not present

## 2023-06-25 DIAGNOSIS — N952 Postmenopausal atrophic vaginitis: Secondary | ICD-10-CM | POA: Diagnosis not present

## 2023-06-25 DIAGNOSIS — Z124 Encounter for screening for malignant neoplasm of cervix: Secondary | ICD-10-CM | POA: Diagnosis not present

## 2023-06-25 DIAGNOSIS — Z1231 Encounter for screening mammogram for malignant neoplasm of breast: Secondary | ICD-10-CM | POA: Diagnosis not present

## 2023-06-25 DIAGNOSIS — M8589 Other specified disorders of bone density and structure, multiple sites: Secondary | ICD-10-CM | POA: Diagnosis not present

## 2023-06-25 DIAGNOSIS — N895 Stricture and atresia of vagina: Secondary | ICD-10-CM | POA: Diagnosis not present

## 2023-06-25 DIAGNOSIS — Z01411 Encounter for gynecological examination (general) (routine) with abnormal findings: Secondary | ICD-10-CM | POA: Diagnosis not present

## 2023-06-29 ENCOUNTER — Other Ambulatory Visit: Payer: Self-pay | Admitting: Obstetrics & Gynecology

## 2023-06-29 DIAGNOSIS — M8589 Other specified disorders of bone density and structure, multiple sites: Secondary | ICD-10-CM

## 2023-09-27 DIAGNOSIS — C181 Malignant neoplasm of appendix: Secondary | ICD-10-CM | POA: Diagnosis not present

## 2023-09-27 DIAGNOSIS — N2889 Other specified disorders of kidney and ureter: Secondary | ICD-10-CM | POA: Diagnosis not present

## 2023-12-13 DIAGNOSIS — C786 Secondary malignant neoplasm of retroperitoneum and peritoneum: Secondary | ICD-10-CM | POA: Diagnosis not present

## 2023-12-13 DIAGNOSIS — D373 Neoplasm of uncertain behavior of appendix: Secondary | ICD-10-CM | POA: Diagnosis not present

## 2023-12-13 DIAGNOSIS — I10 Essential (primary) hypertension: Secondary | ICD-10-CM | POA: Diagnosis not present

## 2023-12-13 DIAGNOSIS — M85859 Other specified disorders of bone density and structure, unspecified thigh: Secondary | ICD-10-CM | POA: Diagnosis not present

## 2023-12-13 DIAGNOSIS — E785 Hyperlipidemia, unspecified: Secondary | ICD-10-CM | POA: Diagnosis not present

## 2023-12-13 DIAGNOSIS — M3500 Sicca syndrome, unspecified: Secondary | ICD-10-CM | POA: Diagnosis not present

## 2024-01-06 DIAGNOSIS — N1831 Chronic kidney disease, stage 3a: Secondary | ICD-10-CM | POA: Diagnosis not present

## 2024-01-06 DIAGNOSIS — I10 Essential (primary) hypertension: Secondary | ICD-10-CM | POA: Diagnosis not present

## 2024-02-04 DIAGNOSIS — N1831 Chronic kidney disease, stage 3a: Secondary | ICD-10-CM | POA: Diagnosis not present

## 2024-02-04 DIAGNOSIS — I10 Essential (primary) hypertension: Secondary | ICD-10-CM | POA: Diagnosis not present

## 2024-02-10 DIAGNOSIS — N1831 Chronic kidney disease, stage 3a: Secondary | ICD-10-CM | POA: Diagnosis not present

## 2024-02-10 DIAGNOSIS — I10 Essential (primary) hypertension: Secondary | ICD-10-CM | POA: Diagnosis not present

## 2024-02-10 DIAGNOSIS — E785 Hyperlipidemia, unspecified: Secondary | ICD-10-CM | POA: Diagnosis not present

## 2024-02-22 ENCOUNTER — Other Ambulatory Visit: Payer: PPO

## 2024-02-29 ENCOUNTER — Other Ambulatory Visit (HOSPITAL_BASED_OUTPATIENT_CLINIC_OR_DEPARTMENT_OTHER)

## 2024-02-29 ENCOUNTER — Encounter (HOSPITAL_BASED_OUTPATIENT_CLINIC_OR_DEPARTMENT_OTHER): Payer: Self-pay

## 2024-03-05 DIAGNOSIS — I10 Essential (primary) hypertension: Secondary | ICD-10-CM | POA: Diagnosis not present

## 2024-03-05 DIAGNOSIS — N1831 Chronic kidney disease, stage 3a: Secondary | ICD-10-CM | POA: Diagnosis not present

## 2024-03-12 DIAGNOSIS — E785 Hyperlipidemia, unspecified: Secondary | ICD-10-CM | POA: Diagnosis not present

## 2024-03-12 DIAGNOSIS — N1831 Chronic kidney disease, stage 3a: Secondary | ICD-10-CM | POA: Diagnosis not present

## 2024-03-12 DIAGNOSIS — I10 Essential (primary) hypertension: Secondary | ICD-10-CM | POA: Diagnosis not present

## 2024-03-14 DIAGNOSIS — H6980 Other specified disorders of Eustachian tube, unspecified ear: Secondary | ICD-10-CM | POA: Diagnosis not present

## 2024-03-14 DIAGNOSIS — H6690 Otitis media, unspecified, unspecified ear: Secondary | ICD-10-CM | POA: Diagnosis not present

## 2024-03-14 DIAGNOSIS — H6121 Impacted cerumen, right ear: Secondary | ICD-10-CM | POA: Diagnosis not present

## 2024-03-29 DIAGNOSIS — R7989 Other specified abnormal findings of blood chemistry: Secondary | ICD-10-CM | POA: Diagnosis not present

## 2024-03-29 DIAGNOSIS — K5900 Constipation, unspecified: Secondary | ICD-10-CM | POA: Diagnosis not present

## 2024-03-29 DIAGNOSIS — C181 Malignant neoplasm of appendix: Secondary | ICD-10-CM | POA: Diagnosis not present

## 2024-03-29 DIAGNOSIS — K581 Irritable bowel syndrome with constipation: Secondary | ICD-10-CM | POA: Diagnosis not present

## 2024-03-29 DIAGNOSIS — N3289 Other specified disorders of bladder: Secondary | ICD-10-CM | POA: Diagnosis not present

## 2024-04-04 DIAGNOSIS — N1831 Chronic kidney disease, stage 3a: Secondary | ICD-10-CM | POA: Diagnosis not present

## 2024-04-04 DIAGNOSIS — I10 Essential (primary) hypertension: Secondary | ICD-10-CM | POA: Diagnosis not present

## 2024-04-11 DIAGNOSIS — E785 Hyperlipidemia, unspecified: Secondary | ICD-10-CM | POA: Diagnosis not present

## 2024-04-11 DIAGNOSIS — N1831 Chronic kidney disease, stage 3a: Secondary | ICD-10-CM | POA: Diagnosis not present

## 2024-04-11 DIAGNOSIS — I10 Essential (primary) hypertension: Secondary | ICD-10-CM | POA: Diagnosis not present

## 2024-05-04 DIAGNOSIS — I10 Essential (primary) hypertension: Secondary | ICD-10-CM | POA: Diagnosis not present

## 2024-05-04 DIAGNOSIS — N1831 Chronic kidney disease, stage 3a: Secondary | ICD-10-CM | POA: Diagnosis not present

## 2024-05-12 DIAGNOSIS — N1831 Chronic kidney disease, stage 3a: Secondary | ICD-10-CM | POA: Diagnosis not present

## 2024-05-12 DIAGNOSIS — I10 Essential (primary) hypertension: Secondary | ICD-10-CM | POA: Diagnosis not present

## 2024-05-12 DIAGNOSIS — E785 Hyperlipidemia, unspecified: Secondary | ICD-10-CM | POA: Diagnosis not present

## 2024-06-03 DIAGNOSIS — I10 Essential (primary) hypertension: Secondary | ICD-10-CM | POA: Diagnosis not present

## 2024-06-03 DIAGNOSIS — N1831 Chronic kidney disease, stage 3a: Secondary | ICD-10-CM | POA: Diagnosis not present

## 2024-06-11 DIAGNOSIS — N1831 Chronic kidney disease, stage 3a: Secondary | ICD-10-CM | POA: Diagnosis not present

## 2024-06-11 DIAGNOSIS — I10 Essential (primary) hypertension: Secondary | ICD-10-CM | POA: Diagnosis not present

## 2024-06-11 DIAGNOSIS — E785 Hyperlipidemia, unspecified: Secondary | ICD-10-CM | POA: Diagnosis not present

## 2024-08-18 ENCOUNTER — Other Ambulatory Visit (HOSPITAL_COMMUNITY): Payer: Self-pay | Admitting: Cardiology

## 2024-08-18 DIAGNOSIS — Z8249 Family history of ischemic heart disease and other diseases of the circulatory system: Secondary | ICD-10-CM

## 2024-08-22 ENCOUNTER — Other Ambulatory Visit (HOSPITAL_BASED_OUTPATIENT_CLINIC_OR_DEPARTMENT_OTHER)
